# Patient Record
Sex: Female | Born: 1935 | Race: White | Hispanic: No | Marital: Married | Smoking: Never smoker
Health system: Southern US, Community
[De-identification: ages and names within clinical notes are randomized; demographics above are authoritative.]

## PROBLEM LIST (undated history)

## (undated) DIAGNOSIS — G20 Parkinson's disease: Secondary | ICD-10-CM

## (undated) DIAGNOSIS — I82409 Acute embolism and thrombosis of unspecified deep veins of unspecified lower extremity: Secondary | ICD-10-CM

## (undated) DIAGNOSIS — K469 Unspecified abdominal hernia without obstruction or gangrene: Secondary | ICD-10-CM

## (undated) DIAGNOSIS — I10 Essential (primary) hypertension: Secondary | ICD-10-CM

## (undated) DIAGNOSIS — H3500 Unspecified background retinopathy: Secondary | ICD-10-CM

## (undated) DIAGNOSIS — E119 Type 2 diabetes mellitus without complications: Secondary | ICD-10-CM

## (undated) DIAGNOSIS — I4891 Unspecified atrial fibrillation: Secondary | ICD-10-CM

## (undated) DIAGNOSIS — I779 Disorder of arteries and arterioles, unspecified: Secondary | ICD-10-CM

## (undated) DIAGNOSIS — E039 Hypothyroidism, unspecified: Secondary | ICD-10-CM

## (undated) DIAGNOSIS — E7800 Pure hypercholesterolemia, unspecified: Secondary | ICD-10-CM

## (undated) DIAGNOSIS — C801 Malignant (primary) neoplasm, unspecified: Secondary | ICD-10-CM

## (undated) DIAGNOSIS — F0390 Unspecified dementia without behavioral disturbance: Secondary | ICD-10-CM

## (undated) DIAGNOSIS — I739 Peripheral vascular disease, unspecified: Secondary | ICD-10-CM

## (undated) DIAGNOSIS — G20A1 Parkinson's disease without dyskinesia, without mention of fluctuations: Secondary | ICD-10-CM

## (undated) HISTORY — DX: Unspecified abdominal hernia without obstruction or gangrene: K46.9

## (undated) HISTORY — DX: Pure hypercholesterolemia, unspecified: E78.00

## (undated) HISTORY — DX: Malignant (primary) neoplasm, unspecified: C80.1

## (undated) HISTORY — DX: Type 2 diabetes mellitus without complications: E11.9

## (undated) HISTORY — DX: Parkinson's disease: G20

## (undated) HISTORY — DX: Unspecified dementia, unspecified severity, without behavioral disturbance, psychotic disturbance, mood disturbance, and anxiety: F03.90

## (undated) HISTORY — DX: Unspecified background retinopathy: H35.00

## (undated) HISTORY — DX: Essential (primary) hypertension: I10

## (undated) HISTORY — DX: Unspecified atrial fibrillation: I48.91

## (undated) HISTORY — DX: Hypothyroidism, unspecified: E03.9

## (undated) HISTORY — DX: Acute embolism and thrombosis of unspecified deep veins of unspecified lower extremity: I82.409

## (undated) HISTORY — DX: Disorder of arteries and arterioles, unspecified: I77.9

## (undated) HISTORY — DX: Parkinson's disease without dyskinesia, without mention of fluctuations: G20.A1

## (undated) HISTORY — DX: Peripheral vascular disease, unspecified: I73.9

## (undated) HISTORY — PX: EYE SURGERY: SHX253

---
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
## 1974-06-13 HISTORY — PX: CARPAL TUNNEL RELEASE: SHX101

## 1996-06-13 DIAGNOSIS — I82409 Acute embolism and thrombosis of unspecified deep veins of unspecified lower extremity: Secondary | ICD-10-CM

## 1996-06-13 DIAGNOSIS — C801 Malignant (primary) neoplasm, unspecified: Secondary | ICD-10-CM

## 1996-06-13 HISTORY — DX: Malignant (primary) neoplasm, unspecified: C80.1

## 1996-06-13 HISTORY — PX: BREAST SURGERY: SHX581

## 1996-06-13 HISTORY — DX: Acute embolism and thrombosis of unspecified deep veins of unspecified lower extremity: I82.409

## 2005-06-13 HISTORY — PX: CHOLECYSTECTOMY: SHX55

## 2005-09-09 ENCOUNTER — Other Ambulatory Visit: Payer: Self-pay

## 2005-09-10 ENCOUNTER — Inpatient Hospital Stay: Payer: Self-pay | Admitting: Internal Medicine

## 2005-10-06 ENCOUNTER — Ambulatory Visit: Payer: Self-pay | Admitting: Surgery

## 2005-11-08 ENCOUNTER — Ambulatory Visit: Payer: Self-pay | Admitting: Oncology

## 2005-11-22 LAB — CBC WITH DIFFERENTIAL (CANCER CENTER ONLY)
BASO%: 1 % (ref 0.0–2.0)
LYMPH%: 24.7 % (ref 14.0–48.0)
MCV: 90 fL (ref 81–101)
MONO#: 0.9 10*3/uL (ref 0.1–0.9)
MONO%: 9.5 % (ref 0.0–13.0)
Platelets: 204 10*3/uL (ref 145–400)
RDW: 12.8 % (ref 10.5–14.6)
WBC: 9.4 10*3/uL (ref 3.9–10.0)

## 2005-11-22 LAB — PROTIME-INR (CHCC SATELLITE): INR: 1 — ABNORMAL LOW (ref 2.0–3.5)

## 2005-11-25 LAB — COMPREHENSIVE METABOLIC PANEL
ALT: 17 U/L (ref 0–40)
AST: 21 U/L (ref 0–37)
Alkaline Phosphatase: 79 U/L (ref 39–117)
Creatinine, Ser: 0.9 mg/dL (ref 0.40–1.20)
Total Bilirubin: 0.6 mg/dL (ref 0.3–1.2)

## 2005-11-25 LAB — HYPERCOAGULABLE PANEL, COMPREHENSIVE
Anticardiolipin IgA: 22 [APL'U] (ref <13)
Beta-2-Glycoprotein I IgA: 14 U/mL (ref <10)
DRVVT 1:1 Mix: 39.5 secs (ref 26.75–42.95)
DRVVT: 44.7 secs — ABNORMAL HIGH (ref 26.75–42.95)
Protein C, Total: 83 % (ref 63–153)
Protein S Activity: 72 % — ABNORMAL LOW (ref 81–180)

## 2005-11-25 LAB — PROTIME-INR (CHCC SATELLITE): INR: 1.4 — ABNORMAL LOW (ref 2.0–3.5)

## 2005-11-28 LAB — PROTIME-INR (CHCC SATELLITE)

## 2005-11-30 LAB — PROTHROMBIN TIME: Prothrombin Time: 31.4 seconds — ABNORMAL HIGH (ref 11.6–15.2)

## 2005-12-05 LAB — PROTIME-INR (CHCC SATELLITE): INR: 3 (ref 2.0–3.5)

## 2005-12-12 LAB — PROTIME-INR (CHCC SATELLITE)

## 2005-12-26 ENCOUNTER — Ambulatory Visit: Payer: Self-pay | Admitting: Oncology

## 2005-12-27 ENCOUNTER — Ambulatory Visit (HOSPITAL_COMMUNITY): Admission: RE | Admit: 2005-12-27 | Discharge: 2005-12-27 | Payer: Self-pay | Admitting: Oncology

## 2005-12-27 LAB — PROTIME-INR (CHCC SATELLITE)

## 2006-01-05 LAB — CBC WITH DIFFERENTIAL (CANCER CENTER ONLY)
Eosinophils Absolute: 0.6 10*3/uL — ABNORMAL HIGH (ref 0.0–0.5)
LYMPH%: 30.3 % (ref 14.0–48.0)
MCH: 30 pg (ref 26.0–34.0)
MCHC: 32.9 g/dL (ref 32.0–36.0)
MCV: 91 fL (ref 81–101)
MONO%: 9.7 % (ref 0.0–13.0)
Platelets: 210 10*3/uL (ref 145–400)
RDW: 13 % (ref 10.5–14.6)

## 2006-01-05 LAB — PROTIME-INR (CHCC SATELLITE): Protime: 24.3 Seconds — ABNORMAL HIGH (ref 10.6–13.4)

## 2006-01-10 LAB — PROTIME-INR (CHCC SATELLITE)
INR: 3.7 — ABNORMAL HIGH (ref 2.0–3.5)
Protime: 23.2 Seconds — ABNORMAL HIGH (ref 10.6–13.4)

## 2006-01-17 LAB — PROTIME-INR (CHCC SATELLITE)
INR: 1.4 — ABNORMAL LOW (ref 2.0–3.5)
Protime: 14.4 Seconds — ABNORMAL HIGH (ref 10.6–13.4)

## 2006-01-24 LAB — PROTIME-INR (CHCC SATELLITE): INR: 1.9 — ABNORMAL LOW (ref 2.0–3.5)

## 2006-01-31 LAB — PROTIME-INR (CHCC SATELLITE)

## 2006-02-09 ENCOUNTER — Ambulatory Visit: Payer: Self-pay | Admitting: Oncology

## 2006-02-14 LAB — PROTIME-INR (CHCC SATELLITE): INR: 2.4 (ref 2.0–3.5)

## 2006-03-07 LAB — PROTIME-INR (CHCC SATELLITE)
INR: 2.5 (ref 2.0–3.5)
Protime: 30 Seconds — ABNORMAL HIGH (ref 10.6–13.4)

## 2006-03-31 ENCOUNTER — Ambulatory Visit: Payer: Self-pay | Admitting: Oncology

## 2006-04-04 LAB — PROTIME-INR (CHCC SATELLITE): Protime: 33.6 Seconds — ABNORMAL HIGH (ref 10.6–13.4)

## 2006-05-02 LAB — PROTIME-INR (CHCC SATELLITE): Protime: 24 Seconds — ABNORMAL HIGH (ref 10.6–13.4)

## 2006-05-25 ENCOUNTER — Ambulatory Visit: Payer: Self-pay | Admitting: Oncology

## 2006-05-30 LAB — PROTIME-INR (CHCC SATELLITE): Protime: 25.2 Seconds — ABNORMAL HIGH (ref 10.6–13.4)

## 2006-06-27 LAB — PROTIME-INR (CHCC SATELLITE)

## 2006-08-02 ENCOUNTER — Ambulatory Visit: Payer: Self-pay | Admitting: Oncology

## 2006-08-03 LAB — CBC WITH DIFFERENTIAL (CANCER CENTER ONLY)
BASO%: 0.7 % (ref 0.0–2.0)
HCT: 38.8 % (ref 34.8–46.6)
LYMPH#: 2 10*3/uL (ref 0.9–3.3)
MONO#: 0.7 10*3/uL (ref 0.1–0.9)
Platelets: 193 10*3/uL (ref 145–400)
RDW: 12 % (ref 10.5–14.6)
WBC: 7.4 10*3/uL (ref 3.9–10.0)

## 2006-08-03 LAB — CANCER ANTIGEN 27.29: CA 27.29: 13 U/mL (ref 0–39)

## 2006-08-03 LAB — COMPREHENSIVE METABOLIC PANEL
ALT: 12 U/L (ref 0–35)
CO2: 23 mEq/L (ref 19–32)
Chloride: 107 mEq/L (ref 96–112)
Sodium: 140 mEq/L (ref 135–145)
Total Bilirubin: 0.3 mg/dL (ref 0.3–1.2)
Total Protein: 6.8 g/dL (ref 6.0–8.3)

## 2006-08-21 ENCOUNTER — Ambulatory Visit: Payer: Self-pay | Admitting: Oncology

## 2006-09-01 LAB — PROTHROMBIN TIME: Prothrombin Time: 53.9 seconds — ABNORMAL HIGH (ref 11.6–15.2)

## 2006-09-04 LAB — PROTIME-INR (CHCC SATELLITE)

## 2006-09-11 LAB — PROTIME-INR (CHCC SATELLITE)

## 2006-09-28 ENCOUNTER — Ambulatory Visit: Payer: Self-pay | Admitting: Oncology

## 2006-09-29 LAB — PROTIME-INR (CHCC SATELLITE): Protime: 38.4 Seconds — ABNORMAL HIGH (ref 10.6–13.4)

## 2006-10-13 LAB — PROTIME-INR (CHCC SATELLITE): Protime: 27.6 Seconds — ABNORMAL HIGH (ref 10.6–13.4)

## 2006-10-27 LAB — PROTIME-INR (CHCC SATELLITE): INR: 1.9 — ABNORMAL LOW (ref 2.0–3.5)

## 2006-11-10 LAB — PROTIME-INR (CHCC SATELLITE)
INR: 2 (ref 2.0–3.5)
Protime: 24 Seconds — ABNORMAL HIGH (ref 10.6–13.4)

## 2006-11-23 ENCOUNTER — Ambulatory Visit: Payer: Self-pay | Admitting: Oncology

## 2006-11-24 LAB — PROTIME-INR (CHCC SATELLITE)

## 2007-01-18 ENCOUNTER — Ambulatory Visit: Payer: Self-pay | Admitting: Oncology

## 2007-01-22 LAB — COMPREHENSIVE METABOLIC PANEL
Alkaline Phosphatase: 68 U/L (ref 39–117)
BUN: 19 mg/dL (ref 6–23)
CO2: 21 mEq/L (ref 19–32)
Glucose, Bld: 131 mg/dL — ABNORMAL HIGH (ref 70–99)
Total Bilirubin: 0.3 mg/dL (ref 0.3–1.2)

## 2007-01-22 LAB — CBC WITH DIFFERENTIAL (CANCER CENTER ONLY)
BASO#: 0.1 10*3/uL (ref 0.0–0.2)
HCT: 36.3 % (ref 34.8–46.6)
HGB: 12 g/dL (ref 11.6–15.9)
LYMPH#: 2.3 10*3/uL (ref 0.9–3.3)
MONO#: 0.7 10*3/uL (ref 0.1–0.9)
NEUT%: 54.5 % (ref 39.6–80.0)
RBC: 3.97 10*6/uL (ref 3.70–5.32)
WBC: 8.2 10*3/uL (ref 3.9–10.0)

## 2007-01-22 LAB — PROTIME-INR (CHCC SATELLITE)
INR: 2.4 (ref 2.0–3.5)
Protime: 28.8 Seconds — ABNORMAL HIGH (ref 10.6–13.4)

## 2007-01-22 LAB — LACTATE DEHYDROGENASE: LDH: 148 U/L (ref 94–250)

## 2007-01-22 LAB — CANCER ANTIGEN 27.29: CA 27.29: 7 U/mL (ref 0–39)

## 2007-03-02 ENCOUNTER — Ambulatory Visit: Payer: Self-pay | Admitting: Oncology

## 2007-03-06 LAB — PROTIME-INR (CHCC SATELLITE): Protime: 22.8 Seconds — ABNORMAL HIGH (ref 10.6–13.4)

## 2007-03-20 LAB — PROTIME-INR (CHCC SATELLITE): Protime: 33.6 Seconds — ABNORMAL HIGH (ref 10.6–13.4)

## 2007-04-19 ENCOUNTER — Ambulatory Visit: Payer: Self-pay | Admitting: Oncology

## 2007-04-20 LAB — PROTIME-INR (CHCC SATELLITE)
INR: 3.2 (ref 2.0–3.5)
Protime: 38.4 Seconds — ABNORMAL HIGH (ref 10.6–13.4)

## 2007-05-03 LAB — PROTIME-INR (CHCC SATELLITE)
INR: 2.7 (ref 2.0–3.5)
Protime: 32.4 Seconds — ABNORMAL HIGH (ref 10.6–13.4)

## 2007-05-28 LAB — PROTIME-INR (CHCC SATELLITE): INR: 2.8 (ref 2.0–3.5)

## 2007-06-21 ENCOUNTER — Ambulatory Visit: Payer: Self-pay | Admitting: Oncology

## 2007-07-02 LAB — PROTIME-INR (CHCC SATELLITE)

## 2007-07-06 LAB — PROTIME-INR (CHCC SATELLITE): Protime: 18 Seconds — ABNORMAL HIGH (ref 10.6–13.4)

## 2007-07-10 LAB — CBC WITH DIFFERENTIAL (CANCER CENTER ONLY)
BASO#: 0.1 10*3/uL (ref 0.0–0.2)
EOS%: 10.1 % — ABNORMAL HIGH (ref 0.0–7.0)
Eosinophils Absolute: 1 10*3/uL — ABNORMAL HIGH (ref 0.0–0.5)
HCT: 36 % (ref 34.8–46.6)
HGB: 12.1 g/dL (ref 11.6–15.9)
LYMPH#: 3 10*3/uL (ref 0.9–3.3)
MCH: 30.6 pg (ref 26.0–34.0)
MCHC: 33.7 g/dL (ref 32.0–36.0)
NEUT%: 47.6 % (ref 39.6–80.0)

## 2007-07-10 LAB — PROTIME-INR (CHCC SATELLITE)
INR: 2 (ref 2.0–3.5)
Protime: 24 Seconds — ABNORMAL HIGH (ref 10.6–13.4)

## 2007-07-31 LAB — PROTIME-INR (CHCC SATELLITE): INR: 3.6 — ABNORMAL HIGH (ref 2.0–3.5)

## 2007-08-10 ENCOUNTER — Ambulatory Visit: Payer: Self-pay | Admitting: Oncology

## 2007-08-14 LAB — PROTIME-INR (CHCC SATELLITE)
INR: 2.1 (ref 2.0–3.5)
Protime: 25.2 Seconds — ABNORMAL HIGH (ref 10.6–13.4)

## 2007-08-27 ENCOUNTER — Ambulatory Visit: Payer: Self-pay | Admitting: Oncology

## 2007-10-05 ENCOUNTER — Ambulatory Visit: Payer: Self-pay | Admitting: Oncology

## 2007-10-09 LAB — PROTIME-INR (CHCC SATELLITE)
INR: 2.6 (ref 2.0–3.5)
Protime: 31.2 Seconds — ABNORMAL HIGH (ref 10.6–13.4)

## 2007-11-09 LAB — PROTIME-INR (CHCC SATELLITE)

## 2007-11-21 ENCOUNTER — Ambulatory Visit: Payer: Self-pay | Admitting: Oncology

## 2007-12-21 LAB — PROTIME-INR (CHCC SATELLITE): Protime: 26.4 Seconds — ABNORMAL HIGH (ref 10.6–13.4)

## 2007-12-26 ENCOUNTER — Emergency Department: Payer: Self-pay | Admitting: Internal Medicine

## 2008-01-03 ENCOUNTER — Ambulatory Visit: Payer: Self-pay | Admitting: Oncology

## 2008-01-07 LAB — CBC WITH DIFFERENTIAL (CANCER CENTER ONLY)
BASO#: 0.1 10*3/uL (ref 0.0–0.2)
Eosinophils Absolute: 0.4 10*3/uL (ref 0.0–0.5)
HCT: 36.8 % (ref 34.8–46.6)
HGB: 12.4 g/dL (ref 11.6–15.9)
LYMPH#: 2.4 10*3/uL (ref 0.9–3.3)
MCH: 29.6 pg (ref 26.0–34.0)
NEUT#: 3.8 10*3/uL (ref 1.5–6.5)
NEUT%: 52.1 % (ref 39.6–80.0)
RBC: 4.19 10*6/uL (ref 3.70–5.32)

## 2008-01-07 LAB — COMPREHENSIVE METABOLIC PANEL
ALT: 20 U/L (ref 0–35)
AST: 22 U/L (ref 0–37)
Albumin: 3.7 g/dL (ref 3.5–5.2)
Alkaline Phosphatase: 54 U/L (ref 39–117)
BUN: 23 mg/dL (ref 6–23)
Calcium: 9 mg/dL (ref 8.4–10.5)
Chloride: 101 mEq/L (ref 96–112)
Potassium: 4.3 mEq/L (ref 3.5–5.3)
Sodium: 137 mEq/L (ref 135–145)
Total Protein: 7 g/dL (ref 6.0–8.3)

## 2008-01-07 LAB — PROTIME-INR (CHCC SATELLITE): INR: 2.9 (ref 2.0–3.5)

## 2008-02-07 LAB — CBC WITH DIFFERENTIAL (CANCER CENTER ONLY)
BASO#: 0.1 10*3/uL (ref 0.0–0.2)
Eosinophils Absolute: 0.7 10*3/uL — ABNORMAL HIGH (ref 0.0–0.5)
HGB: 12.3 g/dL (ref 11.6–15.9)
LYMPH%: 25 % (ref 14.0–48.0)
MCH: 29.9 pg (ref 26.0–34.0)
MCV: 89 fL (ref 81–101)
MONO%: 8.8 % (ref 0.0–13.0)
NEUT%: 59.5 % (ref 39.6–80.0)
RBC: 4.11 10*6/uL (ref 3.70–5.32)

## 2008-02-07 LAB — PROTIME-INR (CHCC SATELLITE): Protime: 25.2 Seconds — ABNORMAL HIGH (ref 10.6–13.4)

## 2008-03-04 ENCOUNTER — Ambulatory Visit: Payer: Self-pay | Admitting: Oncology

## 2008-03-06 LAB — PROTIME-INR (CHCC SATELLITE): INR: 2.4 (ref 2.0–3.5)

## 2008-04-03 LAB — PROTIME-INR (CHCC SATELLITE)

## 2008-04-30 ENCOUNTER — Ambulatory Visit: Payer: Self-pay | Admitting: Oncology

## 2008-05-01 LAB — PROTIME-INR (CHCC SATELLITE): INR: 2.5 (ref 2.0–3.5)

## 2008-05-29 LAB — PROTIME-INR (CHCC SATELLITE): Protime: 37.2 Seconds — ABNORMAL HIGH (ref 10.6–13.4)

## 2008-06-12 LAB — PROTIME-INR (CHCC SATELLITE)
INR: 2.8 (ref 2.0–3.5)
Protime: 33.6 Seconds — ABNORMAL HIGH (ref 10.6–13.4)

## 2008-06-24 ENCOUNTER — Ambulatory Visit: Payer: Self-pay | Admitting: Oncology

## 2008-08-19 ENCOUNTER — Ambulatory Visit: Payer: Self-pay | Admitting: Oncology

## 2008-08-21 LAB — CBC WITH DIFFERENTIAL (CANCER CENTER ONLY)
BASO#: 0.1 10*3/uL (ref 0.0–0.2)
BASO%: 1.1 % (ref 0.0–2.0)
EOS%: 10.3 % — ABNORMAL HIGH (ref 0.0–7.0)
HCT: 38.2 % (ref 34.8–46.6)
HGB: 12.9 g/dL (ref 11.6–15.9)
LYMPH#: 2.2 10*3/uL (ref 0.9–3.3)
MCHC: 33.6 g/dL (ref 32.0–36.0)
MONO#: 0.8 10*3/uL (ref 0.1–0.9)
NEUT#: 4.9 10*3/uL (ref 1.5–6.5)
WBC: 8.9 10*3/uL (ref 3.9–10.0)

## 2008-08-21 LAB — PROTIME-INR (CHCC SATELLITE)

## 2008-08-21 LAB — CMP (CANCER CENTER ONLY)
ALT(SGPT): 18 U/L (ref 10–47)
AST: 22 U/L (ref 11–38)
CO2: 29 mEq/L (ref 18–33)
Calcium: 9.1 mg/dL (ref 8.0–10.3)
Chloride: 98 mEq/L (ref 98–108)
Sodium: 139 mEq/L (ref 128–145)
Total Protein: 7.6 g/dL (ref 6.4–8.1)

## 2008-08-21 LAB — CANCER ANTIGEN 27.29: CA 27.29: 12 U/mL (ref 0–39)

## 2008-08-27 ENCOUNTER — Ambulatory Visit: Payer: Self-pay | Admitting: Oncology

## 2008-10-22 ENCOUNTER — Ambulatory Visit: Payer: Self-pay | Admitting: Oncology

## 2008-10-23 LAB — PROTIME-INR (CHCC SATELLITE)
INR: 2.2 (ref 2.0–3.5)
Protime: 26.4 Seconds — ABNORMAL HIGH (ref 10.6–13.4)

## 2008-11-20 LAB — PROTIME-INR (CHCC SATELLITE)
INR: 2.2 (ref 2.0–3.5)
Protime: 26.4 Seconds — ABNORMAL HIGH (ref 10.6–13.4)

## 2008-12-11 ENCOUNTER — Ambulatory Visit: Payer: Self-pay | Admitting: Oncology

## 2009-01-13 ENCOUNTER — Ambulatory Visit: Payer: Self-pay | Admitting: Oncology

## 2009-02-10 ENCOUNTER — Ambulatory Visit: Payer: Self-pay | Admitting: Oncology

## 2009-02-12 LAB — CBC WITH DIFFERENTIAL (CANCER CENTER ONLY)
BASO%: 0.9 % (ref 0.0–2.0)
EOS%: 7 % (ref 0.0–7.0)
HCT: 38.2 % (ref 34.8–46.6)
LYMPH#: 2.5 10*3/uL (ref 0.9–3.3)
MCHC: 32.7 g/dL (ref 32.0–36.0)
MONO#: 0.8 10*3/uL (ref 0.1–0.9)
NEUT#: 4.5 10*3/uL (ref 1.5–6.5)
NEUT%: 53.3 % (ref 39.6–80.0)
Platelets: 263 10*3/uL (ref 145–400)
RDW: 12.7 % (ref 10.5–14.6)
WBC: 8.4 10*3/uL (ref 3.9–10.0)

## 2009-02-12 LAB — CMP (CANCER CENTER ONLY)
ALT(SGPT): 20 U/L (ref 10–47)
AST: 21 U/L (ref 11–38)
Albumin: 3.2 g/dL — ABNORMAL LOW (ref 3.3–5.5)
BUN, Bld: 18 mg/dL (ref 7–22)
Calcium: 9 mg/dL (ref 8.0–10.3)
Chloride: 102 mEq/L (ref 98–108)
Potassium: 4.1 mEq/L (ref 3.3–4.7)
Total Protein: 7.4 g/dL (ref 6.4–8.1)

## 2009-02-12 LAB — PROTIME-INR (CHCC SATELLITE): INR: 2.8 (ref 2.0–3.5)

## 2009-03-11 ENCOUNTER — Ambulatory Visit: Payer: Self-pay | Admitting: Oncology

## 2009-03-13 LAB — PROTIME-INR (CHCC SATELLITE): Protime: 30 Seconds — ABNORMAL HIGH (ref 10.6–13.4)

## 2009-04-08 ENCOUNTER — Ambulatory Visit: Payer: Self-pay | Admitting: Oncology

## 2009-04-10 LAB — PROTIME-INR (CHCC SATELLITE): Protime: 36 Seconds — ABNORMAL HIGH (ref 10.6–13.4)

## 2009-05-04 ENCOUNTER — Ambulatory Visit: Payer: Self-pay | Admitting: Oncology

## 2009-05-08 LAB — PROTIME-INR (CHCC SATELLITE)

## 2009-05-29 ENCOUNTER — Ambulatory Visit: Payer: Self-pay | Admitting: Oncology

## 2009-06-04 LAB — PROTIME-INR (CHCC SATELLITE)

## 2009-07-01 ENCOUNTER — Ambulatory Visit: Payer: Self-pay | Admitting: Oncology

## 2009-07-03 LAB — PROTIME-INR (CHCC SATELLITE): INR: 2 (ref 2.0–3.5)

## 2009-08-10 ENCOUNTER — Ambulatory Visit: Payer: Self-pay | Admitting: Oncology

## 2009-08-13 LAB — CBC WITH DIFFERENTIAL (CANCER CENTER ONLY)
BASO#: 0.1 10*3/uL (ref 0.0–0.2)
EOS%: 5.3 % (ref 0.0–7.0)
Eosinophils Absolute: 0.4 10*3/uL (ref 0.0–0.5)
HGB: 12.4 g/dL (ref 11.6–15.9)
LYMPH#: 2.3 10*3/uL (ref 0.9–3.3)
MONO#: 0.7 10*3/uL (ref 0.1–0.9)
NEUT#: 4.2 10*3/uL (ref 1.5–6.5)
RBC: 4.17 10*6/uL (ref 3.70–5.32)

## 2009-08-13 LAB — CMP (CANCER CENTER ONLY)
ALT(SGPT): 25 U/L (ref 10–47)
AST: 24 U/L (ref 11–38)
Calcium: 9.1 mg/dL (ref 8.0–10.3)
Chloride: 104 mEq/L (ref 98–108)
Creat: 0.7 mg/dl (ref 0.6–1.2)
Potassium: 4.1 mEq/L (ref 3.3–4.7)
Sodium: 145 mEq/L (ref 128–145)

## 2009-08-13 LAB — PROTIME-INR (CHCC SATELLITE): INR: 2.4 (ref 2.0–3.5)

## 2009-09-02 ENCOUNTER — Ambulatory Visit: Payer: Self-pay | Admitting: Internal Medicine

## 2010-04-16 ENCOUNTER — Encounter: Admission: RE | Admit: 2010-04-16 | Discharge: 2010-04-16 | Payer: Self-pay | Admitting: Diagnostic Neuroimaging

## 2010-09-04 ENCOUNTER — Emergency Department: Payer: Self-pay | Admitting: Emergency Medicine

## 2010-10-06 ENCOUNTER — Ambulatory Visit: Payer: Self-pay | Admitting: Internal Medicine

## 2011-02-23 ENCOUNTER — Ambulatory Visit: Payer: Self-pay | Admitting: Cardiovascular Disease

## 2011-08-28 ENCOUNTER — Emergency Department: Payer: Self-pay | Admitting: Emergency Medicine

## 2011-11-08 ENCOUNTER — Ambulatory Visit: Payer: Self-pay | Admitting: Internal Medicine

## 2012-03-20 ENCOUNTER — Ambulatory Visit: Payer: Self-pay | Admitting: Internal Medicine

## 2012-03-30 ENCOUNTER — Other Ambulatory Visit: Payer: Self-pay | Admitting: Neurosurgery

## 2012-03-30 DIAGNOSIS — M48061 Spinal stenosis, lumbar region without neurogenic claudication: Secondary | ICD-10-CM

## 2012-04-03 ENCOUNTER — Ambulatory Visit
Admission: RE | Admit: 2012-04-03 | Discharge: 2012-04-03 | Disposition: A | Discharge status: Home / Self Care | Payer: Medicare Other | Source: Ambulatory Visit | Attending: Neurosurgery | Admitting: Neurosurgery

## 2012-04-03 DIAGNOSIS — M48061 Spinal stenosis, lumbar region without neurogenic claudication: Secondary | ICD-10-CM

## 2012-04-06 ENCOUNTER — Other Ambulatory Visit: Payer: Self-pay | Admitting: Neurosurgery

## 2012-04-06 DIAGNOSIS — M48061 Spinal stenosis, lumbar region without neurogenic claudication: Secondary | ICD-10-CM

## 2012-04-20 ENCOUNTER — Ambulatory Visit
Admission: RE | Admit: 2012-04-20 | Discharge: 2012-04-20 | Disposition: A | Discharge status: Home / Self Care | Payer: Medicare Other | Source: Ambulatory Visit | Attending: Neurosurgery | Admitting: Neurosurgery

## 2012-04-20 DIAGNOSIS — M48061 Spinal stenosis, lumbar region without neurogenic claudication: Secondary | ICD-10-CM

## 2012-04-20 MED ORDER — DIAZEPAM 5 MG PO TABS
5.0000 mg | ORAL_TABLET | Freq: Once | ORAL | Status: AC
  Administered 2012-04-20: 5 mg via ORAL

## 2012-04-20 MED ORDER — ONDANSETRON HCL 4 MG/2ML IJ SOLN: 4.0000 mg | Freq: Four times a day (QID) | INTRAMUSCULAR | Status: DC | PRN

## 2012-04-20 MED ORDER — IOHEXOL 180 MG/ML  SOLN
18.0000 mL | Freq: Once | INTRAMUSCULAR | Status: AC | PRN
  Administered 2012-04-20: 18 mL via INTRATHECAL

## 2012-04-20 NOTE — Progress Notes (Signed)
INR 1.07 per daughter; drawn yesterday by Dr. Santo Held office [facility]).

## 2012-04-20 NOTE — Progress Notes (Signed)
Daughter states patient has been off Tramadol and Venlafaxine since Tuesday (04/17/12) and the Coumadin for four days.  jkl

## 2012-06-13 HISTORY — PX: BREAST SURGERY: SHX581

## 2012-06-30 ENCOUNTER — Emergency Department: Payer: Self-pay | Admitting: Emergency Medicine

## 2012-06-30 LAB — CBC
HGB: 11.7 g/dL — ABNORMAL LOW (ref 12.0–16.0)
MCH: 30.1 pg (ref 26.0–34.0)
MCHC: 32.5 g/dL (ref 32.0–36.0)
MCV: 93 fL (ref 80–100)
Platelet: 221 10*3/uL (ref 150–440)
WBC: 7.1 10*3/uL (ref 3.6–11.0)

## 2012-06-30 LAB — COMPREHENSIVE METABOLIC PANEL
Albumin: 2.9 g/dL — ABNORMAL LOW (ref 3.4–5.0)
Alkaline Phosphatase: 91 U/L (ref 50–136)
Anion Gap: 7 (ref 7–16)
BUN: 13 mg/dL (ref 7–18)
Calcium, Total: 8.1 mg/dL — ABNORMAL LOW (ref 8.5–10.1)
Chloride: 102 mmol/L (ref 98–107)
Creatinine: 0.98 mg/dL (ref 0.60–1.30)
EGFR (African American): >60
Glucose: 127 mg/dL — ABNORMAL HIGH (ref 65–99)
Osmolality: 277 (ref 275–301)
SGOT(AST): 31 U/L (ref 15–37)
SGPT (ALT): 28 U/L (ref 12–78)
Sodium: 138 mmol/L (ref 136–145)
Total Protein: 7.2 g/dL (ref 6.4–8.2)

## 2012-06-30 LAB — CK TOTAL AND CKMB (NOT AT ARMC): CK-MB: 1.5 ng/mL (ref 0.5–3.6)

## 2012-06-30 LAB — TROPONIN I: Troponin-I: <0.02 ng/mL

## 2012-06-30 LAB — RAPID INFLUENZA A&B ANTIGENS

## 2012-10-19 ENCOUNTER — Other Ambulatory Visit: Payer: Self-pay

## 2012-10-19 MED ORDER — ROPINIROLE HCL 1 MG PO TABS: 2.0000 mg | ORAL_TABLET | Freq: Three times a day (TID) | ORAL | Status: DC

## 2012-10-30 ENCOUNTER — Emergency Department: Payer: Self-pay | Admitting: Emergency Medicine

## 2012-11-22 ENCOUNTER — Ambulatory Visit: Payer: Self-pay | Admitting: Internal Medicine

## 2012-11-27 ENCOUNTER — Telehealth: Payer: Self-pay | Admitting: *Deleted

## 2012-11-27 NOTE — Telephone Encounter (Signed)
Spoke with pt's daughter and informed her that we would be able to refill her mother's Rx as long as she had a scheduled appointment.  I gave her an appointment date of Mar 13, 2013 at 1:30 PM.

## 2012-11-27 NOTE — Telephone Encounter (Signed)
Message copied by Avie Echevaria on Tue Nov 27, 2012  4:57 PM ------      Message from: Anice Paganini      Created: Tue Nov 27, 2012 10:45 AM      Contact: pt's daughter Talbert Forest [phone]       Pt's daughter Talbert Forest called states pt needs to be seen I did put her on the waiting list and that she will run of out her rOPINIRole (REQUIP) 1 MG tablet this coming Thursday, she needs a refill but the pharmacist will not call her back so daughter is wanting to see if Dr. Marjory Lies can call her in some refills and she needs to get on the schedule to come in to see Dr. Marjory Lies. Thanks  ------

## 2012-11-28 ENCOUNTER — Other Ambulatory Visit: Payer: Self-pay

## 2012-11-28 MED ORDER — ROPINIROLE HCL 1 MG PO TABS: 2.0000 mg | ORAL_TABLET | Freq: Three times a day (TID) | ORAL | Status: DC

## 2012-11-30 ENCOUNTER — Ambulatory Visit: Payer: Self-pay | Admitting: Internal Medicine

## 2012-12-12 ENCOUNTER — Ambulatory Visit (INDEPENDENT_AMBULATORY_CARE_PROVIDER_SITE_OTHER): Payer: Medicare Other | Admitting: General Surgery

## 2012-12-12 ENCOUNTER — Encounter: Payer: Self-pay | Admitting: General Surgery

## 2012-12-12 VITALS — BP 134/64 | HR 88 | Resp 16 | Ht 62.0 in | Wt 197.0 lb

## 2012-12-12 DIAGNOSIS — N63 Unspecified lump in unspecified breast: Secondary | ICD-10-CM

## 2012-12-12 DIAGNOSIS — N6310 Unspecified lump in the right breast, unspecified quadrant: Secondary | ICD-10-CM

## 2012-12-12 NOTE — Patient Instructions (Addendum)

## 2012-12-12 NOTE — Progress Notes (Addendum)
Patient ID: Kristin Frey, female   DOB: [birth year], 79 y.o.   MRN: [id number]  Chief Complaint  Patient presents with  . Other    abnormal mammogram    HPI Kristin Frey is a 79 y.o. female who presents for a breast evaluation. The most recent mammogram was done on November 22 2012.  She was born in [place] a, moving to the [place] in 1950. Patient does perform regular self breast checks and gets regular mammograms done.  Patient with known history of breast cancer. She had left breast mastectomy with chemo and radiation completed in [place].  Patient states she can feel "somthing there" in the right breast since her recent mammogram raised a question about this area. She has been in [place] for 7 years. She has had right leg swelling about 6 months ago, and at that time underwent evaluation in the ED. She reports a venous ultrasound was completed but noclot was identified. The swelling has subsequently resolved.  She's been maintained on Coumadin because of a history of DVT in the distant past while taking chemotherapy for left breast cancer as well as carotid stenoses.  She is accompanied today by her daughter.  HPI  Past Medical History  Diagnosis Date  . Parkinson disease   . Carotid artery disease   . Cancer 1998    left breast mastectomy chemo and radiation  . Hernia   . Retinopathy   . DVT (deep venous thrombosis) 1998    Past Surgical History  Procedure Laterality Date  . Appendectomy  1949  . Carpal tunnel release Bilateral 1976  . Eye surgery  [phone]  . Breast surgery Left 1998    mastectomy  . Cholecystectomy  2007    No family history on file.  Social History History  Substance Use Topics  . Smoking status: Never Smoker   . Smokeless tobacco: Never Used  . Alcohol Use: No    Allergies  Allergen Reactions  . Penicillins Anaphylaxis  . Fish Allergy Hives    Current Outpatient Prescriptions  Medication Sig Dispense Refill  .  cyclobenzaprine (FLEXERIL) 10 MG tablet Take 1 tablet by mouth daily.      . enalapril (VASOTEC) 10 MG tablet       . levothyroxine (SYNTHROID, LEVOTHROID) 50 MCG tablet Take 1 tablet by mouth daily.      Marland Kitchen Linaclotide (LINZESS) 145 MCG CAPS Take by mouth daily.      . meclizine (ANTIVERT) 12.5 MG tablet Take 1 tablet by mouth daily at 6 (six) AM.      . omeprazole (PRILOSEC) 40 MG capsule Take 1 capsule by mouth daily.      . predniSONE (DELTASONE) 50 MG tablet Take 1 tablet by mouth as directed.      Marland Kitchen rOPINIRole (REQUIP) 1 MG tablet Take 2 tablets (2 mg total) by mouth 3 (three) times daily.  180 tablet  3  . senna (SENOKOT) 8.6 MG tablet Take 1 tablet by mouth as needed for constipation.      . simvastatin (ZOCOR) 40 MG tablet Take 1 tablet by mouth daily.      . traMADol (ULTRAM) 50 MG tablet Take 1 tablet by mouth as needed.      . venlafaxine XR (EFFEXOR-XR) 75 MG 24 hr capsule Take 1 capsule by mouth daily.      Marland Kitchen warfarin (COUMADIN) 2 MG tablet Take 2 mg by mouth daily.      Marland Kitchen warfarin (COUMADIN) 5 MG tablet  Take 5 mg by mouth daily.      Marland Kitchen warfarin (COUMADIN) 6 MG tablet Take 1 tablet by mouth 3 (three) times a week.        No current facility-administered medications for this visit.    Review of Systems Review of Systems  Constitutional: Negative.   Respiratory: Negative.   Cardiovascular: Negative.     Blood pressure 134/64, pulse 88, resp. rate 16, height 5\' 2"  (1.575 m), weight 197 lb (89.359 kg).  Physical Exam Physical Exam  Constitutional: She is oriented to person, place, and time. She appears well-developed and well-nourished.  Cardiovascular: Normal rate, regular rhythm and normal heart sounds.   Pulses:      Carotid pulses are 2+ on the right side, and 2+ on the left side. Pulmonary/Chest: Effort normal and breath sounds normal. Right breast exhibits mass. Right breast exhibits no inverted nipple, no nipple discharge, no skin change and no tenderness.  Left  chest wall extensive telangiectasia over the area of postmastectomy radiation. A well healed mastectomy scar is evident.  Right breast nodule, 2 cm in diameter and noted 10 CFN at the 9:00 position.  Lymphadenopathy:    She has no cervical adenopathy.    She has no axillary adenopathy.  Neurological: She is alert and oriented to person, place, and time.  Skin: Skin is warm and dry.    Data Reviewed Primary care provider and notes dated Nov 01, 2012 were reviewed.  Right breast mammogram dated November 22, 2012 showed a focal asymmetric density in the lateral right breast. BI-RAD-0.  Focal spot compression views and ultrasound dated November 30, 2012 showed an irregular mass of the gynecologic position showed an ill-defined 1.9 x 2.9 x 2.9 cm mass with posterior acoustic shadowing. BI-RAD-4.  Ultrasound examination of the right breast 11:00 position confirmed an irregular hypoechoic mass with focal acoustic shadowing measuring 1.5 x 2.0 x 2.2 cm. The patient was amenable to core biopsy. The risks of the procedure in light of her ongoing anticoagulation was discussed with both the patient and her daughter. 10 cc of 0.5% Xylocaine with 0.25% Marcaine with one 200,000 of epinephrine was utilized a well-tolerated. ChloraPrep was applied to the skin. A 14-gauge Bard device was used to obtain multiple cores from the lesion. Scant bleeding was noted. The skin defect was closed with benzoin and Steri-Strips followed by Telfa Tegaderm dressing. Written instructions were provided.  Assessment    Right breast cancer.     Plan    Written instructions were provided to the patient and her daughter. She will be contacted when the pathology reports are available.  Considering the late hour, this will likely be on Monday, December 17, 2012. The patient is aware that the mass is most likely related to a new malignancy of the breast.        [name] 12/12/2012, 8:44 PM

## 2012-12-17 ENCOUNTER — Telehealth: Payer: Self-pay | Admitting: General Surgery

## 2012-12-17 NOTE — Telephone Encounter (Signed)
Daughter notified pathology confirmed clinical suspicion of cancer. Appt tomorrow to review options.

## 2012-12-18 ENCOUNTER — Other Ambulatory Visit: Payer: Self-pay

## 2012-12-18 ENCOUNTER — Encounter: Payer: Self-pay | Admitting: General Surgery

## 2012-12-18 ENCOUNTER — Ambulatory Visit (INDEPENDENT_AMBULATORY_CARE_PROVIDER_SITE_OTHER): Payer: Medicare Other | Admitting: General Surgery

## 2012-12-18 VITALS — BP 126/68 | HR 78 | Resp 16 | Ht 61.0 in | Wt 195.0 lb

## 2012-12-18 DIAGNOSIS — C50911 Malignant neoplasm of unspecified site of right female breast: Secondary | ICD-10-CM

## 2012-12-18 DIAGNOSIS — C50919 Malignant neoplasm of unspecified site of unspecified female breast: Secondary | ICD-10-CM

## 2012-12-18 NOTE — Progress Notes (Signed)
Dressing removed, steristrip in place and aware it may come off in one week.  Minimal bruising noted.  The patient is aware that a heating pad may be used for comfort as needed.     The majority of today's visit was spent reviewing her options after recent confirmation of a new right breast cancer. Options for management were reviewed: 1) wide excision, sentinel node biopsy and possible axillary dissection with postoperative radiation versus 2) simple mastectomy with sentinel node biopsy/axilla dissection without radiation were presented as equal options. The patient had previously undergone a contralateral mastectomy with chemotherapy and radiation for a stage II, 4-5 cm tumor (by her daughter's report). 4 of 8 nodes reported positive at that time). Pros and cons of conservative therapy with radiation (transportation issues/whole breast versus partial breast radiation) as opposed to mastectomy were discussed.  Laboratory studies were obtained today. If these are normal would not investigate further for distant metastatic spread unless extensive nodal disease was identified.  Opportunity for second surgical opinion was offered.  The family will consider their options and notify the office of how they would like to proceed.

## 2012-12-19 LAB — COMPREHENSIVE METABOLIC PANEL
AST: 33 IU/L (ref 0–40)
Albumin: 3.6 g/dL (ref 3.5–4.7)
BUN: 21 mg/dL (ref 8–27)
CO2: 28 mmol/L (ref 18–29)
Calcium: 9.1 mg/dL (ref 8.6–10.2)
Creatinine, Ser: 1.01 mg/dL — ABNORMAL HIGH (ref 0.57–1.00)
Globulin, Total: 3.4 g/dL (ref 1.5–4.5)

## 2012-12-19 LAB — CBC WITH DIFFERENTIAL/PLATELET
Basos: 0 % (ref 0–3)
Eos: 7 % — ABNORMAL HIGH (ref 0–5)
Eosinophils Absolute: 0.8 10*3/uL — ABNORMAL HIGH (ref 0.0–0.4)
Hemoglobin: 12.4 g/dL (ref 11.1–15.9)
Lymphs: 22 % (ref 14–46)
MCH: 29 pg (ref 26.6–33.0)
MCV: 90 fL (ref 79–97)
Neutrophils Absolute: 7.6 10*3/uL — ABNORMAL HIGH (ref 1.4–7.0)
RBC: 4.27 x10E6/uL (ref 3.77–5.28)

## 2012-12-19 LAB — CEA: CEA: 1.9 ng/mL (ref 0.0–4.7)

## 2012-12-19 LAB — CANCER ANTIGEN 27.29: CA 27.29: 15.9 U/mL (ref 0.0–38.6)

## 2012-12-24 ENCOUNTER — Telehealth: Payer: Self-pay | Admitting: *Deleted

## 2012-12-24 ENCOUNTER — Other Ambulatory Visit: Payer: Self-pay | Admitting: General Surgery

## 2012-12-24 DIAGNOSIS — C50919 Malignant neoplasm of unspecified site of unspecified female breast: Secondary | ICD-10-CM | POA: Insufficient documentation

## 2012-12-24 DIAGNOSIS — C50911 Malignant neoplasm of unspecified site of right female breast: Secondary | ICD-10-CM

## 2012-12-24 NOTE — Telephone Encounter (Signed)
Per daughter, patient has decided that she would like a lumpectomy with radiation. Please complete surgery sheet. Patient would also like results from lab work. Thanks.

## 2012-12-24 NOTE — Telephone Encounter (Signed)
Message to call the office.  Patient's surgery has been scheduled for 01-02-13 at [facility]. Please do orders for pre-admit appointment on 12-26-12 at 9 am. Thanks.

## 2012-12-25 ENCOUNTER — Telehealth: Payer: Self-pay | Admitting: *Deleted

## 2012-12-25 NOTE — Telephone Encounter (Signed)
Patient's daughter was notified of pre-admission appointment and surgery date and time. She will call the office if they have further questions.

## 2012-12-26 ENCOUNTER — Ambulatory Visit: Payer: Self-pay | Admitting: General Surgery

## 2012-12-31 LAB — PATHOLOGY

## 2013-01-02 ENCOUNTER — Ambulatory Visit: Payer: Self-pay | Admitting: General Surgery

## 2013-01-02 DIAGNOSIS — C50419 Malignant neoplasm of upper-outer quadrant of unspecified female breast: Secondary | ICD-10-CM

## 2013-01-02 LAB — PROTIME-INR: INR: 1

## 2013-01-03 ENCOUNTER — Encounter: Payer: Self-pay | Admitting: General Surgery

## 2013-01-04 LAB — PATHOLOGY REPORT

## 2013-01-07 ENCOUNTER — Encounter: Payer: Self-pay | Admitting: General Surgery

## 2013-01-07 ENCOUNTER — Ambulatory Visit (INDEPENDENT_AMBULATORY_CARE_PROVIDER_SITE_OTHER): Payer: Medicare Other | Admitting: General Surgery

## 2013-01-07 VITALS — BP 144/70 | HR 78 | Resp 14 | Ht 61.0 in | Wt 242.0 lb

## 2013-01-07 DIAGNOSIS — C50911 Malignant neoplasm of unspecified site of right female breast: Secondary | ICD-10-CM

## 2013-01-07 DIAGNOSIS — C50919 Malignant neoplasm of unspecified site of unspecified female breast: Secondary | ICD-10-CM

## 2013-01-07 NOTE — Progress Notes (Signed)
Patient ID: Kristin Frey, female   DOB: 11/08/1929, 79 y.o.   MRN: [id number]  Chief Complaint  Patient presents with  . Routine Post Op    HPI Kristin Frey is a 79 y.o. female here today for her post op right breast wide excision and sentinel node biopsy completed 01/02/13. Patient states she is doing well. She reports minimal postoperative pain, and no narcotic requirements. She tolerated her compressive wrap fairly well.  HPI  Past Medical History  Diagnosis Date  . Parkinson disease   . Carotid artery disease   . Cancer 1998    left breast mastectomy chemo and radiation  . Hernia   . Retinopathy   . DVT (deep venous thrombosis) 1998    Past Surgical History  Procedure Laterality Date  . Appendectomy  1949  . Carpal tunnel release Bilateral 1976  . Eye surgery  [phone]  . Cholecystectomy  2007  . Breast surgery Left 1998    mastectomy  . Breast surgery Right 2014    wide excision    No family history on file.  Social History History  Substance Use Topics  . Smoking status: Never Smoker   . Smokeless tobacco: Never Used  . Alcohol Use: No    Allergies  Allergen Reactions  . Penicillins Anaphylaxis  . Fish Allergy Hives    Current Outpatient Prescriptions  Medication Sig Dispense Refill  . cyclobenzaprine (FLEXERIL) 10 MG tablet Take 1 tablet by mouth daily.      . enalapril (VASOTEC) 10 MG tablet       . levothyroxine (SYNTHROID, LEVOTHROID) 50 MCG tablet Take 1 tablet by mouth daily.      Marland Kitchen Linaclotide (LINZESS) 145 MCG CAPS Take by mouth daily.      . meclizine (ANTIVERT) 12.5 MG tablet Take 1 tablet by mouth daily at 6 (six) AM.      . omeprazole (PRILOSEC) 40 MG capsule Take 1 capsule by mouth daily.      . predniSONE (DELTASONE) 50 MG tablet Take 1 tablet by mouth as directed.      Marland Kitchen rOPINIRole (REQUIP) 1 MG tablet Take 2 tablets (2 mg total) by mouth 3 (three) times daily.  180 tablet  3  . senna (SENOKOT) 8.6 MG tablet Take 1 tablet by mouth as  needed for constipation.      . simvastatin (ZOCOR) 40 MG tablet Take 1 tablet by mouth daily.      . traMADol (ULTRAM) 50 MG tablet Take 1 tablet by mouth as needed.      . venlafaxine XR (EFFEXOR-XR) 75 MG 24 hr capsule Take 1 capsule by mouth daily.      Marland Kitchen warfarin (COUMADIN) 2 MG tablet Take 2 mg by mouth daily.      Marland Kitchen warfarin (COUMADIN) 5 MG tablet Take 5 mg by mouth daily.      Marland Kitchen warfarin (COUMADIN) 6 MG tablet Take 1 tablet by mouth 3 (three) times a week.        No current facility-administered medications for this visit.    Review of Systems Review of Systems  Constitutional: Negative.   Respiratory: Negative.   Cardiovascular: Negative.     Blood pressure 144/70, pulse 78, resp. rate 14, height 5\' 1"  (1.549 m), weight 242 lb (109.77 kg).  Physical Exam Physical Exam  Constitutional: She is oriented to person, place, and time. She appears well-developed and well-nourished.  Pulmonary/Chest:  Right breast wide excision looks clean and healing well.  Neurological: She is  alert and oriented to person, place, and time.  Skin: Skin is warm and dry.    Data Reviewed Pathology showed an estimated tumor diameter of 2 cm. Invasive mammary cancer of no specific type was identified. A mucinous component was appreciated. DCIS was felt to account for 20% of the tumor. A superior margin was focally involved by DCIS. No invasive cancer was present at any margin. Sentinel lymph node was negative. Histologic grade 1.ER 90%, PR 90%, HER-2/neu not over expressed. The closest margin for invasive cancer was 6 mm.  Ultrasound examination of the breast was completed to help assess for the patient's eligibility to accelerated partial breast radiation. This shows a small seroma cavity measuring 2.3 cm from the skin in the central portion below the wide excision site.  Assessment    T1c,N0 carcinoma of the right breast. Microscopically positive margin for DCIS.    Plan    I think the  likelihood that the patient would benefit from reexcision of this positive margin is small. Considering the generally favorable histology, I think evaluation by radiation oncology prior to any consideration for reexcision is appropriate.  The pathology report was reviewed with the patient and her daughter.  The patient is a candidate for partial breast radiation based on her anatomy.    Patient has been scheduled for an appointment to see Dr. Carmina Miller at the [facility] on 01-09-2013 at 9:30 am. She is aware of date, time, and instructions.    [name] 01/07/2013, 9:35 PM

## 2013-01-07 NOTE — Patient Instructions (Addendum)
Follow up appointment to be announced.  Patient has been scheduled for an appointment to see Dr. Carmina Miller at the [facility] on 01-09-2013 at 9:30 am. She is aware of date, time, and instructions.

## 2013-01-08 ENCOUNTER — Encounter: Payer: Self-pay | Admitting: General Surgery

## 2013-01-09 ENCOUNTER — Telehealth: Payer: Self-pay | Admitting: *Deleted

## 2013-01-09 ENCOUNTER — Ambulatory Visit: Payer: Self-pay | Admitting: Radiation Oncology

## 2013-01-09 DIAGNOSIS — C50919 Malignant neoplasm of unspecified site of unspecified female breast: Secondary | ICD-10-CM

## 2013-01-09 MED ORDER — CEPHALEXIN 500 MG PO CAPS: 500.0000 mg | ORAL_CAPSULE | Freq: Three times a day (TID) | ORAL | Status: DC

## 2013-01-09 MED ORDER — SULFAMETHOXAZOLE-TMP DS 800-160 MG PO TABS: 1.0000 | ORAL_TABLET | Freq: Two times a day (BID) | ORAL | Status: DC

## 2013-01-09 NOTE — Telephone Encounter (Signed)
Patient is a candidate for right mammosite

## 2013-01-09 NOTE — Telephone Encounter (Signed)
Discussed in detail with daughter

## 2013-01-09 NOTE — Telephone Encounter (Signed)
Mammosite schedule to be reviewed with the patient Placement  01-16-13 at [facility] at 11:00 Scan 01-18-13 Treat 01-21-13 to 01-25-13 Aware Toniann Fail will be calling her for more details Aware of ATB and directions reviewed. Aware no showers and to wear her bra while mammosite in place.

## 2013-01-11 ENCOUNTER — Ambulatory Visit: Payer: Self-pay | Admitting: Radiation Oncology

## 2013-01-11 ENCOUNTER — Encounter: Payer: Self-pay | Admitting: General Surgery

## 2013-01-16 ENCOUNTER — Ambulatory Visit (INDEPENDENT_AMBULATORY_CARE_PROVIDER_SITE_OTHER): Payer: Medicare Other | Admitting: General Surgery

## 2013-01-16 ENCOUNTER — Ambulatory Visit: Payer: Medicare Other | Admitting: General Surgery

## 2013-01-16 ENCOUNTER — Encounter: Payer: Self-pay | Admitting: General Surgery

## 2013-01-16 VITALS — BP 114/68 | HR 80 | Resp 16 | Ht 60.0 in | Wt 202.0 lb

## 2013-01-16 DIAGNOSIS — C50919 Malignant neoplasm of unspecified site of unspecified female breast: Secondary | ICD-10-CM

## 2013-01-16 DIAGNOSIS — C50911 Malignant neoplasm of unspecified site of right female breast: Secondary | ICD-10-CM

## 2013-01-16 NOTE — Patient Instructions (Addendum)
Patient care kit given to patient.  Instructed no showers, sponge bath while mammosite in place, take antibiotic. Follow up with Cancer Center as arranged. Discussed wearing your bra for support at all times.  Start coumadin on Thursday Have coumadin levels checked by Dr Doylene Canning on Thursday 14 2014

## 2013-01-16 NOTE — Progress Notes (Signed)
Patient ID: Kristin Frey, female   DOB: 1929/07/07, 79 y.o.   MRN: [id number]  Chief Complaint  Patient presents with  . Procedure    Right breast mammosite placement    HPI Kristin Frey is a 79 y.o. female who presents for a right breast mammosite placement procedure.    HPI  Past Medical History  Diagnosis Date  . Parkinson disease   . Carotid artery disease   . Cancer 1998    left breast mastectomy chemo and radiation  . Hernia   . Retinopathy   . DVT (deep venous thrombosis) 1998    Past Surgical History  Procedure Laterality Date  . Appendectomy  1949  . Carpal tunnel release Bilateral 1976  . Eye surgery  [phone]  . Cholecystectomy  2007  . Breast surgery Left 1998    mastectomy  . Breast surgery Right 2014    wide excision    History reviewed. No pertinent family history.  Social History History  Substance Use Topics  . Smoking status: Never Smoker   . Smokeless tobacco: Never Used  . Alcohol Use: No    Allergies  Allergen Reactions  . Penicillins Anaphylaxis  . Fish Allergy Hives    Current Outpatient Prescriptions  Medication Sig Dispense Refill  . cyclobenzaprine (FLEXERIL) 10 MG tablet Take 1 tablet by mouth daily.      . enalapril (VASOTEC) 10 MG tablet       . levothyroxine (SYNTHROID, LEVOTHROID) 50 MCG tablet Take 1 tablet by mouth daily.      Marland Kitchen Linaclotide (LINZESS) 145 MCG CAPS Take by mouth daily.      . meclizine (ANTIVERT) 12.5 MG tablet Take 1 tablet by mouth daily at 6 (six) AM.      . omeprazole (PRILOSEC) 40 MG capsule Take 1 capsule by mouth daily.      . predniSONE (DELTASONE) 50 MG tablet Take 1 tablet by mouth as directed.      Marland Kitchen rOPINIRole (REQUIP) 1 MG tablet Take 2 tablets (2 mg total) by mouth 3 (three) times daily.  180 tablet  3  . senna (SENOKOT) 8.6 MG tablet Take 1 tablet by mouth as needed for constipation.      . simvastatin (ZOCOR) 40 MG tablet Take 1 tablet by mouth daily.      Marland Kitchen sulfamethoxazole-trimethoprim  (BACTRIM DS) 800-160 MG per tablet Take 1 tablet by mouth 2 (two) times daily. Start the morning of the procedure 01-16-13  20 tablet  0  . traMADol (ULTRAM) 50 MG tablet Take 1 tablet by mouth as needed.      . venlafaxine XR (EFFEXOR-XR) 75 MG 24 hr capsule Take 1 capsule by mouth daily.      Marland Kitchen warfarin (COUMADIN) 2 MG tablet Take 2 mg by mouth daily.      Marland Kitchen warfarin (COUMADIN) 5 MG tablet Take 5 mg by mouth daily.      Marland Kitchen warfarin (COUMADIN) 6 MG tablet Take 1 tablet by mouth 3 (three) times a week.        No current facility-administered medications for this visit.    Review of Systems Review of Systems  Constitutional: Negative.   Respiratory: Negative.   Cardiovascular: Negative.     Blood pressure 114/68, pulse 80, resp. rate 16, height 5' (1.524 m), weight 202 lb (91.627 kg).  Physical Exam Physical Exam  Data Reviewed Dr. Kipp Laurence notes.   Assessment    Candidate for accelerated breast radiation.     Plan  The patient has been found to be an acceptable candidate for Mammosite radiation treatment for her recently diagnosed breast cancer of the right breast after consultation with Carmina Miller, M.D. from radiation oncology.  The patient returns today for planned insertion of a treatment balloon.  Pre-procedure ultrasound has shown an adequate buffer between the skin and the underlying cavity from her wide excision.  A total of 10 cc's of 0.5% Xylocaine with 0.25% Marcaine with 1:200,000 units of epinephrine with H2CO3 was used for local anesthesia and was well tolerated.  The breast was then prepped with Betadine solution times three and draped.  Under ultrasound guidance the 8 mm trocar was inserted into the previous lumpectomy cavity followed by placement of a 4-5 cm spherical Mammosite balloon.  This was inflated to a volume of 45 ccs with normal saline mixed with Omnipaque. The distance from the skin to the balloon is 1.9 cm. Measured on: Friday, January 18, 2013.    The procedure was well-tolerated.  Scant bleeding was noted.  Antibiotic cream was placed at the insertion site and a gauze pad applied.  The patient will make use of Duricef 500 mg p.o. b.i.d. until the treatment balloon is removed by the radiation oncology staff..    She has an appointment with the radiation oncology staff for a CT scan of the breast to confirm balloon size and skin margins on Friday, January 18, 2013.        Kristin Frey 01/18/2013, 5:05 PM

## 2013-01-21 ENCOUNTER — Encounter: Payer: Self-pay | Admitting: General Surgery

## 2013-01-22 LAB — HER-2 / NEU, FISH
Avg Num CEP17 probes/nucleus:: 1.8
Avg Num Her-2 signals/nucleus:: 2.1
HER-2/CEP17 Ratio: 1.15
Number of Observers:: 2
Number of Tumor Cells Counted:: 40

## 2013-01-22 LAB — ER/PR,IMMUNOHISTOCHEM,PARAFFIN

## 2013-02-06 ENCOUNTER — Encounter: Payer: Self-pay | Admitting: General Surgery

## 2013-02-06 ENCOUNTER — Ambulatory Visit (INDEPENDENT_AMBULATORY_CARE_PROVIDER_SITE_OTHER): Payer: Medicare Other | Admitting: General Surgery

## 2013-02-06 VITALS — BP 110/58 | HR 84 | Resp 16 | Ht 61.0 in | Wt 199.0 lb

## 2013-02-06 DIAGNOSIS — C50911 Malignant neoplasm of unspecified site of right female breast: Secondary | ICD-10-CM

## 2013-02-06 DIAGNOSIS — C50919 Malignant neoplasm of unspecified site of unspecified female breast: Secondary | ICD-10-CM

## 2013-02-06 NOTE — Patient Instructions (Addendum)
Patient to return in two weeks. Patient to use heat as needed and to take Advil . Patient may take tylenol, advil, or aleve as needed for pain or discomfort.

## 2013-02-06 NOTE — Progress Notes (Signed)
Patient ID: Kristin Frey, female   DOB: 29-Aug-1929, 79 y.o.   MRN: [id number]  Chief Complaint  Patient presents with  . Follow-up    mammosite placement    HPI Kristin Frey is a 79 y.o. female here today following up from her mammosite placement done on 01/18/13. She had it removed on 01/25/13 . Patient states she is doing well.  The patient has had some discomfort in the right breast since the MammoSite balloon was removed. She's had some aching, but minimal sharp discomfort. HPI  Past Medical History  Diagnosis Date  . Parkinson disease   . Carotid artery disease   . Cancer 1998    left breast mastectomy chemo and radiation  . Hernia   . Retinopathy   . DVT (deep venous thrombosis) 1998    Past Surgical History  Procedure Laterality Date  . Appendectomy  1949  . Carpal tunnel release Bilateral 1976  . Eye surgery  [phone]  . Cholecystectomy  2007  . Breast surgery Left 1998    mastectomy  . Breast surgery Right 2014    wide excision    No family history on file.  Social History History  Substance Use Topics  . Smoking status: Never Smoker   . Smokeless tobacco: Never Used  . Alcohol Use: No    Allergies  Allergen Reactions  . Penicillins Anaphylaxis  . Fish Allergy Hives    Current Outpatient Prescriptions  Medication Sig Dispense Refill  . cyclobenzaprine (FLEXERIL) 10 MG tablet Take 1 tablet by mouth daily.      . enalapril (VASOTEC) 10 MG tablet       . levothyroxine (SYNTHROID, LEVOTHROID) 50 MCG tablet Take 1 tablet by mouth daily.      Marland Kitchen Linaclotide (LINZESS) 145 MCG CAPS Take by mouth daily.      . meclizine (ANTIVERT) 12.5 MG tablet Take 1 tablet by mouth daily at 6 (six) AM.      . omeprazole (PRILOSEC) 40 MG capsule Take 1 capsule by mouth daily.      . predniSONE (DELTASONE) 50 MG tablet Take 1 tablet by mouth as directed.      Marland Kitchen rOPINIRole (REQUIP) 1 MG tablet Take 2 tablets (2 mg total) by mouth 3 (three) times daily.  180 tablet  3  . senna  (SENOKOT) 8.6 MG tablet Take 1 tablet by mouth as needed for constipation.      . simvastatin (ZOCOR) 40 MG tablet Take 1 tablet by mouth daily.      Marland Kitchen sulfamethoxazole-trimethoprim (BACTRIM DS) 800-160 MG per tablet Take 1 tablet by mouth 2 (two) times daily. Start the morning of the procedure 01-16-13  20 tablet  0  . traMADol (ULTRAM) 50 MG tablet Take 1 tablet by mouth as needed.      . venlafaxine XR (EFFEXOR-XR) 75 MG 24 hr capsule Take 1 capsule by mouth daily.      Marland Kitchen warfarin (COUMADIN) 2 MG tablet Take 2 mg by mouth daily.      Marland Kitchen warfarin (COUMADIN) 5 MG tablet Take 5 mg by mouth daily.      Marland Kitchen warfarin (COUMADIN) 6 MG tablet Take 1 tablet by mouth 3 (three) times a week.        No current facility-administered medications for this visit.    Review of Systems Review of Systems  Constitutional: Negative.   Respiratory: Negative.   Cardiovascular: Negative.     Blood pressure 110/58, pulse 84, resp. rate 16, height 5\' 1"  (  1.549 m), weight 199 lb (90.266 kg).  Physical Exam Physical Exam  Constitutional: She is oriented to person, place, and time. She appears well-developed and well-nourished.  Neurological: She is alert and oriented to person, place, and time.  Skin: Skin is warm and dry.  There is patent erythema over the breast and mild warmth. No true erythema or induration. No palpable seroma/hematoma. No lymphangitic streaking. The axillary incision is well-healed.  Data Reviewed No new data.  Assessment    Clinical examination is suggestive of ongoing effects of the axilla were partial breast radiation rather than infection.     Plan    Local heat, Tylenol and a good supportive bra has been recommended.  At her next visit we'll discuss the role of antiestrogen therapy.        Kristin Frey 02/07/2013, 7:31 PM

## 2013-02-07 ENCOUNTER — Encounter: Payer: Self-pay | Admitting: General Surgery

## 2013-02-11 ENCOUNTER — Ambulatory Visit: Payer: Self-pay | Admitting: Radiation Oncology

## 2013-02-20 ENCOUNTER — Ambulatory Visit (INDEPENDENT_AMBULATORY_CARE_PROVIDER_SITE_OTHER): Payer: Medicare Other | Admitting: General Surgery

## 2013-02-20 ENCOUNTER — Encounter: Payer: Self-pay | Admitting: General Surgery

## 2013-02-20 VITALS — BP 124/74 | HR 88 | Resp 20 | Ht 60.0 in | Wt 204.0 lb

## 2013-02-20 DIAGNOSIS — C50919 Malignant neoplasm of unspecified site of unspecified female breast: Secondary | ICD-10-CM

## 2013-02-20 DIAGNOSIS — C50911 Malignant neoplasm of unspecified site of right female breast: Secondary | ICD-10-CM

## 2013-02-20 MED ORDER — LETROZOLE 2.5 MG PO TABS: 2.5000 mg | ORAL_TABLET | Freq: Every day | ORAL | Status: DC

## 2013-02-20 NOTE — Progress Notes (Signed)
Patient ID: Kristin Frey, female   DOB: 30-Jan-1930, 79 y.o.   MRN: [id number]  Chief Complaint  Patient presents with  . Routine Post Op    2 week follow up breast cancer    HPI Kristin Frey is a 79 y.o. female female here today following up from her mammosite placement done on 01/18/13. She had it removed on 01/25/13 . Patient states she is doing well. She is accompanied by her daughter.   HPI  Past Medical History  Diagnosis Date  . Parkinson disease   . Carotid artery disease   . Cancer 1998    left breast mastectomy chemo and radiation  . Hernia   . Retinopathy   . DVT (deep venous thrombosis) 1998    Past Surgical History  Procedure Laterality Date  . Appendectomy  1949  . Carpal tunnel release Bilateral 1976  . Eye surgery  [phone]  . Cholecystectomy  2007  . Breast surgery Left 1998    mastectomy  . Breast surgery Right 2014    wide excision    History reviewed. No pertinent family history.  Social History History  Substance Use Topics  . Smoking status: Never Smoker   . Smokeless tobacco: Never Used  . Alcohol Use: No    Allergies  Allergen Reactions  . Penicillins Anaphylaxis  . Fish Allergy Hives    Current Outpatient Prescriptions  Medication Sig Dispense Refill  . cyclobenzaprine (FLEXERIL) 10 MG tablet Take 1 tablet by mouth daily.      . enalapril (VASOTEC) 10 MG tablet       . levothyroxine (SYNTHROID, LEVOTHROID) 50 MCG tablet Take 1 tablet by mouth daily.      Marland Kitchen Linaclotide (LINZESS) 145 MCG CAPS Take by mouth daily.      . meclizine (ANTIVERT) 12.5 MG tablet Take 1 tablet by mouth daily at 6 (six) AM.      . omeprazole (PRILOSEC) 40 MG capsule Take 1 capsule by mouth daily.      . predniSONE (DELTASONE) 50 MG tablet Take 1 tablet by mouth as directed.      Marland Kitchen rOPINIRole (REQUIP) 1 MG tablet Take 2 tablets (2 mg total) by mouth 3 (three) times daily.  180 tablet  3  . senna (SENOKOT) 8.6 MG tablet Take 1 tablet by mouth as needed for  constipation.      . simvastatin (ZOCOR) 40 MG tablet Take 1 tablet by mouth daily.      Marland Kitchen sulfamethoxazole-trimethoprim (BACTRIM DS) 800-160 MG per tablet Take 1 tablet by mouth 2 (two) times daily. Start the morning of the procedure 01-16-13  20 tablet  0  . traMADol (ULTRAM) 50 MG tablet Take 1 tablet by mouth as needed.      . venlafaxine XR (EFFEXOR-XR) 75 MG 24 hr capsule Take 1 capsule by mouth daily.      Marland Kitchen warfarin (COUMADIN) 2 MG tablet Take 2 mg by mouth daily.      Marland Kitchen warfarin (COUMADIN) 5 MG tablet Take 5 mg by mouth daily.      Marland Kitchen warfarin (COUMADIN) 6 MG tablet Take 1 tablet by mouth 3 (three) times a week.       . letrozole (FEMARA) 2.5 MG tablet Take 1 tablet (2.5 mg total) by mouth daily.  30 tablet  11   No current facility-administered medications for this visit.    Review of Systems Review of Systems  Constitutional: Negative.   Respiratory: Negative.   Cardiovascular: Negative.  Blood pressure 124/74, pulse 88, resp. rate 20, height 5' (1.524 m), weight 204 lb (92.534 kg).  Physical Exam Physical Exam  Constitutional: She is oriented to person, place, and time. She appears well-developed and well-nourished.  Pulmonary/Chest:  Well healed incision site of the right breast.   Neurological: She is alert and oriented to person, place, and time.  Skin: Skin is warm and dry.    Data Reviewed No new data.  Assessment    Doing well status post MammoSite treatment of her right breast cancer.    Plan    Indication for antiestrogen therapy was reviewed with the patient. While she's taking a number of medications, she was encouraged to at least make use of a one month trial of Femara. She will need to have the day of her last bone density confirmed.         [name] 02/21/2013, 8:16 AM

## 2013-02-20 NOTE — Patient Instructions (Addendum)
Patient to start Femara once a day. Patient to return in 2 months.

## 2013-02-21 ENCOUNTER — Encounter: Payer: Self-pay | Admitting: General Surgery

## 2013-03-13 ENCOUNTER — Encounter: Payer: Self-pay | Admitting: Diagnostic Neuroimaging

## 2013-03-13 ENCOUNTER — Ambulatory Visit (INDEPENDENT_AMBULATORY_CARE_PROVIDER_SITE_OTHER): Payer: Medicare Other | Admitting: Diagnostic Neuroimaging

## 2013-03-13 VITALS — BP 126/73 | HR 86 | Temp 97.7°F | Ht 61.0 in | Wt 198.0 lb

## 2013-03-13 DIAGNOSIS — M545 Low back pain: Secondary | ICD-10-CM | POA: Insufficient documentation

## 2013-03-13 DIAGNOSIS — R259 Unspecified abnormal involuntary movements: Secondary | ICD-10-CM

## 2013-03-13 DIAGNOSIS — R251 Tremor, unspecified: Secondary | ICD-10-CM | POA: Insufficient documentation

## 2013-03-13 DIAGNOSIS — M79643 Pain in unspecified hand: Secondary | ICD-10-CM | POA: Insufficient documentation

## 2013-03-13 DIAGNOSIS — R269 Unspecified abnormalities of gait and mobility: Secondary | ICD-10-CM | POA: Insufficient documentation

## 2013-03-13 DIAGNOSIS — G20 Parkinson's disease: Secondary | ICD-10-CM

## 2013-03-13 DIAGNOSIS — M79609 Pain in unspecified limb: Secondary | ICD-10-CM

## 2013-03-13 NOTE — Progress Notes (Signed)
[facility]  PATIENT: Kristin Frey DOB: February 22, 1930  REFERRING CLINICIAN:  HISTORY FROM: patient, daughter, friend REASON FOR VISIT: follow up visit   HISTORICAL  CHIEF COMPLAINT:  Chief Complaint  Patient presents with  . Follow-up    per EMR    HISTORY OF PRESENT ILLNESS:   UPDATE 03/13/13: Since last visit, has continue on ropinirole 2mg  TID. Tremor continues to progress. Unclear if ropinirole is helping her tremor or not. Patient now living with her daughter. She uses a cane to ambulate. No falls. Today, patient complains of trigger finger in left thumb, bilateral thumb soreness. Daughter and friend are more concerned about tremor. Also of note, July 2014 patient had recurrence of breast cancer status post lumpectomy and radiation seed placement. During that time her Parkinson symptoms transiently worsened.  UPDATE 10/11/11: Doing a little better with ropinirole (now up to 1mg  TID). Better tremor control. Daughter notices benefit more than patient. Continued insomnia.  UPDATE 08/11/11: Couldn't get DATscan for various reasons. Tremors and balance getting worse. Head tremor starting. No falls.  UPDATE 08/30/10: Took carb/levo (25/100, half tab daily) x 2 weeks, with no tremor relief.  Had to stop med because of HA and dizziness.  Tremor and handwriting continue to worsen.  UPDATE 06/16/10: Took 2 doses of carb-levo (1/2 tab) and then had severe HA; stopped med and HA went away.  Tremor still present, getting worse.  PRIOR HPI: 79 year old year-old atrial fibrillation, recurrent DVT, hypertension, hypothyroidism, hypercholesterolemia, depression, anxiety, breast cancer (status post mastectomy, chemotherapy and radiation therapy) presenting for evaluation of tremor past 10 years.  She is accompanied by her daughter.  Patient reports gradual, progressive tremor in her hands (left greater than right) over the past 10 years. This is worse when she is under stress or  upset. Tremor is noticed primarily when she is using her hand such as drinking from a cup, handwriting or using utensils. She reports significantly decreased handwriting ability over the past few years. She denies significant head or voice tremor. No significant tremor in her feet. She does have intermittent cramps in her hands and feet. No significant decline in smell or taste abilities. No reports of vivid dreams or acting out dreams. She also has mild balance and walking difficulties. She has a brother who recently is deceased from Parkinson's disease.  REVIEW OF SYSTEMS: Full 14 system review of systems performed and notable only for murmur swelling and leg swelling and arm shortness of breath snoring easy bruising.  ALLERGIES: Allergies  Allergen Reactions  . Penicillins Anaphylaxis  . Fish Allergy Hives    HOME MEDICATIONS:  Outpatient Prescriptions Prior to Visit  Medication Sig Dispense Refill  . cyclobenzaprine (FLEXERIL) 10 MG tablet Take 1 tablet by mouth daily.      . enalapril (VASOTEC) 10 MG tablet       . letrozole (FEMARA) 2.5 MG tablet Take 1 tablet (2.5 mg total) by mouth daily.  30 tablet  11  . levothyroxine (SYNTHROID, LEVOTHROID) 50 MCG tablet Take 1 tablet by mouth daily.      Marland Kitchen Linaclotide (LINZESS) 145 MCG CAPS Take by mouth daily.      . meclizine (ANTIVERT) 12.5 MG tablet Take 1 tablet by mouth daily at 6 (six) AM.      . omeprazole (PRILOSEC) 40 MG capsule Take 1 capsule by mouth daily.      . predniSONE (DELTASONE) 50 MG tablet Take 1 tablet by mouth as directed.      Marland Kitchen  rOPINIRole (REQUIP) 1 MG tablet Take 2 tablets (2 mg total) by mouth 3 (three) times daily.  180 tablet  3  . senna (SENOKOT) 8.6 MG tablet Take 1 tablet by mouth as needed for constipation.      . simvastatin (ZOCOR) 40 MG tablet Take 1 tablet by mouth daily.      Marland Kitchen sulfamethoxazole-trimethoprim (BACTRIM DS) 800-160 MG per tablet Take 1 tablet by mouth 2 (two) times daily. Start the morning of  the procedure 01-16-13  20 tablet  0  . traMADol (ULTRAM) 50 MG tablet Take 1 tablet by mouth as needed.      . venlafaxine XR (EFFEXOR-XR) 75 MG 24 hr capsule Take 1 capsule by mouth daily.      Marland Kitchen warfarin (COUMADIN) 5 MG tablet Take 5 mg by mouth daily.      Marland Kitchen warfarin (COUMADIN) 2 MG tablet Take 2 mg by mouth daily.      Marland Kitchen warfarin (COUMADIN) 6 MG tablet Take 1 tablet by mouth 3 (three) times a week.        No facility-administered medications prior to visit.    PAST MEDICAL HISTORY: Past Medical History  Diagnosis Date  . Parkinson disease   . Carotid artery disease   . Cancer 1998    left breast mastectomy chemo and radiation  . Hernia   . Retinopathy   . DVT (deep venous thrombosis) 1998  . High cholesterol   . Hypertension   . Hypothyroidism   . Atrial fibrillation     PAST SURGICAL HISTORY: Past Surgical History  Procedure Laterality Date  . Appendectomy  1949  . Carpal tunnel release Bilateral 1976  . Eye surgery  [phone]  . Cholecystectomy  2007  . Breast surgery Left 1998    mastectomy  . Breast surgery Right 2014    wide excision    FAMILY HISTORY: Family History  Problem Relation Age of Onset  . Heart attack Mother   . Heart disease Mother   . Heart disease Brother   . Parkinsonism Brother     SOCIAL HISTORY:  History   Social History  . Marital Status: Widowed    Spouse Name: N/A    Number of Children: 3  . Years of Education: 12   Occupational History  . N/A    Social History Main Topics  . Smoking status: Never Smoker   . Smokeless tobacco: Never Used  . Alcohol Use: No  . Drug Use: No  . Sexual Activity: Not on file   Other Topics Concern  . Not on file   Social History Narrative   Patient lives at home alone.   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 Brother developed Parkinson's use to "chew coal" as a child.)   Caffeine Use: 2-3 cups caffeine daily     PHYSICAL  EXAM  Filed Vitals:   03/13/13 1341  BP: 126/73  Pulse: 86  Temp: 97.7 F (36.5 C)  TempSrc: Oral  Height: 5\' 1"  (1.549 m)  Weight: 198 lb (89.812 kg)    Not recorded    Body mass index is 37.43 kg/(m^2).  GENERAL EXAM: General: Patient is awake, alert and in no acute distress.  Well developed and groomed.  MASKED FACIES; POSITIVE MYERSON'S SIGN. Neck: Neck is supple. Cardiovascular: Heart is regular rate and rhythm with no murmurs.  Neurologic Exam  Mental Status: Awake, alert.   Language is fluent and comprehension intact. Cranial  Nerves: Pupils are equal and reactive to light.  Visual fields are full to confrontation.  Conjugate eye movements are full and symmetric.  Facial sensation and strength are symmetric.  Hearing is intact.  Palate elevated symmetrically and uvula is midline.  Shoulder shug is symmetric.  Tongue is midline. Motor: Normal bulk and COGWHEEL RIGIDITY IN  LUE > RUE.  POSTURAL AND ACTION TREMOR (RUE > LUE). INTERMITTENT REST TREMOR (BUE) WITH CONTRALATERAL MOVEMENTS.  Full strength in the upper and lower extremities.  No pronator drift.  BRADYKINESIA IN UPPER AND LOWER EXT (LLE WORSE THAN RLE). Sensory: Intact and symmetric to light touch. Coordination: ACTION TREMOR ON FINGER NOSE FINGER (LUE>RUE). Gait and Station: Narrow based gait.  UNSTEADY, USING A SINGLE POINT CANE. RIGHT ARM TREMOR WITH WALKING. Reflexes: Deep tendon reflexes in the upper and lower extremity are present and symmetric; EXCEPT ABSENT AT ANKLES.   DIAGNOSTIC DATA (LABS, IMAGING, TESTING) - I reviewed patient records, labs, notes, testing and imaging myself where available.  Lab Results  Component Value Date   WBC 12.4* 12/18/2012   HGB 12.4 12/18/2012   HCT 38.5 12/18/2012   MCV 90 12/18/2012   PLT 277 08/13/2009      Component Value Date/Time   NA 141 12/18/2012 1651   NA 145 08/13/2009 1044   NA 137 01/07/2008 1126   K 4.5 12/18/2012 1651   K 4.1 08/13/2009 1044   CL 100 12/18/2012 1651   CL  104 08/13/2009 1044   CO2 28 12/18/2012 1651   CO2 31 08/13/2009 1044   GLUCOSE 97 12/18/2012 1651   GLUCOSE 121* 08/13/2009 1044   GLUCOSE 114* 01/07/2008 1126   BUN 21 12/18/2012 1651   BUN 19 08/13/2009 1044   BUN 23 01/07/2008 1126   CREATININE 1.01* 12/18/2012 1651   CREATININE 0.7 08/13/2009 1044   CALCIUM 9.1 12/18/2012 1651   CALCIUM 9.1 08/13/2009 1044   PROT 7.0 12/18/2012 1651   PROT 7.4 08/13/2009 1044   PROT 7.0 01/07/2008 1126   ALBUMIN 3.7 01/07/2008 1126   AST 33 12/18/2012 1651   AST 24 08/13/2009 1044   ALT 27 12/18/2012 1651   ALT 25 08/13/2009 1044   ALKPHOS 81 12/18/2012 1651   ALKPHOS 58 08/13/2009 1044   BILITOT 0.2 12/18/2012 1651   BILITOT 0.50 08/13/2009 1044   GFRNONAA 52* 12/18/2012 1651   GFRAA 60 12/18/2012 1651   No results found for this basename: CHOL, HDL, LDLCALC, LDLDIRECT, TRIG, CHOLHDL   No results found for this basename: HGBA1C   No results found for this basename: VITAMINB12   No results found for this basename: TSH    04/16/10 MRI brain - Mild chronic small vessel ischemic disease. Mild atrophy.   ASSESSMENT AND PLAN  79 y.o. female with atrial fibrillation, recurrent DVT, hypertension, hypothyroidism, hypercholesterolemia, depression, anxiety, breast cancer (status post mastectomy, chemotherapy and radiation therapy), Family history Parkinson's disease, and presenting with tremor, poor handwriting and balance difficulty since 2001. Neurologic exam notable for a mixed tremor (resting, action, postural), cogwheel rigidity, bradykinesia and mild balance difficulty.  Could not tolerate carb/levo trial (headaches).  Not much benefit on ropinirole 2mg  TID.  Dx: idiopathic parkinson's disease +/- essential tremor  PLAN: 1. Taper ropinirole off, to see if it was helping or not 2. Consider primidone or propranolol trial after ropinirole stopped 3. Try to get DATscan again 4. May consider azilect in future, but would request that patient be transitioned from effexor to celexa or  lexpro (which  are more compatible with azilect) 5. Use rollator walker 6. PT/OT referral 7. Information about [employer] Liftware spoon (anti-tremor) given  Orders Placed This Encounter  Procedures  . Ambulatory Referral for DME  . Ambulatory referral to Physical Therapy  . Ambulatory referral to Occupational Therapy  . AMB referral to nuclear medicine   Return in about 3 months (around 06/13/2013).    Suanne Marker, MD 03/13/2013, 2:43 PM Certified in Neurology, Neurophysiology and Neuroimaging  [facility] [address] [place], [place] [address] [phone]

## 2013-03-13 NOTE — Patient Instructions (Signed)
Taper ropinirole to 1mg  three times per day for 2 weeks; then reduce to 1 tab twice a day for 2 weeks, then 1 tab once daily x 1 week, then stop ropinirole. Observe tremor symptoms.  Use a walker.  I will refer you to physical therapy.

## 2013-03-22 ENCOUNTER — Telehealth: Payer: Self-pay | Admitting: *Deleted

## 2013-03-22 NOTE — Telephone Encounter (Signed)
Kristin Frey called and is asking about referral made for PT/OT for PD.  Would you want [facility]?  Dr. Marjory Lies out, can you address?  Since PD??

## 2013-03-22 NOTE — Telephone Encounter (Signed)
PT called, patient will go to PT for gait instability. At the time will be evaluated by OT for further needs

## 2013-03-28 ENCOUNTER — Emergency Department: Payer: Self-pay | Admitting: Emergency Medicine

## 2013-03-28 LAB — COMPREHENSIVE METABOLIC PANEL
Albumin: 3.2 g/dL — ABNORMAL LOW (ref 3.4–5.0)
Alkaline Phosphatase: 93 U/L (ref 50–136)
BUN: 18 mg/dL (ref 7–18)
Bilirubin,Total: 0.2 mg/dL (ref 0.2–1.0)
Chloride: 104 mmol/L (ref 98–107)
Co2: 31 mmol/L (ref 21–32)
SGOT(AST): 37 U/L (ref 15–37)

## 2013-03-28 LAB — CBC
HCT: 38.6 % (ref 35.0–47.0)
HGB: 13 g/dL (ref 12.0–16.0)
MCHC: 33.8 g/dL (ref 32.0–36.0)
Platelet: 216 10*3/uL (ref 150–440)
RBC: 4.25 10*6/uL (ref 3.80–5.20)
RDW: 14.9 % — ABNORMAL HIGH (ref 11.5–14.5)
WBC: 9.3 10*3/uL (ref 3.6–11.0)

## 2013-03-28 LAB — PROTIME-INR
INR: 1.9
Prothrombin Time: 21.1 secs — ABNORMAL HIGH (ref 11.5–14.7)

## 2013-04-08 ENCOUNTER — Encounter: Payer: Self-pay | Admitting: Neurology

## 2013-04-11 ENCOUNTER — Telehealth: Payer: Self-pay | Admitting: Diagnostic Neuroimaging

## 2013-04-11 NOTE — Telephone Encounter (Signed)
Appointment is sched tues. 03/16/13 and need to know if she has to stop any of her meds before precedure

## 2013-04-11 NOTE — Telephone Encounter (Signed)
Spoke with Kristin Frey at [facility] concerning medications to be stopped for DAT procedure on 04/16/13, she said that patient does not need to stop taking any of her prescribed medicines, informed patient's sister, she understood.

## 2013-04-13 ENCOUNTER — Encounter: Payer: Self-pay | Admitting: Neurology

## 2013-04-23 ENCOUNTER — Encounter: Payer: Self-pay | Admitting: General Surgery

## 2013-04-23 ENCOUNTER — Ambulatory Visit (INDEPENDENT_AMBULATORY_CARE_PROVIDER_SITE_OTHER): Payer: Medicare Other | Admitting: General Surgery

## 2013-04-23 ENCOUNTER — Encounter: Payer: Self-pay | Admitting: *Deleted

## 2013-04-23 VITALS — BP 150/84 | HR 82 | Resp 16 | Ht 61.0 in | Wt 201.0 lb

## 2013-04-23 DIAGNOSIS — C50919 Malignant neoplasm of unspecified site of unspecified female breast: Secondary | ICD-10-CM

## 2013-04-23 DIAGNOSIS — C50911 Malignant neoplasm of unspecified site of right female breast: Secondary | ICD-10-CM

## 2013-04-23 NOTE — Progress Notes (Signed)
Patient ID: Kristin Frey, female   DOB: [birth year], 79 y.o.   MRN: [id number]  Chief Complaint  Patient presents with  . Follow-up    2 month follow up breast cancer    HPI Kristin Frey is a 79 y.o. female who presents for a 2 month follow up of right breast cancer. The patient underwent a right breast wide excision, sentinel node biopsy, and mastoplasty on 01/02/13. The patient states she is doing well. No new complaints at this time.   HPI  Past Medical History  Diagnosis Date  . Parkinson disease   . Carotid artery disease   . Hernia   . Retinopathy   . DVT (deep venous thrombosis) 1998  . High cholesterol   . Hypertension   . Hypothyroidism   . Atrial fibrillation   . Cancer 1998    left breast mastectomy chemo and radiation  . Cancer 2014    right breast wide excision     Past Surgical History  Procedure Laterality Date  . Appendectomy  1949  . Carpal tunnel release Bilateral 1976  . Eye surgery  [phone]  . Cholecystectomy  2007  . Breast surgery Left 1998    mastectomy  . Breast surgery Right 2014    wide excision, sentinel node bx, mastoplasty    Family History  Problem Relation Age of Onset  . Heart attack Mother   . Heart disease Mother   . Heart disease Brother   . Parkinsonism Brother     Social History History  Substance Use Topics  . Smoking status: Never Smoker   . Smokeless tobacco: Never Used  . Alcohol Use: No    Allergies  Allergen Reactions  . Penicillins Anaphylaxis  . Fish Allergy Hives    Current Outpatient Prescriptions  Medication Sig Dispense Refill  . cyclobenzaprine (FLEXERIL) 10 MG tablet Take 1 tablet by mouth daily.      . enalapril (VASOTEC) 10 MG tablet       . letrozole (FEMARA) 2.5 MG tablet Take 1 tablet (2.5 mg total) by mouth daily.  30 tablet  11  . levothyroxine (SYNTHROID, LEVOTHROID) 50 MCG tablet Take 1 tablet by mouth daily.      Marland Kitchen Linaclotide (LINZESS) 145 MCG CAPS Take by mouth daily.      . meclizine  (ANTIVERT) 12.5 MG tablet Take 1 tablet by mouth daily at 6 (six) AM.      . omeprazole (PRILOSEC) 40 MG capsule Take 1 capsule by mouth daily.      Marland Kitchen rOPINIRole (REQUIP) 1 MG tablet Take 2 tablets (2 mg total) by mouth 3 (three) times daily.  180 tablet  3  . senna (SENOKOT) 8.6 MG tablet Take 1 tablet by mouth as needed for constipation.      . simvastatin (ZOCOR) 40 MG tablet Take 1 tablet by mouth daily.      . traMADol (ULTRAM) 50 MG tablet Take 1 tablet by mouth as needed.      . venlafaxine XR (EFFEXOR-XR) 75 MG 24 hr capsule Take 1 capsule by mouth daily.      Marland Kitchen warfarin (COUMADIN) 5 MG tablet Take 5 mg by mouth daily.       No current facility-administered medications for this visit.    Review of Systems Review of Systems  Constitutional: Negative.   Respiratory: Negative.   Cardiovascular: Negative.     Blood pressure 150/84, pulse 82, resp. rate 16, height 5\' 1"  (1.549 m), weight 201 lb (91.173 kg).  Physical Exam Physical Exam  Constitutional: She is oriented to person, place, and time. She appears well-developed and well-nourished.  Pulmonary/Chest:  Well healed mastectomy site on the left. Telangiectasias evident.  The right breast wide excision site is soft and nontender. No evidence of edema or induration.   Neurological: She is alert and oriented to person, place, and time.  Skin: Skin is warm and dry.    Data Reviewed No new data.  Assessment    Doing well status post partial mastectomy. Tolerating antiestrogen therapy well.     Plan    We'll plan for a followup examination in January 2015 with a 6 month posttreatment mammogram at that time.     Patient to have a unilateral right breast diagnostic mammogram at the [facility]. This has been scheduled for 06-20-13 at 2:40 pm. She will follow up in the office after mammogram is completed.    Kristin Frey 04/23/2013, 9:25 PM

## 2013-04-23 NOTE — Patient Instructions (Signed)
Patient to return in January 2015 with right breast diagnostic mammogram.

## 2013-05-13 ENCOUNTER — Encounter: Payer: Self-pay | Admitting: Neurology

## 2013-05-22 ENCOUNTER — Encounter: Payer: Self-pay | Admitting: Diagnostic Neuroimaging

## 2013-06-17 ENCOUNTER — Ambulatory Visit (INDEPENDENT_AMBULATORY_CARE_PROVIDER_SITE_OTHER): Payer: Medicare Other | Admitting: Diagnostic Neuroimaging

## 2013-06-17 ENCOUNTER — Encounter: Payer: Self-pay | Admitting: Diagnostic Neuroimaging

## 2013-06-17 VITALS — BP 131/68 | HR 95 | Ht 60.0 in | Wt 200.0 lb

## 2013-06-17 DIAGNOSIS — R251 Tremor, unspecified: Secondary | ICD-10-CM

## 2013-06-17 DIAGNOSIS — R259 Unspecified abnormal involuntary movements: Secondary | ICD-10-CM

## 2013-06-17 DIAGNOSIS — R269 Unspecified abnormalities of gait and mobility: Secondary | ICD-10-CM

## 2013-06-17 MED ORDER — PRIMIDONE 50 MG PO TABS
50.0000 mg | ORAL_TABLET | Freq: Two times a day (BID) | ORAL | Status: DC
Start: 2013-06-17 — End: 2013-09-23

## 2013-06-17 NOTE — Progress Notes (Signed)
[facility]  PATIENT: Kristin Frey DOB: 1929-12-16  REFERRING CLINICIAN:  HISTORY FROM: patient, daughter REASON FOR VISIT: follow up visit   HISTORICAL  CHIEF COMPLAINT:  Chief Complaint  Patient presents with  . Follow-up     Parkinsons dissease.Marland KitchenMarland Kitchen#6    HISTORY OF PRESENT ILLNESS:   UPDATE 06/17/13: Since last visit, tried going off ropinirole, but within 12 hours had severe tremors. Went back on ropinirole, and tremors improved. Memory getting worse. Some intermittent visual hallucincations.   UPDATE 03/13/13: Since last visit, has continue on ropinirole 2mg  TID. Tremor continues to progress. Unclear if ropinirole is helping her tremor or not. Patient now living with her daughter. She uses a cane to ambulate. No falls. Today, patient complains of trigger finger in left thumb, bilateral thumb soreness. Daughter and friend are more concerned about tremor. Also of note, July 2014 patient had recurrence of breast cancer status post lumpectomy and radiation seed placement. During that time her Parkinson symptoms transiently worsened.  UPDATE 10/11/11: Doing a little better with ropinirole (now up to 1mg  TID). Better tremor control. Daughter notices benefit more than patient. Continued insomnia.  UPDATE 08/11/11: Couldn't get DATscan for various reasons. Tremors and balance getting worse. Head tremor starting. No falls.  UPDATE 08/30/10: Took carb/levo (25/100, half tab daily) x 2 weeks, with no tremor relief.  Had to stop med because of HA and dizziness.  Tremor and handwriting continue to worsen.  UPDATE 06/16/10: Took 2 doses of carb-levo (1/2 tab) and then had severe HA; stopped med and HA went away.  Tremor still present, getting worse.  PRIOR HPI: 80 year old year-old atrial fibrillation, recurrent DVT, hypertension, hypothyroidism, hypercholesterolemia, depression, anxiety, breast cancer (status post mastectomy, chemotherapy and radiation therapy) presenting for  evaluation of tremor past 10 years.  She is accompanied by her daughter.  Patient reports gradual, progressive tremor in her hands (left greater than right) over the past 10 years. This is worse when she is under stress or upset. Tremor is noticed primarily when she is using her hand such as drinking from a cup, handwriting or using utensils. She reports significantly decreased handwriting ability over the past few years. She denies significant head or voice tremor. No significant tremor in her feet. She does have intermittent cramps in her hands and feet. No significant decline in smell or taste abilities. No reports of vivid dreams or acting out dreams. She also has mild balance and walking difficulties. She has a brother who recently is deceased from Parkinson's disease.  REVIEW OF SYSTEMS: Full 14 system review of systems performed and notable only for memory loss, joint swelling, back pain, loss of vision, leg swelling, SOB, bladder incontinence   ALLERGIES: Allergies  Allergen Reactions  . Penicillins Anaphylaxis  . Fish Allergy Hives    HOME MEDICATIONS:  Outpatient Prescriptions Prior to Visit  Medication Sig Dispense Refill  . cyclobenzaprine (FLEXERIL) 10 MG tablet Take 1 tablet by mouth daily.      . enalapril (VASOTEC) 10 MG tablet       . letrozole (FEMARA) 2.5 MG tablet Take 1 tablet (2.5 mg total) by mouth daily.  30 tablet  11  . levothyroxine (SYNTHROID, LEVOTHROID) 50 MCG tablet Take 1 tablet by mouth daily.      Marland Kitchen Linaclotide (LINZESS) 145 MCG CAPS Take by mouth daily.      . meclizine (ANTIVERT) 12.5 MG tablet Take 1 tablet by mouth daily at 6 (six) AM.      .  omeprazole (PRILOSEC) 40 MG capsule Take 1 capsule by mouth daily.      Marland Kitchen rOPINIRole (REQUIP) 1 MG tablet Take 2 tablets (2 mg total) by mouth 3 (three) times daily.  180 tablet  3  . senna (SENOKOT) 8.6 MG tablet Take 1 tablet by mouth as needed for constipation.      . simvastatin (ZOCOR) 40 MG tablet Take 1  tablet by mouth daily.      . traMADol (ULTRAM) 50 MG tablet Take 1 tablet by mouth as needed.      . venlafaxine XR (EFFEXOR-XR) 75 MG 24 hr capsule Take 1 capsule by mouth daily.      Marland Kitchen warfarin (COUMADIN) 5 MG tablet Take 5 mg by mouth daily.       No facility-administered medications prior to visit.    PAST MEDICAL HISTORY: Past Medical History  Diagnosis Date  . Parkinson disease   . Carotid artery disease   . Hernia   . Retinopathy   . DVT (deep venous thrombosis) 1998  . High cholesterol   . Hypertension   . Hypothyroidism   . Atrial fibrillation   . Cancer 1998    left breast mastectomy chemo and radiation  . Cancer 2014    right breast wide excision     PAST SURGICAL HISTORY: Past Surgical History  Procedure Laterality Date  . Appendectomy  1949  . Carpal tunnel release Bilateral 1976  . Eye surgery  [id number]  . Cholecystectomy  2007  . Breast surgery Left 1998    mastectomy  . Breast surgery Right 2014    wide excision, sentinel node bx, mastoplasty    FAMILY HISTORY: Family History  Problem Relation Age of Onset  . Heart attack Mother   . Heart disease Mother   . Heart disease Brother   . Parkinsonism Brother     SOCIAL HISTORY:  History   Social History  . Marital Status: Widowed    Spouse Name: N/A    Number of Children: 3  . Years of Education: 12   Occupational History  . N/A    Social History Main Topics  . Smoking status: Never Smoker   . Smokeless tobacco: Never Used  . Alcohol Use: No  . Drug Use: No  . Sexual Activity: No   Other Topics Concern  . Not on file   Social History Narrative   Patient lives at home alone.   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 Brother developed Parkinson's use to "chew coal" as a child.)   Caffeine Use: 2-3 cups caffeine daily     PHYSICAL EXAM  Filed Vitals:   06/17/13 1048  BP: 131/68  Pulse: 95  Height: 5' (1.524 m)  Weight: 200 lb  (90.719 kg)    Not recorded    Body mass index is 39.06 kg/(m^2).  GENERAL EXAM: General: Patient is awake, alert and in no acute distress.  Well developed and groomed.  MASKED FACIES; POSITIVE MYERSON'S SIGN. POSITIVE SNOUT. Neck: Neck is supple. Cardiovascular: Heart is regular rate and rhythm with no murmurs.  Neurologic Exam  Mental Status: Awake, alert.   Language is fluent and comprehension intact. Cranial Nerves: Pupils are equal and reactive to light.  Visual fields are full to confrontation.  Conjugate eye movements are full and symmetric.  Facial sensation and strength are symmetric.  Hearing is intact.  Palate elevated symmetrically and uvula is midline.  Shoulder shug is  symmetric.  Tongue is midline. Motor: Normal bulk and COGWHEEL RIGIDITY IN  LUE > RUE.  POSTURAL AND ACTION TREMOR (RUE > LUE). INTERMITTENT REST TREMOR (RUE) WITH CONTRALATERAL MOVEMENTS.  Full strength in the upper and lower extremities.  No pronator drift.  BRADYKINESIA IN UPPER AND LOWER EXT (LLE WORSE THAN RLE). Sensory: Intact and symmetric to light touch. Coordination: ACTION TREMOR ON FINGER NOSE FINGER (LUE>RUE). Gait and Station: Narrow based gait.  UNSTEADY, USING A SINGLE POINT CANE. RIGHT ARM TREMOR WITH WALKING. Reflexes: Deep tendon reflexes in the upper and lower extremity are present and symmetric; EXCEPT ABSENT AT ANKLES.   DIAGNOSTIC DATA (LABS, IMAGING, TESTING) - I reviewed patient records, labs, notes, testing and imaging myself where available.  Lab Results  Component Value Date   WBC 12.4* 12/18/2012   HGB 12.4 12/18/2012   HCT 38.5 12/18/2012   MCV 90 12/18/2012   PLT 277 08/13/2009      Component Value Date/Time   NA 141 12/18/2012 1651   NA 145 08/13/2009 1044   NA 137 01/07/2008 1126   K 4.5 12/18/2012 1651   K 4.1 08/13/2009 1044   CL 100 12/18/2012 1651   CL 104 08/13/2009 1044   CO2 28 12/18/2012 1651   CO2 31 08/13/2009 1044   GLUCOSE 97 12/18/2012 1651   GLUCOSE 121* 08/13/2009 1044    GLUCOSE 114* 01/07/2008 1126   BUN 21 12/18/2012 1651   BUN 19 08/13/2009 1044   BUN 23 01/07/2008 1126   CREATININE 1.01* 12/18/2012 1651   CREATININE 0.7 08/13/2009 1044   CALCIUM 9.1 12/18/2012 1651   CALCIUM 9.1 08/13/2009 1044   PROT 7.0 12/18/2012 1651   PROT 7.4 08/13/2009 1044   PROT 7.0 01/07/2008 1126   ALBUMIN 3.7 01/07/2008 1126   AST 33 12/18/2012 1651   AST 24 08/13/2009 1044   ALT 27 12/18/2012 1651   ALT 25 08/13/2009 1044   ALKPHOS 81 12/18/2012 1651   ALKPHOS 58 08/13/2009 1044   BILITOT 0.2 12/18/2012 1651   BILITOT 0.50 08/13/2009 1044   GFRNONAA 52* 12/18/2012 1651   GFRAA 60 12/18/2012 1651   No results found for this basename: CHOL,  HDL,  LDLCALC,  LDLDIRECT,  TRIG,  CHOLHDL   No results found for this basename: HGBA1C   No results found for this basename: VITAMINB12   No results found for this basename: TSH    04/16/10 MRI brain - Mild chronic small vessel ischemic disease. Mild atrophy.  04/17/13 DATscan - normal   ASSESSMENT AND PLAN  80 y.o. female with atrial fibrillation, recurrent DVT, hypertension, hypothyroidism, hypercholesterolemia, depression, anxiety, breast cancer (status post mastectomy, chemotherapy and radiation therapy), Family history Parkinson's disease, and presenting with tremor, poor handwriting and balance difficulty since 2001. Neurologic exam notable for a mixed tremor (resting, action, postural), cogwheel rigidity, bradykinesia and mild balance difficulty.  Could not tolerate carb/levo trial (headaches).  Some benefit on ropinirole 2mg  TID. DATscan normal.  Dx: dementia with lewy bodies +/- essential tremor  PLAN: 1. Continue ropinirole (seems like it is helping) 2. Trial of primidone 3. Use cane and rollator walker   No orders of the defined types were placed in this encounter.   Return in about 3 months (around 09/15/2013).    Penni Bombard, MD 0/11/2692, 85:46 PM Certified in Neurology, Neurophysiology and Neuroimaging  [facility] [address], [place], [place] [address] [phone] s

## 2013-06-17 NOTE — Patient Instructions (Addendum)
Start primidone 25mg  at bedtime. Increase to 25mg  twice a day after 2 weeks. Then increase to 50mg  twice a day after 2 weeks.  Continue ropinirole.  Have coumadin level checked more closely due to interaction of primidone an coumadin.

## 2013-06-20 ENCOUNTER — Ambulatory Visit: Payer: Self-pay | Admitting: General Surgery

## 2013-06-21 ENCOUNTER — Encounter: Payer: Self-pay | Admitting: General Surgery

## 2013-06-27 ENCOUNTER — Ambulatory Visit: Payer: Medicare Other | Admitting: General Surgery

## 2013-07-08 ENCOUNTER — Encounter: Payer: Self-pay | Admitting: General Surgery

## 2013-07-08 ENCOUNTER — Ambulatory Visit (INDEPENDENT_AMBULATORY_CARE_PROVIDER_SITE_OTHER): Payer: Medicare Other | Admitting: General Surgery

## 2013-07-08 VITALS — BP 148/70 | HR 76 | Resp 14 | Ht 60.0 in | Wt 197.0 lb

## 2013-07-08 DIAGNOSIS — C50919 Malignant neoplasm of unspecified site of unspecified female breast: Secondary | ICD-10-CM

## 2013-07-08 DIAGNOSIS — C50911 Malignant neoplasm of unspecified site of right female breast: Secondary | ICD-10-CM

## 2013-07-08 NOTE — Progress Notes (Addendum)
Patient ID: Kristin Frey, female   DOB: 03/16/1930, 80 y.o.   MRN: [id number]  Chief Complaint  Patient presents with  . Follow-up    mammogram    HPI Kristin Frey is a 80 y.o. female. who presents for a breast evaluation. The most recent right breast  mammogram was done on 06/20/13.  Patient does perform regular self breast checks and gets regular mammograms done.   MammoSite treatment completed August 2014.  HPI  Past Medical History  Diagnosis Date  . Parkinson disease   . Carotid artery disease   . Hernia   . Retinopathy   . DVT (deep venous thrombosis) 1998  . High cholesterol   . Hypertension   . Hypothyroidism   . Atrial fibrillation   . Cancer 1998    left breast mastectomy chemo and radiation  . Cancer 2014    right breast wide excision     Past Surgical History  Procedure Laterality Date  . Appendectomy  1949  . Carpal tunnel release Bilateral 1976  . Eye surgery  [id number]  . Cholecystectomy  2007  . Breast surgery Left 1998    mastectomy  . Breast surgery Right 2014    wide excision, sentinel node bx, mastoplasty    Family History  Problem Relation Age of Onset  . Heart attack Mother   . Heart disease Mother   . Heart disease Brother   . Parkinsonism Brother     Social History History  Substance Use Topics  . Smoking status: Never Smoker   . Smokeless tobacco: Never Used  . Alcohol Use: No    Allergies  Allergen Reactions  . Penicillins Anaphylaxis  . Fish Allergy Hives    Current Outpatient Prescriptions  Medication Sig Dispense Refill  . cyclobenzaprine (FLEXERIL) 10 MG tablet Take 1 tablet by mouth daily.      . enalapril (VASOTEC) 10 MG tablet       . letrozole (FEMARA) 2.5 MG tablet Take 1 tablet (2.5 mg total) by mouth daily.  30 tablet  11  . levothyroxine (SYNTHROID, LEVOTHROID) 50 MCG tablet Take 1 tablet by mouth daily.      Marland Kitchen Linaclotide (LINZESS) 145 MCG CAPS Take by mouth daily.      . meclizine (ANTIVERT) 12.5 MG tablet Take  1 tablet by mouth daily at 6 (six) AM.      . omeprazole (PRILOSEC) 40 MG capsule Take 1 capsule by mouth daily.      . primidone (MYSOLINE) 50 MG tablet Take 1 tablet (50 mg total) by mouth 2 (two) times daily.  60 tablet  6  . rOPINIRole (REQUIP) 1 MG tablet Take 2 tablets (2 mg total) by mouth 3 (three) times daily.  180 tablet  3  . senna (SENOKOT) 8.6 MG tablet Take 1 tablet by mouth as needed for constipation.      . simvastatin (ZOCOR) 40 MG tablet Take 1 tablet by mouth daily.      . traMADol (ULTRAM) 50 MG tablet Take 1 tablet by mouth as needed.      . venlafaxine XR (EFFEXOR-XR) 75 MG 24 hr capsule Take 1 capsule by mouth daily.      Marland Kitchen warfarin (COUMADIN) 5 MG tablet Take 5 mg by mouth daily.       No current facility-administered medications for this visit.    Review of Systems Review of Systems  Constitutional: Negative.   Respiratory: Negative.   Cardiovascular: Negative.     Blood pressure 148/70,  pulse 76, resp. rate 14, height 5' (1.524 m), weight 197 lb (89.359 kg).  Physical Exam Physical Exam  Constitutional: She is oriented to person, place, and time. She appears well-developed and well-nourished.  Eyes: Conjunctivae are normal.  Neck: Neck supple.  Cardiovascular: Normal rate, regular rhythm and normal heart sounds.   Pulmonary/Chest: Breath sounds normal. Right breast exhibits no inverted nipple, no mass, no nipple discharge, no skin change and no tenderness. Left breast exhibits no inverted nipple, no mass, no nipple discharge, no skin change and no tenderness.  Lymphadenopathy:    She has no cervical adenopathy.    She has no axillary adenopathy.  Neurological: She is alert and oriented to person, place, and time.  Skin: Skin is warm and dry.    Data Reviewed Right breast mammogram dated June 20, 2013 was reviewed. Post lumpectomy changes identified. BI-RAD-2. One-year followup recommended.  Assessment    Doing well status post management of right  breast cancer with partial mastectomy.     Plan    The patient will be asked to return in 6 month with a repeat mammogram to establish an ongoing baseline for the anticipated radiation changes.   The patient is tolerating her Letrazol without ill effect.       Robert Bellow 07/09/2013, 8:18 PM

## 2013-07-08 NOTE — Patient Instructions (Signed)
Patient to return in six months right breast diagnotic mammogram. 

## 2013-07-09 ENCOUNTER — Encounter: Payer: Self-pay | Admitting: General Surgery

## 2013-07-26 ENCOUNTER — Ambulatory Visit: Payer: Self-pay | Admitting: Radiation Oncology

## 2013-08-11 ENCOUNTER — Ambulatory Visit: Payer: Self-pay | Admitting: Radiation Oncology

## 2013-09-16 ENCOUNTER — Ambulatory Visit: Payer: Medicare Other | Admitting: Diagnostic Neuroimaging

## 2013-09-23 ENCOUNTER — Encounter: Payer: Self-pay | Admitting: Diagnostic Neuroimaging

## 2013-09-23 ENCOUNTER — Encounter (INDEPENDENT_AMBULATORY_CARE_PROVIDER_SITE_OTHER): Payer: Self-pay

## 2013-09-23 ENCOUNTER — Ambulatory Visit (INDEPENDENT_AMBULATORY_CARE_PROVIDER_SITE_OTHER): Payer: Medicare Other | Admitting: Diagnostic Neuroimaging

## 2013-09-23 VITALS — BP 137/77 | HR 87 | Ht 60.0 in | Wt 198.5 lb

## 2013-09-23 DIAGNOSIS — R251 Tremor, unspecified: Secondary | ICD-10-CM

## 2013-09-23 DIAGNOSIS — R269 Unspecified abnormalities of gait and mobility: Secondary | ICD-10-CM

## 2013-09-23 DIAGNOSIS — G20 Parkinson's disease: Secondary | ICD-10-CM

## 2013-09-23 DIAGNOSIS — R259 Unspecified abnormal involuntary movements: Secondary | ICD-10-CM

## 2013-09-23 DIAGNOSIS — M545 Low back pain, unspecified: Secondary | ICD-10-CM

## 2013-09-23 MED ORDER — PRIMIDONE 50 MG PO TABS: 50.0000 mg | ORAL_TABLET | Freq: Two times a day (BID) | ORAL | Status: DC

## 2013-09-23 MED ORDER — ROPINIROLE HCL 1 MG PO TABS: 2.0000 mg | ORAL_TABLET | Freq: Three times a day (TID) | ORAL | Status: DC

## 2013-09-23 NOTE — Progress Notes (Signed)
[facility]  PATIENT: Kristin Frey DOB: 06/16/1929  REFERRING CLINICIAN:  HISTORY FROM: patient, daughter REASON FOR VISIT: follow up visit   HISTORICAL  CHIEF COMPLAINT:  Chief Complaint  Patient presents with  . Follow-up    gait    HISTORY OF PRESENT ILLNESS:   UPDATE 09/23/13: Doing well. Tremor stable to slightly improved on ropinirole and primidone. Occ visual hallucinations (saw a cat on her bed). Otherwise doing well.  UPDATE 06/17/13: Since last visit, tried going off ropinirole, but within 12 hours had severe tremors. Went back on ropinirole, and tremors improved. Memory getting worse. Some intermittent visual hallucincations.   UPDATE 03/13/13: Since last visit, has continue on ropinirole 2mg  TID. Tremor continues to progress. Unclear if ropinirole is helping her tremor or not. Patient now living with her daughter. She uses a cane to ambulate. No falls. Today, patient complains of trigger finger in left thumb, bilateral thumb soreness. Daughter and friend are more concerned about tremor. Also of note, July 2014 patient had recurrence of breast cancer status post lumpectomy and radiation seed placement. During that time her Parkinson symptoms transiently worsened.  UPDATE 10/11/11: Doing a little better with ropinirole (now up to 1mg  TID). Better tremor control. Daughter notices benefit more than patient. Continued insomnia.  UPDATE 08/11/11: Couldn't get DATscan for various reasons. Tremors and balance getting worse. Head tremor starting. No falls.  UPDATE 08/30/10: Took carb/levo (25/100, half tab daily) x 2 weeks, with no tremor relief.  Had to stop med because of HA and dizziness.  Tremor and handwriting continue to worsen.  UPDATE 06/16/10: Took 2 doses of carb-levo (1/2 tab) and then had severe HA; stopped med and HA went away.  Tremor still present, getting worse.  PRIOR HPI: 80 year old year-old atrial fibrillation, recurrent DVT, hypertension,  hypothyroidism, hypercholesterolemia, depression, anxiety, breast cancer (status post mastectomy, chemotherapy and radiation therapy) presenting for evaluation of tremor past 10 years.  She is accompanied by her daughter.  Patient reports gradual, progressive tremor in her hands (left greater than right) over the past 10 years. This is worse when she is under stress or upset. Tremor is noticed primarily when she is using her hand such as drinking from a cup, handwriting or using utensils. She reports significantly decreased handwriting ability over the past few years. She denies significant head or voice tremor. No significant tremor in her feet. She does have intermittent cramps in her hands and feet. No significant decline in smell or taste abilities. No reports of vivid dreams or acting out dreams. She also has mild balance and walking difficulties. She has a brother who recently is deceased from Parkinson's disease.  REVIEW OF SYSTEMS: Full 14 system review of systems performed and notable only for memory loss dizziness tremor agitation confusion depression anxiety bladder incontinence frequent urination joint pain joint swelling back pain aching muscle cramps difficulty walking insomnia snoring-like swelling macular degeneration fatigue decreased activity.    ALLERGIES: Allergies  Allergen Reactions  . Penicillins Anaphylaxis  . Fish Allergy Hives    HOME MEDICATIONS:  Outpatient Prescriptions Prior to Visit  Medication Sig Dispense Refill  . cyclobenzaprine (FLEXERIL) 10 MG tablet Take 1 tablet by mouth daily.      . enalapril (VASOTEC) 10 MG tablet       . letrozole (FEMARA) 2.5 MG tablet Take 1 tablet (2.5 mg total) by mouth daily.  30 tablet  11  . levothyroxine (SYNTHROID, LEVOTHROID) 50 MCG tablet Take 1 tablet by mouth daily.      Marland Kitchen  meclizine (ANTIVERT) 12.5 MG tablet Take 1 tablet by mouth daily at 6 (six) AM.      . omeprazole (PRILOSEC) 40 MG capsule Take 1 capsule by mouth  daily.      Marland Kitchen senna (SENOKOT) 8.6 MG tablet Take 1 tablet by mouth as needed for constipation.      . simvastatin (ZOCOR) 40 MG tablet Take 1 tablet by mouth daily.      . traMADol (ULTRAM) 50 MG tablet Take 1 tablet by mouth as needed.      . venlafaxine XR (EFFEXOR-XR) 75 MG 24 hr capsule Take 1 capsule by mouth daily.      Marland Kitchen rOPINIRole (REQUIP) 1 MG tablet Take 2 tablets (2 mg total) by mouth 3 (three) times daily.  180 tablet  3  . Linaclotide (LINZESS) 145 MCG CAPS Take by mouth daily.      . primidone (MYSOLINE) 50 MG tablet Take 1 tablet (50 mg total) by mouth 2 (two) times daily.  60 tablet  6  . warfarin (COUMADIN) 5 MG tablet Take 7 mg by mouth daily.        No facility-administered medications prior to visit.    PAST MEDICAL HISTORY: Past Medical History  Diagnosis Date  . Parkinson disease   . Carotid artery disease   . Hernia   . Retinopathy   . DVT (deep venous thrombosis) 1998  . High cholesterol   . Hypertension   . Hypothyroidism   . Atrial fibrillation   . Cancer 1998    left breast mastectomy chemo and radiation  . Cancer 2014    right breast wide excision   . Diabetes     PAST SURGICAL HISTORY: Past Surgical History  Procedure Laterality Date  . Appendectomy  1949  . Carpal tunnel release Bilateral 1976  . Eye surgery  [phone]  . Cholecystectomy  2007  . Breast surgery Left 1998    mastectomy  . Breast surgery Right 2014    wide excision, sentinel node bx, mastoplasty,MammoSite    FAMILY HISTORY: Family History  Problem Relation Age of Onset  . Heart attack Mother   . Heart disease Mother   . Heart disease Brother   . Parkinsonism Brother     SOCIAL HISTORY:  History   Social History  . Marital Status: Widowed    Spouse Name: N/A    Number of Children: 3  . Years of Education: 12   Occupational History  . N/A    Social History Main Topics  . Smoking status: Never Smoker   . Smokeless tobacco: Never Used  . Alcohol Use: No  .  Drug Use: No  . Sexual Activity: No   Other Topics Concern  . Not on file   Social History Narrative   Patient lives at home alone.   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 Brother developed Parkinson's use to "chew coal" as a child.)   Caffeine Use: 2-3 cups caffeine daily     PHYSICAL EXAM  Filed Vitals:   09/23/13 1117  BP: 137/77  Pulse: 87  Height: 5' (1.524 m)  Weight: 198 lb 8 oz (90.039 kg)    Not recorded    Body mass index is 38.77 kg/(m^2).  GENERAL EXAM: General: Patient is awake, alert and in no acute distress.  Well developed and groomed.  MASKED FACIES; POSITIVE MYERSON'S SIGN. POSITIVE SNOUT. Neck: Neck is supple. Cardiovascular: Heart is regular rate and  rhythm with no murmurs.  Neurologic Exam  Mental Status: Awake, alert.   Language is fluent and comprehension intact. Cranial Nerves: Pupils are equal and reactive to light.  Visual fields are full to confrontation.  Conjugate eye movements are full and symmetric.  SUBTLE RIGHT PTOSIS. Facial sensation and strength are symmetric.  Hearing is intact.  Palate elevated symmetrically and uvula is midline.  Shoulder shug is symmetric.  Tongue is midline. Motor: Normal bulk and MILD COGWHEELING IN  LUE > RUE.  MINIMAL ACTION TREMOR (RUE > LUE). RARE REST TREMOR (RUE) WITH CONTRALATERAL MOVEMENTS.  Full strength in the upper and lower extremities.  No pronator drift.  BRADYKINESIA IN UPPER AND LOWER EXT (LLE WORSE THAN RLE). Sensory: Intact and symmetric to light touch. Coordination: ACTION TREMOR ON FINGER NOSE FINGER (LUE>RUE). Gait and Station: Narrow based gait.  ANTALGIC, UNSTEADY, USING A SINGLE POINT CANE. RIGHT ARM TREMOR WITH WALKING. Reflexes: Deep tendon reflexes in the upper and lower extremity are present and symmetric; EXCEPT ABSENT AT ANKLES.   DIAGNOSTIC DATA (LABS, IMAGING, TESTING) - I reviewed patient records, labs, notes, testing and imaging  myself where available.  Lab Results  Component Value Date   WBC 12.4* 12/18/2012   HGB 12.4 12/18/2012   HCT 38.5 12/18/2012   MCV 90 12/18/2012   PLT 277 08/13/2009      Component Value Date/Time   NA 141 12/18/2012 1651   NA 145 08/13/2009 1044   NA 137 01/07/2008 1126   K 4.5 12/18/2012 1651   K 4.1 08/13/2009 1044   CL 100 12/18/2012 1651   CL 104 08/13/2009 1044   CO2 28 12/18/2012 1651   CO2 31 08/13/2009 1044   GLUCOSE 97 12/18/2012 1651   GLUCOSE 121* 08/13/2009 1044   GLUCOSE 114* 01/07/2008 1126   BUN 21 12/18/2012 1651   BUN 19 08/13/2009 1044   BUN 23 01/07/2008 1126   CREATININE 1.01* 12/18/2012 1651   CREATININE 0.7 08/13/2009 1044   CALCIUM 9.1 12/18/2012 1651   CALCIUM 9.1 08/13/2009 1044   PROT 7.0 12/18/2012 1651   PROT 7.4 08/13/2009 1044   PROT 7.0 01/07/2008 1126   ALBUMIN 3.7 01/07/2008 1126   AST 33 12/18/2012 1651   AST 24 08/13/2009 1044   ALT 27 12/18/2012 1651   ALT 25 08/13/2009 1044   ALKPHOS 81 12/18/2012 1651   ALKPHOS 58 08/13/2009 1044   BILITOT 0.2 12/18/2012 1651   BILITOT 0.50 08/13/2009 1044   GFRNONAA 52* 12/18/2012 1651   GFRAA 60 12/18/2012 1651   No results found for this basename: CHOL,  HDL,  LDLCALC,  LDLDIRECT,  TRIG,  CHOLHDL   No results found for this basename: HGBA1C   No results found for this basename: VITAMINB12   No results found for this basename: TSH    04/16/10 MRI brain - Mild chronic small vessel ischemic disease. Mild atrophy.  04/17/13 DATscan - normal   ASSESSMENT AND PLAN  80 y.o. female with atrial fibrillation, recurrent DVT, hypertension, hypothyroidism, hypercholesterolemia, depression, anxiety, breast cancer (status post mastectomy, chemotherapy and radiation therapy), Family history Parkinson's disease, and presenting with tremor, poor handwriting and balance difficulty since 2001. Neurologic exam notable for a mixed tremor (resting, action, postural), cogwheel rigidity, bradykinesia and mild balance difficulty.  Could not tolerate carb/levo trial  (headaches).  Some benefit on ropinirole 2mg  TID and primidone 50mg  BID.  Dx: possible dementia with lewy bodies +/- essential tremor  PLAN: 1. Continue ropinirole and primidone 2. Use cane  and rollator walker  Return in about 1 year (around 09/24/2014).   Penni Bombard, MD 0/93/2671, 24:58 PM Certified in Neurology, Neurophysiology and Neuroimaging  [facility] [address], [place], [place] [address] [phone] s

## 2013-09-23 NOTE — Patient Instructions (Signed)
Continue current medications. 

## 2013-09-26 ENCOUNTER — Encounter: Payer: Self-pay | Admitting: Internal Medicine

## 2013-10-14 ENCOUNTER — Telehealth: Payer: Self-pay | Admitting: Diagnostic Neuroimaging

## 2013-10-14 DIAGNOSIS — R451 Restlessness and agitation: Secondary | ICD-10-CM

## 2013-10-14 DIAGNOSIS — F22 Delusional disorders: Secondary | ICD-10-CM

## 2013-10-14 DIAGNOSIS — G20 Parkinson's disease: Secondary | ICD-10-CM

## 2013-10-14 NOTE — Telephone Encounter (Signed)
Pt's daughter [name] called states pt has gotten worse and she's afraid it might be the Dementia is getting stronger. Pt is out of control, accusing her of stealing her stuff and not sleeping at night time. Please have Dr. Leta Baptist call [name] back per [name] to discuss concerning this matter. Thanks

## 2013-10-14 NOTE — Telephone Encounter (Signed)
Spoke with daughter and asked her to send last OV notes from Dr Laurelyn Sickle office(was seen in office for memory about a month ago), since patient has not been seen here for any type of dementia,said that she would fax notes over, and could this be a part of the parkinson's?

## 2013-10-25 NOTE — Telephone Encounter (Signed)
I called patient's daughter. Sudden change, more paranoia, up at night, hiding things, starting 3 weeks ago. Went to PCP. Now on exelon patch. Doing a little better.   Will check MRI brain at [facility].  Penni Bombard, MD 02/09/9406, 6:80 PM Certified in Neurology, Neurophysiology and Neuroimaging  [facility] [address] [place], [place] [address] [phone]

## 2013-11-06 ENCOUNTER — Ambulatory Visit
Admission: RE | Admit: 2013-11-06 | Discharge: 2013-11-06 | Disposition: A | Discharge status: Home / Self Care | Payer: Medicare Other | Source: Ambulatory Visit | Attending: Diagnostic Neuroimaging | Admitting: Diagnostic Neuroimaging

## 2013-11-06 DIAGNOSIS — R451 Restlessness and agitation: Secondary | ICD-10-CM

## 2013-11-06 DIAGNOSIS — IMO0002 Reserved for concepts with insufficient information to code with codable children: Secondary | ICD-10-CM

## 2013-11-06 DIAGNOSIS — G20 Parkinson's disease: Secondary | ICD-10-CM

## 2013-11-06 DIAGNOSIS — F22 Delusional disorders: Secondary | ICD-10-CM

## 2013-11-07 ENCOUNTER — Telehealth: Payer: Self-pay | Admitting: Diagnostic Neuroimaging

## 2013-11-07 NOTE — Telephone Encounter (Signed)
Daughter calling requesting MRI results and also results faxed to PCP, patient has appointment tomorrow.  Please return call.

## 2013-11-07 NOTE — Telephone Encounter (Signed)
Called pt and spoke with pt's daughter [name] informing her that the pt's MRI results were not ready yet and as soon as Dr. Leta Baptist reports on the MRI, someone will be giving the pt a call back. Daughter requested that a copy of the results be sent over to pt's PCP. I advised the daughter that if the pt has any other problems, questions or concerns to call the office. Pt's daughter verbalized understanding.

## 2013-11-10 ENCOUNTER — Emergency Department: Payer: Self-pay | Admitting: Internal Medicine

## 2013-11-10 LAB — COMPREHENSIVE METABOLIC PANEL
ALBUMIN: 3 g/dL — AB (ref 3.4–5.0)
Alkaline Phosphatase: 125 U/L — ABNORMAL HIGH
Anion Gap: 3 — ABNORMAL LOW (ref 7–16)
BILIRUBIN TOTAL: 0.4 mg/dL (ref 0.2–1.0)
BUN: 17 mg/dL (ref 7–18)
CHLORIDE: 105 mmol/L (ref 98–107)
CREATININE: 0.91 mg/dL (ref 0.60–1.30)
Calcium, Total: 8.8 mg/dL (ref 8.5–10.1)
Co2: 31 mmol/L (ref 21–32)
EGFR (Non-African Amer.): 59 — ABNORMAL LOW
Glucose: 100 mg/dL — ABNORMAL HIGH (ref 65–99)
OSMOLALITY: 279 (ref 275–301)
Potassium: 3.5 mmol/L (ref 3.5–5.1)
SGOT(AST): 38 U/L — ABNORMAL HIGH (ref 15–37)
SGPT (ALT): 31 U/L (ref 12–78)
SODIUM: 139 mmol/L (ref 136–145)
Total Protein: 7.2 g/dL (ref 6.4–8.2)

## 2013-11-10 LAB — PROTIME-INR
INR: 2.2
Prothrombin Time: 24.1 secs — ABNORMAL HIGH (ref 11.5–14.7)

## 2013-11-10 LAB — URINALYSIS, COMPLETE
BACTERIA: NONE SEEN
Bilirubin,UR: NEGATIVE
Glucose,UR: NEGATIVE mg/dL (ref 0–75)
Hyaline Cast: 2
Ketone: NEGATIVE
Nitrite: NEGATIVE
Ph: 5 (ref 4.5–8.0)
Protein: NEGATIVE
RBC,UR: 15 /HPF (ref 0–5)
Specific Gravity: 1.018 (ref 1.003–1.030)
Squamous Epithelial: 11
WBC UR: 20 /HPF (ref 0–5)

## 2013-11-10 LAB — CBC
HCT: 38.4 % (ref 35.0–47.0)
HGB: 12.4 g/dL (ref 12.0–16.0)
MCH: 30 pg (ref 26.0–34.0)
MCHC: 32.4 g/dL (ref 32.0–36.0)
MCV: 93 fL (ref 80–100)
Platelet: 221 10*3/uL (ref 150–440)
RBC: 4.15 10*6/uL (ref 3.80–5.20)
RDW: 14.8 % — ABNORMAL HIGH (ref 11.5–14.5)
WBC: 8.4 10*3/uL (ref 3.6–11.0)

## 2013-11-10 LAB — TROPONIN I

## 2013-11-12 NOTE — Telephone Encounter (Signed)
Pt's information was given to Sandy, RN. Please advise °

## 2013-11-12 NOTE — Telephone Encounter (Signed)
I called and LMVM [name], daughter, that MRI no change from previous MRI done 04/2010.  She is to call back if questions.

## 2013-11-12 NOTE — Telephone Encounter (Signed)
Patient's daughter calling again to request patient's MRI results. Please return call and advise.

## 2013-11-29 ENCOUNTER — Emergency Department: Payer: Self-pay | Admitting: Emergency Medicine

## 2013-11-29 LAB — COMPREHENSIVE METABOLIC PANEL
ANION GAP: 4 — AB (ref 7–16)
AST: 43 U/L — AB (ref 15–37)
Albumin: 3.1 g/dL — ABNORMAL LOW (ref 3.4–5.0)
Alkaline Phosphatase: 90 U/L
BUN: 12 mg/dL (ref 7–18)
Bilirubin,Total: 0.3 mg/dL (ref 0.2–1.0)
CREATININE: 0.75 mg/dL (ref 0.60–1.30)
Calcium, Total: 9.1 mg/dL (ref 8.5–10.1)
Chloride: 101 mmol/L (ref 98–107)
Co2: 32 mmol/L (ref 21–32)
EGFR (African American): >60
EGFR (Non-African Amer.): >60
Glucose: 98 mg/dL (ref 65–99)
OSMOLALITY: 274 (ref 275–301)
Potassium: 4.2 mmol/L (ref 3.5–5.1)
SGPT (ALT): 37 U/L (ref 12–78)
Sodium: 137 mmol/L (ref 136–145)
TOTAL PROTEIN: 7.2 g/dL (ref 6.4–8.2)

## 2013-11-29 LAB — CBC
HCT: 37.7 % (ref 35.0–47.0)
HGB: 12.5 g/dL (ref 12.0–16.0)
MCH: 30.8 pg (ref 26.0–34.0)
MCHC: 33.2 g/dL (ref 32.0–36.0)
MCV: 93 fL (ref 80–100)
Platelet: 256 10*3/uL (ref 150–440)
RBC: 4.07 10*6/uL (ref 3.80–5.20)
RDW: 14.9 % — ABNORMAL HIGH (ref 11.5–14.5)
WBC: 9.5 10*3/uL (ref 3.6–11.0)

## 2013-11-29 LAB — ETHANOL
Ethanol %: <0.003 % (ref 0.000–0.080)
Ethanol: <3 mg/dL

## 2013-11-29 LAB — URINALYSIS, COMPLETE
BACTERIA: NONE SEEN
Bilirubin,UR: NEGATIVE
GLUCOSE, UR: NEGATIVE mg/dL (ref 0–75)
KETONE: NEGATIVE
Nitrite: NEGATIVE
PH: 6 (ref 4.5–8.0)
Protein: NEGATIVE
Specific Gravity: 1.013 (ref 1.003–1.030)
Squamous Epithelial: 2
WBC UR: 6 /HPF (ref 0–5)

## 2013-11-29 LAB — DRUG SCREEN, URINE
Amphetamines, Ur Screen: NEGATIVE (ref ?–1000)
BARBITURATES, UR SCREEN: POSITIVE (ref ?–200)
BENZODIAZEPINE, UR SCRN: NEGATIVE (ref ?–200)
COCAINE METABOLITE, UR ~~LOC~~: NEGATIVE (ref ?–300)
Cannabinoid 50 Ng, Ur ~~LOC~~: NEGATIVE (ref ?–50)
MDMA (Ecstasy)Ur Screen: NEGATIVE (ref ?–500)
Methadone, Ur Screen: NEGATIVE (ref ?–300)
OPIATE, UR SCREEN: NEGATIVE (ref ?–300)
Phencyclidine (PCP) Ur S: NEGATIVE (ref ?–25)
TRICYCLIC, UR SCREEN: NEGATIVE (ref ?–1000)

## 2013-11-29 LAB — TSH: THYROID STIMULATING HORM: 2.05 u[IU]/mL

## 2013-11-29 LAB — ACETAMINOPHEN LEVEL

## 2013-11-29 LAB — SALICYLATE LEVEL: Salicylates, Serum: <1.7 mg/dL

## 2013-11-29 LAB — PROTIME-INR
INR: 2.4
PROTHROMBIN TIME: 25.6 s — AB (ref 11.5–14.7)

## 2013-12-02 LAB — PROTIME-INR
INR: 3.1
PROTHROMBIN TIME: 30.7 s — AB (ref 11.5–14.7)

## 2013-12-27 ENCOUNTER — Ambulatory Visit: Payer: Self-pay | Admitting: Radiation Oncology

## 2014-01-09 ENCOUNTER — Ambulatory Visit: Payer: Medicare Other | Admitting: General Surgery

## 2014-02-04 ENCOUNTER — Encounter: Payer: Self-pay | Admitting: *Deleted

## 2014-03-24 ENCOUNTER — Non-Acute Institutional Stay (SKILLED_NURSING_FACILITY): Payer: Medicare Other | Admitting: Internal Medicine

## 2014-03-24 DIAGNOSIS — H04123 Dry eye syndrome of bilateral lacrimal glands: Secondary | ICD-10-CM

## 2014-03-24 DIAGNOSIS — E46 Unspecified protein-calorie malnutrition: Secondary | ICD-10-CM | POA: Insufficient documentation

## 2014-03-24 DIAGNOSIS — F329 Major depressive disorder, single episode, unspecified: Secondary | ICD-10-CM

## 2014-03-24 DIAGNOSIS — G3183 Dementia with Lewy bodies: Secondary | ICD-10-CM | POA: Insufficient documentation

## 2014-03-24 DIAGNOSIS — R251 Tremor, unspecified: Secondary | ICD-10-CM

## 2014-03-24 DIAGNOSIS — K5900 Constipation, unspecified: Secondary | ICD-10-CM

## 2014-03-24 DIAGNOSIS — G20 Parkinson's disease: Secondary | ICD-10-CM

## 2014-03-24 DIAGNOSIS — F0280 Dementia in other diseases classified elsewhere without behavioral disturbance: Secondary | ICD-10-CM

## 2014-03-24 DIAGNOSIS — F0393 Unspecified dementia, unspecified severity, with mood disturbance: Secondary | ICD-10-CM

## 2014-03-24 NOTE — Progress Notes (Signed)
Patient ID: Kristin Frey, female   DOB: [birth year], 80 y.o.   MRN: [id number]    Facility: [facility]   Chief Complaint  Patient presents with  . New Admit To SNF    for long term care    Allergies  Allergen Reactions  . Penicillins Anaphylaxis  . Fish Allergy Hives   HPI 80 y/o female patient here from hospice home for long term care. She is seen in her room with hospice social worker and nurse present. She is under hospice care She has hx of lewy body dementia with parkinsonism, malnutrition, depression, HTN, DM, breast cancer.She speaks [place] and [place] fluently. She is lying on her bed, in no distress and having her lunch. She needs assistance with feed. She has baseline confusion but able to make her needs known She has indwelling foley for unclear reasons  Review of Systems  Constitutional: Negative for fever, chills, diaphoresis.  HENT: Negative for congestion.   Eyes: Negative for double vision and discharge.  Respiratory: Negative for cough, sputum production, shortness of breath and wheezing.   Cardiovascular: Negative for chest pain, palpitations, orthopnea and leg swelling.  Gastrointestinal: Negative for heartburn, nausea, vomiting, abdominal pain and constipation.  Genitourinary: Negative for dysuria Musculoskeletal: Negative for back pain, falls  Skin: Negative for itching and rash.  Neurological: Negative for dizziness and headaches.  Psychiatric/Behavioral: has depression but denies suicidal ideation  Past Medical History  Diagnosis Date  . Parkinson disease   . Carotid artery disease   . Hernia   . Retinopathy   . DVT (deep venous thrombosis) 1998  . High cholesterol   . Hypertension   . Hypothyroidism   . Atrial fibrillation   . Cancer 1998    left breast mastectomy chemo and radiation  . Cancer 2014    right breast wide excision   . Diabetes    Past Surgical History  Procedure Laterality Date  . Appendectomy  1949    . Carpal tunnel release Bilateral 1976  . Eye surgery  [phone]  . Cholecystectomy  2007  . Breast surgery Left 1998    mastectomy  . Breast surgery Right 2014    wide excision, sentinel node bx, mastoplasty,MammoSite     Medication List       This list is accurate as of: 03/24/14 12:37 PM.  Always use your most recent med list.               acetaminophen 325 MG tablet  Commonly known as:  TYLENOL  Take 650 mg by mouth every 6 (six) hours as needed.     calcium carbonate 500 MG chewable tablet  Commonly known as:  TUMS - dosed in mg elemental calcium  Chew 2 tablets by mouth every 6 (six) hours as needed for indigestion or heartburn.     carbidopa-levodopa 25-100 MG per tablet  Commonly known as:  SINEMET IR  Take 1 tablet by mouth 3 (three) times daily.     DULCOLAX 10 MG suppository  Generic drug:  bisacodyl  Place 10 mg rectally as needed for moderate constipation.     escitalopram 10 MG tablet  Commonly known as:  LEXAPRO  Take 10 mg by mouth daily.     morphine 20 MG/ML concentrated solution  Commonly known as:  ROXANOL  Take 5 mg by mouth every hour as needed for severe pain.     REFRESH 1.4-0.6 % ophthalmic solution  Generic drug:  polyvinyl alcohol-povidone  1-2 drops as needed.       Family History  Problem Relation Age of Onset  . Heart attack Mother   . Heart disease Mother   . Heart disease Brother   . Parkinsonism Brother    History   Social History  . Marital Status: Married    Spouse Name: N/A    Number of Children: 3  . Years of Education: 12   Occupational History  . N/A    Social History Main Topics  . Smoking status: Never Smoker   . Smokeless tobacco: Never Used  . Alcohol Use: No  . Drug Use: No  . Sexual Activity: No   Other Topics Concern  . Not on file   Social History Narrative   Patient lives at home alone.   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 Brother  developed Parkinson's use to "chew coal" as a child.)   Caffeine Use: 2-3 cups caffeine daily   Physical exam BP 126/74  Pulse 77  Temp(Src) 98 F (36.7 C)  Resp 18  SpO2 95%  General- elderly female in no acute distress Head- atraumatic, normocephalic Eyes- no pallor, no icterus, no discharge Neck- no lymphadenopathy Cardiovascular- normal s1,s2, no murmurs, normal distal pulses, trace bilateral leg edema Respiratory- bilateral clear to auscultation, no wheeze, no rhonchi, no crackles Abdomen- bowel sounds present, soft, non tender, has foley Musculoskeletal- able to move her UE.unable to move her lower extremities, bed bound Neurological- has hx of parkinson's, mild resting tremor but more prominent with activities Skin- warm and dry Psychiatry- alert, slowed down and appears sad  Labs- none to review    Assessment/plan  Protein calorie malnutrition Under hospice care, monitor po intake, with her dementia decline anticipated, monitor skin care, continue supplements  Lew body dementia Stable, decline anticipated, under total care, continue skin care, pressure ulcer prophylaxis, monitor her po intake, provide assistance with feeding. Continue sinemet current regimen. D/c foley for now and do voiding trial.  Tremors In setting of progressive lewy body with parkinsonism features. Was on primidone before. Resume it at 50 mg bid for now and reassess  Constipation Continue dulcolax suppository prn for now  Depression Persists, increase lexapro to 20 mg daily and reassess  Dry eye Continue refresh eye drops prn

## 2014-04-08 DIAGNOSIS — F22 Delusional disorders: Secondary | ICD-10-CM

## 2014-04-08 DIAGNOSIS — F0281 Dementia in other diseases classified elsewhere with behavioral disturbance: Secondary | ICD-10-CM

## 2014-04-08 DIAGNOSIS — G3183 Dementia with Lewy bodies: Secondary | ICD-10-CM

## 2014-04-08 DIAGNOSIS — G20 Parkinson's disease: Secondary | ICD-10-CM

## 2014-04-14 ENCOUNTER — Encounter: Payer: Self-pay | Admitting: Diagnostic Neuroimaging

## 2014-04-22 ENCOUNTER — Telehealth: Payer: Self-pay | Admitting: *Deleted

## 2014-04-22 NOTE — Telephone Encounter (Signed)
Linda with [facility] called and stated that she needed orders signed for patient. Patient was at [facility] but discharged. She stated that these were ordered before patient was discharged from facility. She is going to fax them back with instructions for Dr. Bubba Camp.

## 2014-04-25 ENCOUNTER — Non-Acute Institutional Stay (SKILLED_NURSING_FACILITY): Payer: Medicare Other | Admitting: Adult Health

## 2014-04-25 DIAGNOSIS — G20 Parkinson's disease: Secondary | ICD-10-CM

## 2014-04-25 DIAGNOSIS — G3183 Dementia with Lewy bodies: Secondary | ICD-10-CM

## 2014-04-25 DIAGNOSIS — R251 Tremor, unspecified: Secondary | ICD-10-CM

## 2014-04-25 DIAGNOSIS — M545 Low back pain: Secondary | ICD-10-CM

## 2014-04-25 DIAGNOSIS — F0393 Unspecified dementia, unspecified severity, with mood disturbance: Secondary | ICD-10-CM

## 2014-04-25 DIAGNOSIS — K219 Gastro-esophageal reflux disease without esophagitis: Secondary | ICD-10-CM

## 2014-04-25 DIAGNOSIS — K5900 Constipation, unspecified: Secondary | ICD-10-CM

## 2014-04-25 DIAGNOSIS — F0280 Dementia in other diseases classified elsewhere without behavioral disturbance: Secondary | ICD-10-CM

## 2014-04-25 DIAGNOSIS — F329 Major depressive disorder, single episode, unspecified: Secondary | ICD-10-CM

## 2014-04-28 LAB — BASIC METABOLIC PANEL
BUN: 25 mg/dL — AB (ref 4–21)
Creatinine: 0.6 mg/dL (ref 0.5–1.1)
GLUCOSE: 88 mg/dL
POTASSIUM: 4.2 mmol/L (ref 3.4–5.3)
Sodium: 138 mmol/L (ref 137–147)

## 2014-04-28 LAB — CBC AND DIFFERENTIAL
HCT: 34 % — AB (ref 36–46)
Hemoglobin: 10.3 g/dL — AB (ref 12.0–16.0)
Platelets: 282 10*3/uL (ref 150–399)
WBC: 11.3 10^3/mL

## 2014-05-01 ENCOUNTER — Encounter: Payer: Self-pay | Admitting: Adult Health

## 2014-05-01 DIAGNOSIS — K219 Gastro-esophageal reflux disease without esophagitis: Secondary | ICD-10-CM | POA: Insufficient documentation

## 2014-05-01 MED ORDER — RANITIDINE HCL 150 MG PO TABS: 150.0000 mg | ORAL_TABLET | Freq: Two times a day (BID) | ORAL | Status: DC

## 2014-05-01 NOTE — Progress Notes (Signed)
Patient ID: Kristin Frey, female   DOB: [birth year], 80 y.o.   MRN: [id number]  [facility]     Allergies  Allergen Reactions  . Penicillins Anaphylaxis  . Fish Allergy Hives       Chief Complaint  Patient presents with  . Medical Management of Chronic Issues    HPI:  She is a long term resident of this facility being seen for the management of her chronic illnesses. She is followed by hospice care. There are no concerns being voiced by the nursing staff. She states that she has upset stomach    Past Medical History  Diagnosis Date  . Parkinson disease   . Carotid artery disease   . Hernia   . Retinopathy   . DVT (deep venous thrombosis) 1998  . High cholesterol   . Hypertension   . Hypothyroidism   . Atrial fibrillation   . Cancer 1998    left breast mastectomy chemo and radiation  . Cancer 2014    right breast wide excision   . Diabetes     Past Surgical History  Procedure Laterality Date  . Appendectomy  1949  . Carpal tunnel release Bilateral 1976  . Eye surgery  [phone]  . Cholecystectomy  2007  . Breast surgery Left 1998    mastectomy  . Breast surgery Right 2014    wide excision, sentinel node bx, mastoplasty,MammoSite    VITAL SIGNS BP 138/78 mmHg  Pulse 75  SpO2 96%   Outpatient Encounter Prescriptions as of 04/25/2014  Medication Sig  . bisacodyl (DULCOLAX) 10 MG suppository Place 10 mg rectally as needed for moderate constipation.  . calcium carbonate (TUMS - DOSED IN MG ELEMENTAL CALCIUM) 500 MG chewable tablet Chew 2 tablets by mouth every 6 (six) hours as needed for indigestion or heartburn.  . carbidopa-levodopa (SINEMET IR) 25-100 MG per tablet Take 1 tablet by mouth 3 (three) times daily.  Marland Kitchen escitalopram (LEXAPRO) 10 MG tablet Take 20 mg by mouth daily.   Marland Kitchen morphine (ROXANOL) 20 MG/ML concentrated solution Take 5 mg by mouth every hour as needed for severe pain.  . primidone (MYSOLINE) 50 MG tablet Take 50 mg by mouth 2 (two)  times daily.  . sennosides-docusate sodium (SENOKOT-S) 8.6-50 MG tablet Take 2 tablets by mouth daily.  . [DISCONTINUED] acetaminophen (TYLENOL) 325 MG tablet Take 650 mg by mouth every 6 (six) hours as needed.  . [DISCONTINUED] polyvinyl alcohol-povidone (REFRESH) 1.4-0.6 % ophthalmic solution 1-2 drops as needed.     SIGNIFICANT DIAGNOSTIC EXAMS  None available  LABS REVIEWED:   None available    Review of Systems  Constitutional: Negative for malaise/fatigue.  Respiratory: Negative for cough and shortness of breath.   Cardiovascular: Negative for chest pain and palpitations.  Gastrointestinal: Positive for heartburn and abdominal pain. Negative for constipation.  Musculoskeletal: Negative for myalgias and joint pain.  Skin: Negative.   Psychiatric/Behavioral: Negative for depression.     Physical Exam  Constitutional: No distress.  Frail   Neck: Neck supple. No JVD present. No thyromegaly present.  Cardiovascular: Normal rate, regular rhythm and intact distal pulses.   Respiratory: Effort normal and breath sounds normal. No respiratory distress. She has no wheezes.  GI: Soft. Bowel sounds are normal. She exhibits no distension. There is no tenderness.  Musculoskeletal: She exhibits no edema.  Is able to move all extremities Has bilateral resting tremor in both hands   Neurological: She is alert.  Skin: Skin is warm and dry. She  is not diaphoretic.       ASSESSMENT/ PLAN:  1. Parkinson disease, Lewy body: no significant change, will continue sinemet 25/100 mg three times daily. Will continue to monitor her status   2. Tremor: is presently stable; will continue primidone 50 mg twice daily   3. Urine retention: has foley present will monitor   4. Depression: she is presently stable will continue lexapro 20 mg daily   5. Constipation will continue senna s 2 tabs daily   6. Low back pain: she is presently not complaining of pain; will continue roxanol 5 mg every  hour as needed  7. Dementia: no significant change in status; is presently on medications will not make changes will monitor   8. Gerd: will begin zantac 150 mg twice daily   Will check cbc and cmp    Ok Edwards NP [facility]  Contact [phone] Monday through Friday 8am- 5pm  After hours call [phone]

## 2014-05-16 ENCOUNTER — Encounter: Payer: Self-pay | Admitting: Registered Nurse

## 2014-05-16 ENCOUNTER — Non-Acute Institutional Stay (SKILLED_NURSING_FACILITY): Payer: Medicare Other | Admitting: Registered Nurse

## 2014-05-16 DIAGNOSIS — N3000 Acute cystitis without hematuria: Secondary | ICD-10-CM

## 2014-05-16 NOTE — Progress Notes (Signed)
Patient ID: Kristin Frey, female   DOB: [birth year], 80 y.o.   MRN: [id number]   Place of Service: [facility]  Allergies  Allergen Reactions  . Penicillins Anaphylaxis  . Fish Allergy Hives    Code Status: DNR  Goals of Care: Comfort and Quality of Life/Hospice  Chief Complaint  Patient presents with  . Acute Visit    UTI    HPI  80 y.o. female hospice patient with PMH of PD, dementia, DM, HTN, depression, constipation is being seen for an acute visit for a UTI.  Per nursing staff, patient was not being her usual self with increased confusion and irritability. A UA with C&S were sent on 05/13/14 to check for possible UTI.  Her urine culture came back positive for Klebsiella pneumoniae.   Review of Systems Unable to obtain to due dementia  Past Medical History  Diagnosis Date  . Parkinson disease   . Carotid artery disease   . Hernia   . Retinopathy   . DVT (deep venous thrombosis) 1998  . High cholesterol   . Hypertension   . Hypothyroidism   . Atrial fibrillation   . Cancer 1998    left breast mastectomy chemo and radiation  . Cancer 2014    right breast wide excision   . Diabetes     Past Surgical History  Procedure Laterality Date  . Appendectomy  1949  . Carpal tunnel release Bilateral 1976  . Eye surgery  [phone]  . Cholecystectomy  2007  . Breast surgery Left 1998    mastectomy  . Breast surgery Right 2014    wide excision, sentinel node bx, mastoplasty,MammoSite    History   Social History  . Marital Status: Married    Spouse Name: N/A    Number of Children: 3  . Years of Education: 12   Occupational History  . N/A    Social History Main Topics  . Smoking status: Never Smoker   . Smokeless tobacco: Never Used  . Alcohol Use: No  . Drug Use: No  . Sexual Activity: No   Other Topics Concern  . Not on file   Social History Narrative   Patient lives at home alone.   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 Brother developed Parkinson's use to "chew coal" as a child.)   Caffeine Use: 2-3 cups caffeine daily   Family History  Problem Relation Age of Onset  . Heart attack Mother   . Heart disease Mother   . Heart disease Brother   . Parkinsonism Brother       Medication List       This list is accurate as of: 05/16/14  8:07 PM.  Always use your most recent med list.               calcium carbonate 500 MG chewable tablet  Commonly known as:  TUMS - dosed in mg elemental calcium  Chew 2 tablets by mouth every 6 (six) hours as needed for indigestion or heartburn.     carbidopa-levodopa 25-100 MG per tablet  Commonly known as:  SINEMET IR  Take 1 tablet by mouth 3 (three) times daily.     DULCOLAX 10 MG suppository  Generic drug:  bisacodyl  Place 10 mg rectally as needed for moderate constipation.     escitalopram 10 MG tablet  Commonly known as:  LEXAPRO  Take 20 mg by mouth daily.  morphine 20 MG/ML concentrated solution  Commonly known as:  ROXANOL  Take 5 mg by mouth every hour as needed for severe pain.     primidone 50 MG tablet  Commonly known as:  MYSOLINE  Take 50 mg by mouth 2 (two) times daily.     ranitidine 150 MG tablet  Commonly known as:  ZANTAC  Take 1 tablet (150 mg total) by mouth 2 (two) times daily.     sennosides-docusate sodium 8.6-50 MG tablet  Commonly known as:  SENOKOT-S  Take 2 tablets by mouth daily.        Physical Exam Filed Vitals:   05/16/14 1946  BP: 139/77  Pulse: 78  Temp: 97.1 F (36.2 C)  Resp: 18   Constitutional: WDWN adult/elderly female in no acute distress.  HEENT: Normocephalic and atraumatic. PERRL. EOM intact. No icterus. Neck: Supple and nontender. No JVD or carotid bruits. Cardiac: Normal S1, S2. RRR without appreciable murmurs, rubs, or gallops. Distal pulses intact. Trace pitting edema of BLE  Lungs: No respiratory distress. Breath sounds clear bilaterally without rales,  rhonchi, or wheezes. Abdomen: Audible bowel sounds in all quadrants. Soft, nontender, nondistended.  Musculoskeletal: able to move all extremities Skin: Warm and dry. No rash noted. No erythema.  Neurological: Alert  Psychiatric: flat affect  Labs Reviewed  CBC Latest Ref Rng 04/28/2014 12/18/2012 08/13/2009  WBC - 11.3 12.4(H) 7.6  Hemoglobin 12.0 - 16.0 g/dL 10.3(A) 12.4 12.4  Hematocrit 36 - 46 % 34(A) 38.5 37.6  Platelets 150 - 399 K/L 282 - 277     CMP Latest Ref Rng 04/28/2014 12/18/2012 08/13/2009  Glucose 65 - 99 mg/dL - 97 121(H)  BUN 4 - 21 mg/dL 25(A) 21 19  Creatinine 0.5 - 1.1 mg/dL 0.6 1.01(H) 0.7  Sodium 137 - 147 mmol/L 138 141 145  Potassium 3.4 - 5.3 mmol/L 4.2 4.5 4.1  Chloride 97 - 108 mmol/L - 100 104  CO2 18 - 29 mmol/L - 28 31  Calcium 8.6 - 10.2 mg/dL - 9.1 9.1  Total Protein 6.0 - 8.5 g/dL - 7.0 7.4  Albumin 3.5 - 4.7 g/dL - 3.6 3.1(L)  Total Bilirubin 0.0 - 1.2 mg/dL - 0.2 0.50  Alkaline Phos 39 - 117 IU/L - 81 58  AST 0 - 40 IU/L - 33 24  ALT 0 - 32 IU/L - 27 25    Assessment & Plan 1. Acute cystitis without hematuria Cipro 500mg  twice daily x 5 days with florastor 250mg  twice daily x2 weeks. Encouraged increased fluid intake. Continue to monitor.   Family/Staff Communication Plan of care discussed with nursing staff. Nursing staff verbalized understanding and agree with plan of care. No additional questions or concerns reported.    Arthur Holms, MSN, AGNP-C [facility] [address] [place], [place] [address] [phone] [8am-5pm] After hours: [phone]

## 2014-05-21 ENCOUNTER — Non-Acute Institutional Stay (SKILLED_NURSING_FACILITY): Payer: Medicare Other | Admitting: Registered Nurse

## 2014-05-21 ENCOUNTER — Encounter: Payer: Self-pay | Admitting: Registered Nurse

## 2014-05-21 DIAGNOSIS — K219 Gastro-esophageal reflux disease without esophagitis: Secondary | ICD-10-CM

## 2014-05-21 DIAGNOSIS — G3183 Dementia with Lewy bodies: Secondary | ICD-10-CM

## 2014-05-21 DIAGNOSIS — G20 Parkinson's disease: Secondary | ICD-10-CM

## 2014-05-21 DIAGNOSIS — R251 Tremor, unspecified: Secondary | ICD-10-CM

## 2014-05-21 DIAGNOSIS — F22 Delusional disorders: Secondary | ICD-10-CM

## 2014-05-21 DIAGNOSIS — F0393 Unspecified dementia, unspecified severity, with mood disturbance: Secondary | ICD-10-CM

## 2014-05-21 DIAGNOSIS — W19XXXA Unspecified fall, initial encounter: Secondary | ICD-10-CM

## 2014-05-21 DIAGNOSIS — R1084 Generalized abdominal pain: Secondary | ICD-10-CM

## 2014-05-21 DIAGNOSIS — R339 Retention of urine, unspecified: Secondary | ICD-10-CM

## 2014-05-21 DIAGNOSIS — Y92129 Unspecified place in nursing home as the place of occurrence of the external cause: Secondary | ICD-10-CM

## 2014-05-21 DIAGNOSIS — F329 Major depressive disorder, single episode, unspecified: Secondary | ICD-10-CM

## 2014-05-21 DIAGNOSIS — K5900 Constipation, unspecified: Secondary | ICD-10-CM

## 2014-05-21 DIAGNOSIS — F0280 Dementia in other diseases classified elsewhere without behavioral disturbance: Secondary | ICD-10-CM

## 2014-05-21 NOTE — Progress Notes (Signed)
Patient ID: Kristin Frey, female   DOB: May 25, 1930, 80 y.o.   MRN: [id number]   Place of Service: [facility]  Allergies  Allergen Reactions  . Penicillins Anaphylaxis  . Fish Allergy Hives    Code Status: DNR  Goals of Care: Comfort and Quality of Life/Hospice  Chief Complaint  Patient presents with  . Medical Management of Chronic Issues    PD with dementia, constipation, depression, tremors, GERD, urinary retention  . Acute Visit    agitation and paranoia     HPI 80 y.o. female hospice patient with PMH of PD, dementia, DM, HTN, depression, constipation, urinary rentention with foley cath among others is being seen for a routine visit for management of her chronic issues. Weight stable. She had a fall early this morning around 4:05a.m. Staff found her lying on the floor on her right side- VS and neuro checked were normal post fall. She has a skin tear to Right forearm 2.5x1cm in length. Xray of RUE was ordered by on-call provider. Xray results showed no acute fracture or dislocation noted per xray. Nursing staff also reported patient has been more agitated and paranoid overnight, thinking that someone is trying to poison her. Seen today in wheelchair. Patient reported having generalized abdominal pain right after eating lunch. Otherwise, she denies any complaints. She just completed her abx for a UTI.   Review of Systems Constitutional: Negative for fever and chills HENT: Negative for ear pain, congestion, and sore throat Eyes: Negative for eye pain, eye discharge, and visual disturbance  Cardiovascular: Negative for chest pain. Positive for leg swelling Respiratory: Negative cough, shortness of breath, and wheezing.  Gastrointestinal: Negative for nausea and vomiting. Positive for abdominal pain Genitourinary: Negative for  dysuria, frequency, urgency, and hematuria Musculoskeletal: Negative for back pain and joint pain Neurological: Negative for dizziness and  headache Skin: Negative for rash  Psychiatric: Negative for depression. Positive for "memory loss"  Past Medical History  Diagnosis Date  . Parkinson disease   . Carotid artery disease   . Hernia   . Retinopathy   . DVT (deep venous thrombosis) 1998  . High cholesterol   . Hypertension   . Hypothyroidism   . Atrial fibrillation   . Cancer 1998    left breast mastectomy chemo and radiation  . Cancer 2014    right breast wide excision   . Diabetes     Past Surgical History  Procedure Laterality Date  . Appendectomy  1949  . Carpal tunnel release Bilateral 1976  . Eye surgery  [phone]  . Cholecystectomy  2007  . Breast surgery Left 1998    mastectomy  . Breast surgery Right 2014    wide excision, sentinel node bx, mastoplasty,MammoSite    History   Social History  . Marital Status: Married    Spouse Name: N/A    Number of Children: 3  . Years of Education: 12   Occupational History  . N/A    Social History Main Topics  . Smoking status: Never Smoker   . Smokeless tobacco: Never Used  . Alcohol Use: No  . Drug Use: No  . Sexual Activity: No   Other Topics Concern  . Not on file   Social History Narrative   Patient lives at home alone.   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 Brother developed Parkinson's use to "chew coal" as a child.)   Caffeine Use: 2-3 cups caffeine  daily    Family History  Problem Relation Age of Onset  . Heart attack Mother   . Heart disease Mother   . Heart disease Brother   . Parkinsonism Brother       Medication List       This list is accurate as of: 05/21/14  1:48 PM.  Always use your most recent med list.               calcium carbonate 500 MG chewable tablet  Commonly known as:  TUMS - dosed in mg elemental calcium  Chew 2 tablets by mouth every 6 (six) hours as needed for indigestion or heartburn.     carbidopa-levodopa 25-100 MG per tablet  Commonly known  as:  SINEMET IR  Take 1 tablet by mouth 3 (three) times daily.     DULCOLAX 10 MG suppository  Generic drug:  bisacodyl  Place 10 mg rectally as needed for moderate constipation.     escitalopram 10 MG tablet  Commonly known as:  LEXAPRO  Take 20 mg by mouth daily.     morphine 20 MG/ML concentrated solution  Commonly known as:  ROXANOL  Take 5 mg by mouth every hour as needed for severe pain.     primidone 50 MG tablet  Commonly known as:  MYSOLINE  Take 50 mg by mouth 2 (two) times daily.     ranitidine 150 MG tablet  Commonly known as:  ZANTAC  Take 1 tablet (150 mg total) by mouth 2 (two) times daily.     sennosides-docusate sodium 8.6-50 MG tablet  Commonly known as:  SENOKOT-S  Take 2 tablets by mouth daily.        Physical Exam  BP 105/56 mmHg  Pulse 60  Temp(Src) 97.8 F (36.6 C)  Resp 17  Ht 5\' 1"  (1.549 m)  Wt 151 lb 12.8 oz (68.856 kg)  BMI 28.70 kg/m2  Constitutional: WDWN elderly female in no acute distress. Conversant and pleasant. More alert today than last visit and able to have a meaningful conversation.  HEENT: Normocephalic and atraumatic. PERRL. EOM intact. No icterus. Oral mucosa moist. Posterior pharynx clear of any exudate or lesions.   Neck: Supple and nontender. No lymphadenopathy, masses, or thyromegaly. No JVD or carotid bruits. Cardiac: Normal S1, S2. RRR without appreciable murmurs, rubs, or gallops. Distal pulses intact. Trace pitting edema of BLE Lungs: No respiratory distress. Breath sounds clear bilaterally without rales, rhonchi, or wheezes. Abdomen: Audible bowel sounds in all quadrants. Soft, nontender, nondistended.  Musculoskeletal: able to move all extremities. No joint erythema or tenderness. GU: foley cath in place Skin: Warm and dry. No rash noted.  Neurological: Alert and oriented to person. Hand tremors noted.  Psychiatric: Appropriate mood and affect.   Labs Reviewed  CBC Latest Ref Rng 04/28/2014 12/18/2012 08/13/2009   WBC - 11.3 12.4(H) 7.6  Hemoglobin 12.0 - 16.0 g/dL 10.3(A) 12.4 12.4  Hematocrit 36 - 46 % 34(A) 38.5 37.6  Platelets 150 - 399 K/L 282 - 277    CMP Latest Ref Rng 04/28/2014 12/18/2012 08/13/2009  Glucose 65 - 99 mg/dL - 97 121(H)  BUN 4 - 21 mg/dL 25(A) 21 19  Creatinine 0.5 - 1.1 mg/dL 0.6 1.01(H) 0.7  Sodium 137 - 147 mmol/L 138 141 145  Potassium 3.4 - 5.3 mmol/L 4.2 4.5 4.1  Chloride 97 - 108 mmol/L - 100 104  CO2 18 - 29 mmol/L - 28 31  Calcium 8.6 - 10.2 mg/dL - 9.1  9.1  Total Protein 6.0 - 8.5 g/dL - 7.0 7.4  Albumin 3.5 - 4.7 g/dL - 3.6 3.1(L)  Total Bilirubin 0.0 - 1.2 mg/dL - 0.2 0.50  Alkaline Phos 39 - 117 IU/L - 81 58  AST 0 - 40 IU/L - 33 24  ALT 0 - 32 IU/L - 27 25    Assessment & Plan 1. Parkinson's disease, Lewy body No change. Continue sinemet 25/100mg  three times daily and monitor.   2. Depression due to dementia Stable. Continue lexapro 20mg  daily and monitor for change in mood  3. Tremor Stable. Continue primidone 50mg  daily and monitor.  4. Gastroesophageal reflux disease, esophagitis presence not specified Stable. Continue zantac 150mg  daily and monitor  5. Constipation, unspecified constipation type Stable. Continue senna s 2 tabs daily and dulcolax 10mg  suppository PRN moderate constipation. Continue to monitor.   6. Urinary retention Has foley catheter in place. Continue to monitor for now.   7. Acute paranoia More alert and was able to hold a meaningful conversation. Will not put her on any medication for this at this time. Continue to monitor her status.   8. Fall at nursing home, initial encounter No injuries noted on exam. PT/OT screen was initiated. Continue fall risk precautions.   9. Generalized abdominal pain Most likely secondary to food as patient has stated. Continue to monitor for now.   Labs ordered: CBC with diff, CMP  Family/Staff Communication Plan of care discussed with resident and nursing staff and hospice RN.  Resident, nursing staff, and hospice RN verbalized understanding and agree with plan of care. No additional questions or concerns reported.    Arthur Holms, MSN, AGNP-C [facility] [address] [place], [place] [address] [phone] [8am-5pm] After hours: [phone]

## 2014-05-22 LAB — BASIC METABOLIC PANEL
BUN: 21 mg/dL (ref 4–21)
CREATININE: 1.6 mg/dL — AB (ref 0.5–1.1)
GLUCOSE: 93 mg/dL
POTASSIUM: 3.6 mmol/L (ref 3.4–5.3)
Sodium: 139 mmol/L (ref 137–147)

## 2014-05-22 LAB — CBC AND DIFFERENTIAL
HEMATOCRIT: 35 % — AB (ref 36–46)
Hemoglobin: 10.9 g/dL — AB (ref 12.0–16.0)
Platelets: 209 10*3/uL (ref 150–399)
WBC: 8.1 10^3/mL

## 2014-06-09 ENCOUNTER — Encounter: Payer: Self-pay | Admitting: Registered Nurse

## 2014-06-09 ENCOUNTER — Non-Acute Institutional Stay (SKILLED_NURSING_FACILITY): Payer: Medicare Other | Admitting: Registered Nurse

## 2014-06-09 DIAGNOSIS — N3000 Acute cystitis without hematuria: Secondary | ICD-10-CM

## 2014-06-09 NOTE — Progress Notes (Signed)
Patient ID: Kristin Frey, female   DOB: 05-01-1930, 80 y.o.   MRN: [id number]   Place of Service: [facility]  Allergies  Allergen Reactions  . Penicillins Anaphylaxis  . Fish Allergy Hives    Code Status: DNR  Goals of Care: Comfort and Quality of Life/Hospice  Chief Complaint  Patient presents with  . Acute Visit    uti    HPI  80 y.o. female hospice patient with PMH of PD, dementia, DM, HTN, depression, constipation is being seen for an acute visit for lab follow-up.  Per nursing staff, patient has increased confusion and hallucinations as well as multiple falls w/in the past 2 weeks. A UA with C&S was sent to check for possible UTI.  Her urine culture came back positive for Proteus mirabilis (>100,000 colonies). She was recently treated for a UTI with Klebsiella pneumoniae earlier this month.   Review of Systems Unable to obtain to due dementia  Past Medical History  Diagnosis Date  . Parkinson disease   . Carotid artery disease   . Hernia   . Retinopathy   . DVT (deep venous thrombosis) 1998  . High cholesterol   . Hypertension   . Hypothyroidism   . Atrial fibrillation   . Cancer 1998    left breast mastectomy chemo and radiation  . Cancer 2014    right breast wide excision   . Diabetes     Past Surgical History  Procedure Laterality Date  . Appendectomy  1949  . Carpal tunnel release Bilateral 1976  . Eye surgery  [phone]  . Cholecystectomy  2007  . Breast surgery Left 1998    mastectomy  . Breast surgery Right 2014    wide excision, sentinel node bx, mastoplasty,MammoSite    History   Social History  . Marital Status: Married    Spouse Name: N/A    Number of Children: 3  . Years of Education: 12   Occupational History  . N/A    Social History Main Topics  . Smoking status: Never Smoker   . Smokeless tobacco: Never Used  . Alcohol Use: No  . Drug Use: No  . Sexual Activity: No   Other Topics Concern  . Not on file    Social History Narrative   Patient lives at home alone.   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 Brother developed Parkinson's use to "chew coal" as a child.)   Caffeine Use: 2-3 cups caffeine daily   Family History  Problem Relation Age of Onset  . Heart attack Mother   . Heart disease Mother   . Heart disease Brother   . Parkinsonism Brother       Medication List       This list is accurate as of: 06/09/14  4:41 PM.  Always use your most recent med list.               calcium carbonate 500 MG chewable tablet  Commonly known as:  TUMS - dosed in mg elemental calcium  Chew 2 tablets by mouth every 6 (six) hours as needed for indigestion or heartburn.     carbidopa-levodopa 25-100 MG per tablet  Commonly known as:  SINEMET IR  Take 1 tablet by mouth 3 (three) times daily.     DULCOLAX 10 MG suppository  Generic drug:  bisacodyl  Place 10 mg rectally as needed for moderate constipation.  escitalopram 10 MG tablet  Commonly known as:  LEXAPRO  Take 20 mg by mouth daily.     morphine 20 MG/ML concentrated solution  Commonly known as:  ROXANOL  Take 5 mg by mouth every hour as needed for severe pain.     primidone 50 MG tablet  Commonly known as:  MYSOLINE  Take 50 mg by mouth 2 (two) times daily.     QUEtiapine 25 MG tablet  Commonly known as:  SEROQUEL  Take 25 mg by mouth at bedtime.     ranitidine 150 MG tablet  Commonly known as:  ZANTAC  Take 1 tablet (150 mg total) by mouth 2 (two) times daily.     sennosides-docusate sodium 8.6-50 MG tablet  Commonly known as:  SENOKOT-S  Take 2 tablets by mouth daily.        Physical Exam Filed Vitals:   06/09/14 1638  BP: 136/75  Pulse: 72  Temp: 97.8 F (36.6 C)  Resp: 19   Constitutional: WDWN elderly female in no acute distress.  HEENT: Normocephalic and atraumatic. PERRL. EOM intact. No icterus. Neck: Supple and nontender. No JVD or carotid  bruits. Cardiac: Normal S1, S2. RRR without appreciable murmurs, rubs, or gallops. Distal pulses intact. 1+pitting edema of BLE  Lungs: No respiratory distress. Breath sounds clear bilaterally without rales, rhonchi, or wheezes. Abdomen: Audible bowel sounds in all quadrants. Soft, nontender, nondistended.  Musculoskeletal: able to move all extremities Skin: Warm and dry. No rash noted. No erythema. Scattered small bruises note on BUE Neurological: Alert. Coarse hand tremors noted, worse with movement.  Psychiatric: flat affect  Labs Reviewed  CBC Latest Ref Rng 04/28/2014 12/18/2012 08/13/2009  WBC - 11.3 12.4(H) 7.6  Hemoglobin 12.0 - 16.0 g/dL 10.3(A) 12.4 12.4  Hematocrit 36 - 46 % 34(A) 38.5 37.6  Platelets 150 - 399 K/L 282 - 277     CMP Latest Ref Rng 04/28/2014 12/18/2012 08/13/2009  Glucose 65 - 99 mg/dL - 97 121(H)  BUN 4 - 21 mg/dL 25(A) 21 19  Creatinine 0.5 - 1.1 mg/dL 0.6 1.01(H) 0.7  Sodium 137 - 147 mmol/L 138 141 145  Potassium 3.4 - 5.3 mmol/L 4.2 4.5 4.1  Chloride 97 - 108 mmol/L - 100 104  CO2 18 - 29 mmol/L - 28 31  Calcium 8.6 - 10.2 mg/dL - 9.1 9.1  Total Protein 6.0 - 8.5 g/dL - 7.0 7.4  Albumin 3.5 - 4.7 g/dL - 3.6 3.1(L)  Total Bilirubin 0.0 - 1.2 mg/dL - 0.2 0.50  Alkaline Phos 39 - 117 IU/L - 81 58  AST 0 - 40 IU/L - 33 24  ALT 0 - 32 IU/L - 27 25    Assessment & Plan 1. Acute cystitis without hematuria Start Rocephin 1g IM daily x 5 days with florastor 250mg  twice daily x 2 weeks. Encourage fluid intake. Will continue to monitor her status  Family/Staff Communication Plan of care discussed with nursing staff. Nursing staff verbalized understanding and agree with plan of care. No additional questions or concerns reported.    Arthur Holms, MSN, AGNP-C [facility] [address] [place], [place] [address] [phone] [8am-5pm] After hours: [phone]

## 2014-06-23 ENCOUNTER — Encounter: Payer: Self-pay | Admitting: Registered Nurse

## 2014-06-23 ENCOUNTER — Non-Acute Institutional Stay (SKILLED_NURSING_FACILITY): Payer: Medicare Other | Admitting: Registered Nurse

## 2014-06-23 DIAGNOSIS — F0393 Unspecified dementia, unspecified severity, with mood disturbance: Secondary | ICD-10-CM

## 2014-06-23 DIAGNOSIS — G20 Parkinson's disease: Secondary | ICD-10-CM

## 2014-06-23 DIAGNOSIS — L89322 Pressure ulcer of left buttock, stage 2: Secondary | ICD-10-CM

## 2014-06-23 DIAGNOSIS — K219 Gastro-esophageal reflux disease without esophagitis: Secondary | ICD-10-CM

## 2014-06-23 DIAGNOSIS — R339 Retention of urine, unspecified: Secondary | ICD-10-CM

## 2014-06-23 DIAGNOSIS — G3183 Dementia with Lewy bodies: Secondary | ICD-10-CM

## 2014-06-23 DIAGNOSIS — F329 Major depressive disorder, single episode, unspecified: Secondary | ICD-10-CM

## 2014-06-23 DIAGNOSIS — F0280 Dementia in other diseases classified elsewhere without behavioral disturbance: Secondary | ICD-10-CM

## 2014-06-23 DIAGNOSIS — N179 Acute kidney failure, unspecified: Secondary | ICD-10-CM

## 2014-06-23 DIAGNOSIS — R296 Repeated falls: Secondary | ICD-10-CM

## 2014-06-23 DIAGNOSIS — K5900 Constipation, unspecified: Secondary | ICD-10-CM

## 2014-06-23 DIAGNOSIS — R251 Tremor, unspecified: Secondary | ICD-10-CM

## 2014-06-23 DIAGNOSIS — R634 Abnormal weight loss: Secondary | ICD-10-CM

## 2014-06-23 NOTE — Progress Notes (Addendum)
Patient ID: Kristin Frey, female   DOB: August 25, 1929, 81 y.o.   MRN: [id number]   Place of Service: [facility]  Allergies  Allergen Reactions  . Penicillins Anaphylaxis  . Fish Allergy Hives    Code Status: DNR  Goals of Care: Comfort and Quality of Life/Hospice  Chief Complaint  Patient presents with  . Medical Management of Chronic Issues    PD with Lewy bodies, depression, GERD, constipation, UR, frequent falls     HPI 81 y.o. female hospice patient with PMH of PD, dementia, DM, HTN, depression, constipation, urinary rentention with foley cath among others is being seen for a routine visit for management of her chronic issues. Has 11 lbs weight loss since last monthly routine visit-has not eating or drinking much over the past few weeks. She has had a few falls and also developed a stage 2 pressure ulcer 3x0.5cm of left buttock. Per nursing staff, patient has been refusing her medications-family and hospice are aware. She had taken her medications this morning and has finished her abx for recent UTI. Seen in room today. Unable to participate in HPI and ROS.   Review of Systems Unable to obtain due to dementia but appears in no acute distress.   Past Medical History  Diagnosis Date  . Parkinson disease   . Carotid artery disease   . Hernia   . Retinopathy   . DVT (deep venous thrombosis) 1998  . High cholesterol   . Hypertension   . Hypothyroidism   . Atrial fibrillation   . Cancer 1998    left breast mastectomy chemo and radiation  . Cancer 2014    right breast wide excision   . Diabetes     Past Surgical History  Procedure Laterality Date  . Appendectomy  1949  . Carpal tunnel release Bilateral 1976  . Eye surgery  [phone]  . Cholecystectomy  2007  . Breast surgery Left 1998    mastectomy  . Breast surgery Right 2014    wide excision, sentinel node bx, mastoplasty,MammoSite    History   Social History  . Marital Status: Married    Spouse  Name: N/A    Number of Children: 3  . Years of Education: 12   Occupational History  . N/A    Social History Main Topics  . Smoking status: Never Smoker   . Smokeless tobacco: Never Used  . Alcohol Use: No  . Drug Use: No  . Sexual Activity: No   Other Topics Concern  . Not on file   Social History Narrative   Patient lives at home alone.   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 Brother developed Parkinson's use to "chew coal" as a child.)   Caffeine Use: 2-3 cups caffeine daily    Family History  Problem Relation Age of Onset  . Heart attack Mother   . Heart disease Mother   . Heart disease Brother   . Parkinsonism Brother       Medication List       This list is accurate as of: 06/23/14 11:59 PM.  Always use your most recent med list.               calcium carbonate 500 MG chewable tablet  Commonly known as:  TUMS - dosed in mg elemental calcium  Chew 2 tablets by mouth every 6 (six) hours as needed for indigestion or heartburn.  carbidopa-levodopa 25-100 MG per tablet  Commonly known as:  SINEMET IR  Take 1 tablet by mouth 3 (three) times daily.     DULCOLAX 10 MG suppository  Generic drug:  bisacodyl  Place 10 mg rectally as needed for moderate constipation.     escitalopram 10 MG tablet  Commonly known as:  LEXAPRO  Take 10 mg by mouth daily.     morphine 20 MG/ML concentrated solution  Commonly known as:  ROXANOL  Take 5 mg by mouth every hour as needed for severe pain.     primidone 50 MG tablet  Commonly known as:  MYSOLINE  Take 50 mg by mouth 2 (two) times daily.     QUEtiapine 25 MG tablet  Commonly known as:  SEROQUEL  Take 25-50 mg by mouth. 25mg  qam and 50mg  QHS     ranitidine 150 MG tablet  Commonly known as:  ZANTAC  Take 1 tablet (150 mg total) by mouth 2 (two) times daily.     sennosides-docusate sodium 8.6-50 MG tablet  Commonly known as:  SENOKOT-S  Take 2 tablets by mouth  daily.        Physical Exam  BP 131/80 mmHg  Pulse 84  Temp(Src) 97.9 F (36.6 C)  Resp 18  Ht 5\' 3"  (1.6 m)  Wt 140 lb (63.504 kg)  BMI 24.81 kg/m2  SpO2 98%  Constitutional: WDWN elderly female in no acute distress.  HEENT: Normocephalic and atraumatic. PERRL. EOM intact. No icterus.  Neck: Supple and nontender. No lymphadenopathy, masses, or thyromegaly. No JVD or carotid bruits. Cardiac: Normal S1, S2. RRR without appreciable murmurs, rubs, or gallops. Distal pulses intact. Trace pitting edema of BLE Lungs: No respiratory distress. Breath sounds clear bilaterally without rales, rhonchi, or wheezes. Abdomen: Audible bowel sounds in all quadrants. Soft, nontender, nondistended.  Musculoskeletal: able to move all extremities. No joint erythema or tenderness. GU: Foley cath in place Skin: Warm and dry. No rash noted. Scattered bruises on BUE. Stage 2 pressure 3x0.5cm of left buttock.  Neurological: Alert. Hand tremors noted.  Psychiatric: withdrawn, lack of eye contact  Labs Reviewed  CBC Latest Ref Rng 05/22/2014 04/28/2014 12/18/2012  WBC - 8.1 11.3 12.4(H)  Hemoglobin 12.0 - 16.0 g/dL 10.9(A) 10.3(A) 12.4  Hematocrit 36 - 46 % 35(A) 34(A) 38.5  Platelets 150 - 399 K/L 209 282 -    CMP Latest Ref Rng 05/22/2014 04/28/2014 12/18/2012  Glucose 65 - 99 mg/dL - - 97  BUN 4 - 21 mg/dL 21 25(A) 21  Creatinine 0.5 - 1.1 mg/dL 1.6(A) 0.6 1.01(H)  Sodium 137 - 147 mmol/L 139 138 141  Potassium 3.4 - 5.3 mmol/L 3.6 4.2 4.5  Chloride 97 - 108 mmol/L - - 100  CO2 18 - 29 mmol/L - - 28  Calcium 8.6 - 10.2 mg/dL - - 9.1  Total Protein 6.0 - 8.5 g/dL - - 7.0  Albumin 3.5 - 4.7 g/dL - - 3.6  Total Bilirubin 0.0 - 1.2 mg/dL - - 0.2  Alkaline Phos 39 - 117 IU/L - - 81  AST 0 - 40 IU/L - - 33  ALT 0 - 32 IU/L - - 27    Assessment & Plan 1. Parkinson's disease, Lewy body Regressing with further decline anticipated. Continue sinemet 25/100mg  three times daily and monitor. Continue  seroquel 25mg  daily in the morning and 50mg  daily at bedtime for agitation and hallucination.   2. Depression due to dementia Stable. Lexapro was recently decreased to 10mg  daily  by psy. Continue to monitor for now.   3. Tremor Stable. Continue primidone 50mg  daily and monitor.  4. Gastroesophageal reflux disease, esophagitis presence not specified Stable. Continue zantac 150mg  daily and monitor  5. Constipation, unspecified constipation type Stable. Continue senna s 2 tabs daily and dulcolax 10mg  suppository PRN moderate constipation. Continue to monitor.   6. Urinary retention Has foley catheter in place.  7. Frequent falls Mostly likely secondary to increased confusions, paranoid, and hallucinations. Psy recommended decreasing lexapro from 20mg  to 10mg  daily, seroquel 25mg  daily every morning and 50mg  daily at bedtime. Continue fall risk precautions.   8. Weight loss Has lost 11 lbs since last routine visit. Decline anticipated. Her PO intake varies, but has been very poor over the past few weeks. Continue Puree diet with honey thicken liquids and magic cup twice daily. Continue to montior  9. Stage 2 PU of left buttock New wound. Flexicol wound dressing initiated with gel matress, positioning pillows, and frequent offload. Continue pressure ulcer precautions.   10. AKI Mostly likely secondary to poor PO intake. Repeat bmp. Will initiate IV hydration as necessary. Encourage fluid intake. Continue to monitor closely   06/26/14: BUN and Cr returned to baseline.   Family/Staff Communication Plan of care discussed with nursing supervisor. Nursing supervisor verbalized understanding and agree with plan of care. No additional questions or concerns reported.    Arthur Holms, MSN, AGNP-C [facility] [address] [place], [place] [address] [phone] [8am-5pm] After hours: [phone]

## 2014-06-25 LAB — BASIC METABOLIC PANEL
BUN: 16 mg/dL (ref 4–21)
CREATININE: 0.9 mg/dL (ref 0.5–1.1)
Glucose: 82 mg/dL
Potassium: 4.5 mmol/L (ref 3.4–5.3)
Sodium: 136 mmol/L — AB (ref 137–147)

## 2014-07-07 DIAGNOSIS — F0281 Dementia in other diseases classified elsewhere with behavioral disturbance: Secondary | ICD-10-CM | POA: Diagnosis not present

## 2014-07-07 DIAGNOSIS — G20 Parkinson's disease: Secondary | ICD-10-CM

## 2014-07-07 DIAGNOSIS — F22 Delusional disorders: Secondary | ICD-10-CM | POA: Diagnosis not present

## 2014-07-07 DIAGNOSIS — G3183 Dementia with Lewy bodies: Secondary | ICD-10-CM | POA: Diagnosis not present

## 2014-07-21 ENCOUNTER — Non-Acute Institutional Stay (SKILLED_NURSING_FACILITY): Payer: Medicare Other | Admitting: Registered Nurse

## 2014-07-21 DIAGNOSIS — F329 Major depressive disorder, single episode, unspecified: Secondary | ICD-10-CM

## 2014-07-21 DIAGNOSIS — G20 Parkinson's disease: Secondary | ICD-10-CM

## 2014-07-21 DIAGNOSIS — G3183 Dementia with Lewy bodies: Secondary | ICD-10-CM

## 2014-07-21 DIAGNOSIS — F0393 Unspecified dementia, unspecified severity, with mood disturbance: Secondary | ICD-10-CM

## 2014-07-21 DIAGNOSIS — K5900 Constipation, unspecified: Secondary | ICD-10-CM

## 2014-07-21 DIAGNOSIS — F0280 Dementia in other diseases classified elsewhere without behavioral disturbance: Secondary | ICD-10-CM

## 2014-07-21 DIAGNOSIS — E46 Unspecified protein-calorie malnutrition: Secondary | ICD-10-CM

## 2014-07-21 DIAGNOSIS — L89322 Pressure ulcer of left buttock, stage 2: Secondary | ICD-10-CM

## 2014-07-21 DIAGNOSIS — K219 Gastro-esophageal reflux disease without esophagitis: Secondary | ICD-10-CM

## 2014-07-21 DIAGNOSIS — R339 Retention of urine, unspecified: Secondary | ICD-10-CM

## 2014-07-22 ENCOUNTER — Encounter: Payer: Self-pay | Admitting: Registered Nurse

## 2014-07-22 NOTE — Progress Notes (Signed)
Patient ID: Kristin Frey, female   DOB: 1930-03-11, 81 y.o.   MRN: [id number]   Place of Service: [facility]  Allergies  Allergen Reactions  . Penicillins Anaphylaxis  . Fish Allergy Hives    Code Status: DNR  Goals of Care: Comfort and Quality of Life/Hospice  Chief Complaint  Patient presents with  . Medical Management of Chronic Issues    PD with Lewy bodies, stage 2 ulcer of left buttock, UR, constipation, GERD    HPI 81 y.o. female Hospice patient with PMH of PD, dementia, DM, HTN, depression, constipation, urinary rentention with foley cath among others is being seen for a routine visit for management of her chronic issues. Weight stable since last routine visit. No recent falls. No change in behaviors for functional status reported. No concerns from staff. Still has Foley cath for chronic urinary retention. PD dementia with Lewy bodies advanced-with further declined anticipated. GERD stable on H2 blocker. Stage 2 pressure ulcer declining, measuring at 3x3x0.2cm, irregular shape, semi-dry with 100% smooth wound bed. No issues with constipation. Seen in room today. Unable to participate in HPI and ROS.   Review of Systems Unable to obtain due to dementia but appears in no acute distress.   Past Medical History  Diagnosis Date  . Parkinson disease   . Carotid artery disease   . Hernia   . Retinopathy   . DVT (deep venous thrombosis) 1998  . High cholesterol   . Hypertension   . Hypothyroidism   . Atrial fibrillation   . Cancer 1998    left breast mastectomy chemo and radiation  . Cancer 2014    right breast wide excision   . Diabetes     Past Surgical History  Procedure Laterality Date  . Appendectomy  1949  . Carpal tunnel release Bilateral 1976  . Eye surgery  [phone]  . Cholecystectomy  2007  . Breast surgery Left 1998    mastectomy  . Breast surgery Right 2014    wide excision, sentinel node bx, mastoplasty,MammoSite    History   Social  History  . Marital Status: Married    Spouse Name: N/A    Number of Children: 3  . Years of Education: 12   Occupational History  . N/A    Social History Main Topics  . Smoking status: Never Smoker   . Smokeless tobacco: Never Used  . Alcohol Use: No  . Drug Use: No  . Sexual Activity: No   Other Topics Concern  . Not on file   Social History Narrative   Patient lives at home alone.   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 Brother developed Parkinson's use to "chew coal" as a child.)   Caffeine Use: 2-3 cups caffeine daily    Family History  Problem Relation Age of Onset  . Heart attack Mother   . Heart disease Mother   . Heart disease Brother   . Parkinsonism Brother       Medication List       This list is accurate as of: 07/21/14 11:59 PM.  Always use your most recent med list.               acetaminophen 325 MG tablet  Commonly known as:  TYLENOL  Take 650 mg by mouth every 6 (six) hours as needed.     calcium carbonate 500 MG chewable tablet  Commonly known as:  TUMS -  dosed in mg elemental calcium  Chew 2 tablets by mouth every 6 (six) hours as needed for indigestion or heartburn.     carbidopa-levodopa 25-100 MG per tablet  Commonly known as:  SINEMET IR  Take 1 tablet by mouth 3 (three) times daily.     DULCOLAX 10 MG suppository  Generic drug:  bisacodyl  Place 10 mg rectally as needed for moderate constipation.     escitalopram 10 MG tablet  Commonly known as:  LEXAPRO  Take 10 mg by mouth daily.     LORazepam 0.5 MG tablet  Commonly known as:  ATIVAN  Take 0.25 mg by mouth 2 (two) times daily as needed for anxiety.     morphine 20 MG/ML concentrated solution  Commonly known as:  ROXANOL  Take 5 mg by mouth every hour as needed for severe pain.     polyvinyl alcohol-povidone 1.4-0.6 % ophthalmic solution  Commonly known as:  HYPOTEARS  Place 1-2 drops into both eyes every 4 (four) hours  as needed.     primidone 50 MG tablet  Commonly known as:  MYSOLINE  Take 50 mg by mouth 2 (two) times daily.     QUEtiapine 25 MG tablet  Commonly known as:  SEROQUEL  Take 25-50 mg by mouth. 25mg  qam and 50mg  QHS     ranitidine 150 MG tablet  Commonly known as:  ZANTAC  Take 1 tablet (150 mg total) by mouth 2 (two) times daily.     sennosides-docusate sodium 8.6-50 MG tablet  Commonly known as:  SENOKOT-S  Take 2 tablets by mouth daily.        Physical Exam  BP 130/72 mmHg  Pulse 74  Temp(Src) 97.8 F (36.6 C)  Resp 19  Ht 5\' 3"  (1.6 m)  Wt 142 lb 9.6 oz (64.683 kg)  BMI 25.27 kg/m2  SpO2 97%  Constitutional: WDWN elderly female in no acute distress.  HEENT: Normocephalic and atraumatic. PERRL. EOM intact. No icterus.  Neck: Supple and nontender. No lymphadenopathy, masses, or thyromegaly. No JVD or carotid bruits. Cardiac: Normal S1, S2. RRR without appreciable murmurs, rubs, or gallops. Distal pulses intact. Trace pitting edema of BLE Lungs: No respiratory distress. Breath sounds clear bilaterally without rales, rhonchi, or wheezes. Abdomen: Audible bowel sounds in all quadrants. Soft, nontender, nondistended.  Musculoskeletal: able to move all extremities. No joint erythema or tenderness. GU: Foley cath in place Skin: Warm and dry. No rash noted. Scattered bruises on BUE. Stage 2 pressure 3x3x0.5cm of left buttock (see HPI) Neurological: Alert. Hand tremors noted.  Psychiatric: normal mood and affect.   Labs Reviewed  CBC Latest Ref Rng 05/22/2014 04/28/2014 12/18/2012  WBC - 8.1 11.3 12.4(H)  Hemoglobin 12.0 - 16.0 g/dL 10.9(A) 10.3(A) 12.4  Hematocrit 36 - 46 % 35(A) 34(A) 38.5  Platelets 150 - 399 K/L 209 282 -    CMP Latest Ref Rng 06/25/2014 05/22/2014 04/28/2014  Glucose 65 - 99 mg/dL - - -  BUN 4 - 21 mg/dL 16 21 25(A)  Creatinine 0.5 - 1.1 mg/dL 0.9 1.6(A) 0.6  Sodium 137 - 147 mmol/L 136(A) 139 138  Potassium 3.4 - 5.3 mmol/L 4.5 3.6 4.2    Chloride 97 - 108 mmol/L - - -  CO2 18 - 29 mmol/L - - -  Calcium 8.6 - 10.2 mg/dL - - -  Total Protein 6.0 - 8.5 g/dL - - -  Albumin 3.5 - 4.7 g/dL - - -  Total Bilirubin 0.0 - 1.2 mg/dL - - -  Alkaline Phos 39 - 117 IU/L - - -  AST 0 - 40 IU/L - - -  ALT 0 - 32 IU/L - - -    Assessment & Plan 1. Parkinson's disease, Lewy body Advanced wiht further decline anticipated. Continue sinemet 25/100mg  three times daily. continue primidone 50mg  daily for tremors and seroquel 25mg  daily in the morning and 50mg  daily at bedtime for agitation and hallucination. Continue total assist with ADL care. Fall risk and pressure ulcer precautions  2. Depression due to dementia Stable. Continue lexapro 10mg  daily and monitor for change in mood  3. Gastroesophageal reflux disease, esophagitis presence not specified Stable. Continue zantac 150mg  daily   4. Constipation, unspecified constipation type No issues. Continue senna s 2 tabs daily and dulcolax 10mg  suppository as needed for moderate constipation. Encouge hydration.   5. Urinary retention Stable. Continue Foley catheter and record output every shift in MAR.   6. Protein-calorie malnutrition Weight stable since last routine visit. Decline anticipated due to demenitia. Continue Puree diet with honey thicken liquids and magic cup twice daily.   7. Stage 2 PU of left buttock Declining. Continue wound care with NS, silver alginate, and foam dressing every 3 days. Continue to monitor her status.    Family/Staff Communication Plan of care discussed with nursing staff. Nursing staff verbalized understanding and agree with plan of care. No additional questions or concerns reported.    Arthur Holms, MSN, AGNP-C [facility] [address] [place], [place] [address] [phone] [8am-5pm] After hours: [phone]

## 2014-08-10 IMAGING — CR DG CHEST 2V
1 series · 3 of 3 positions shown · non-contrast
Comparison: none

REASON FOR EXAM: Cough
COMMENTS:

PROCEDURE:     DXR - DXR CHEST PA (OR AP) AND LATERAL  - June 30, 2012  [DATE]
RESULT:     Comparison: Right rib series 09/04/2010

[Series 1: pa · 0.17mm/px · 3 of 3 slices shown]
[im 1/3]
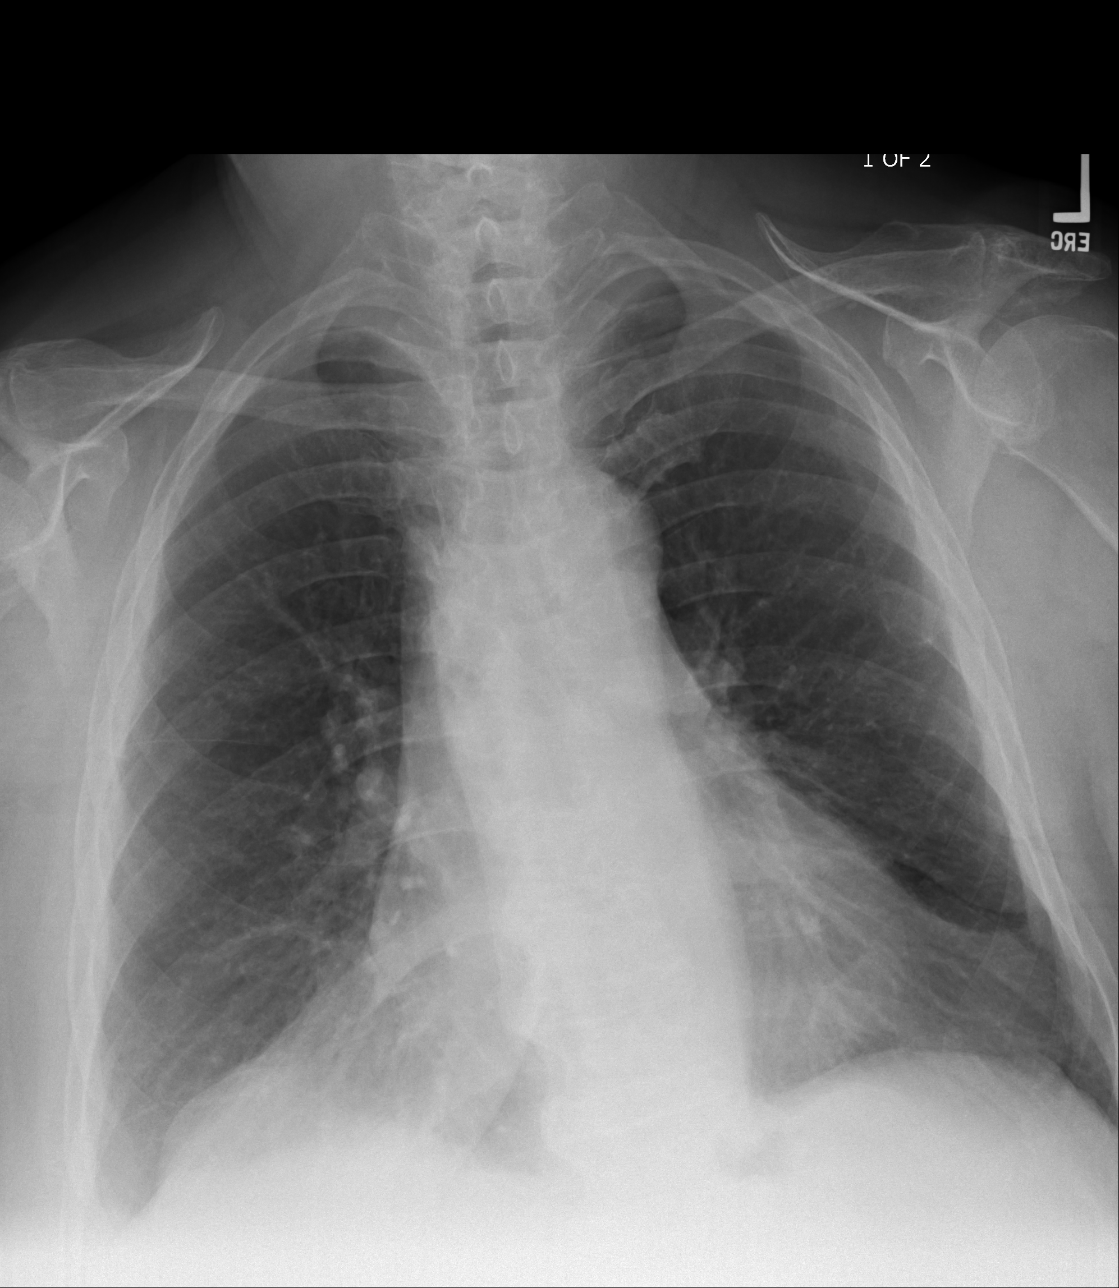
[im 2/3]
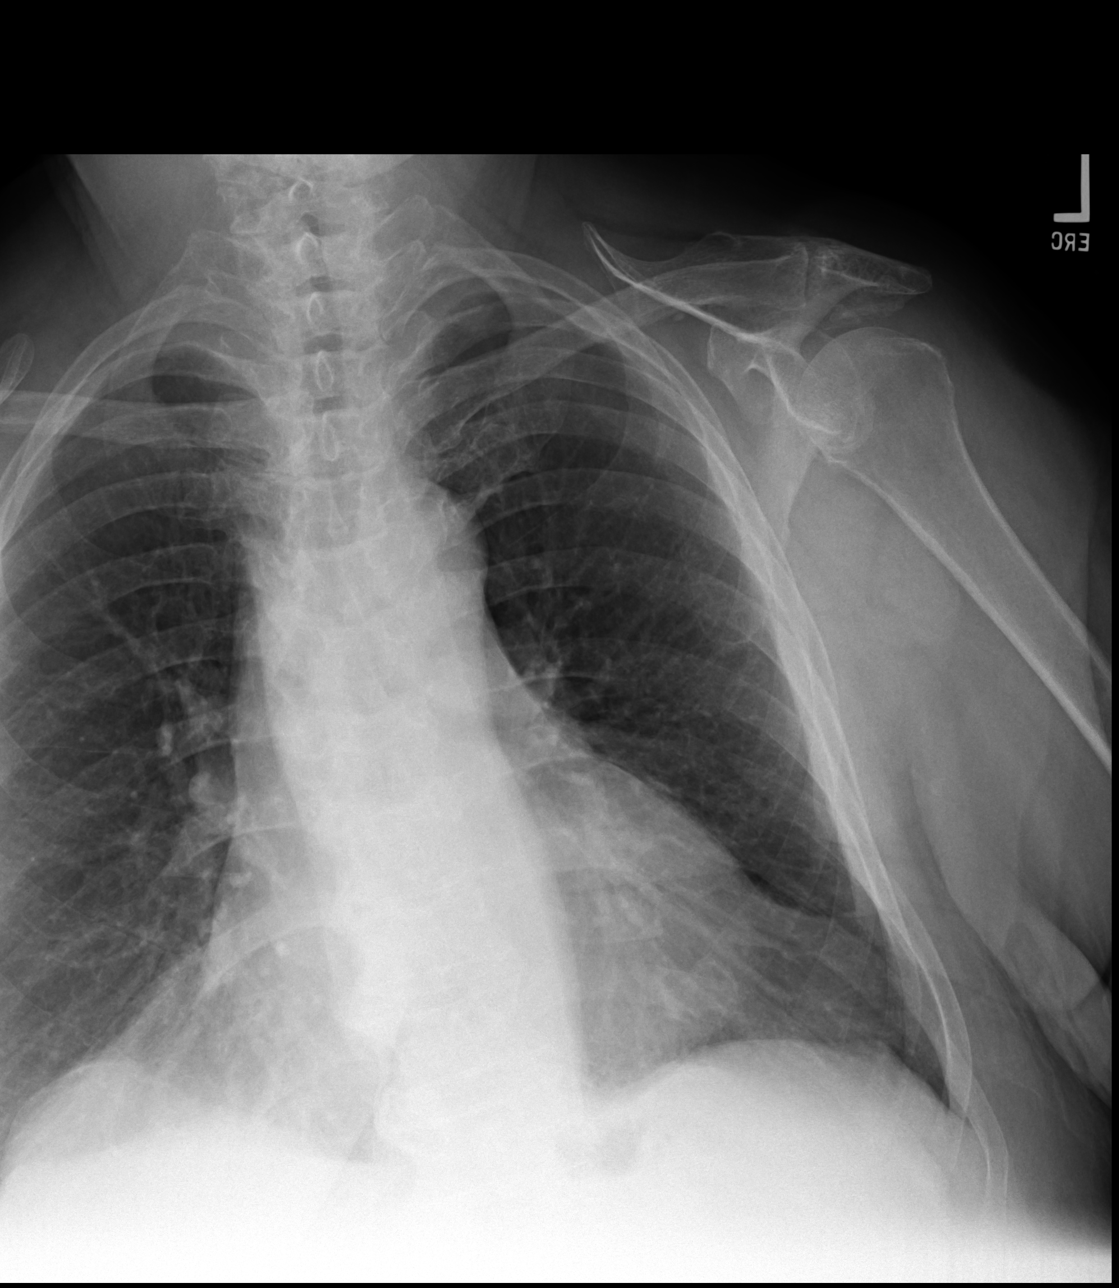
[im 3/3]
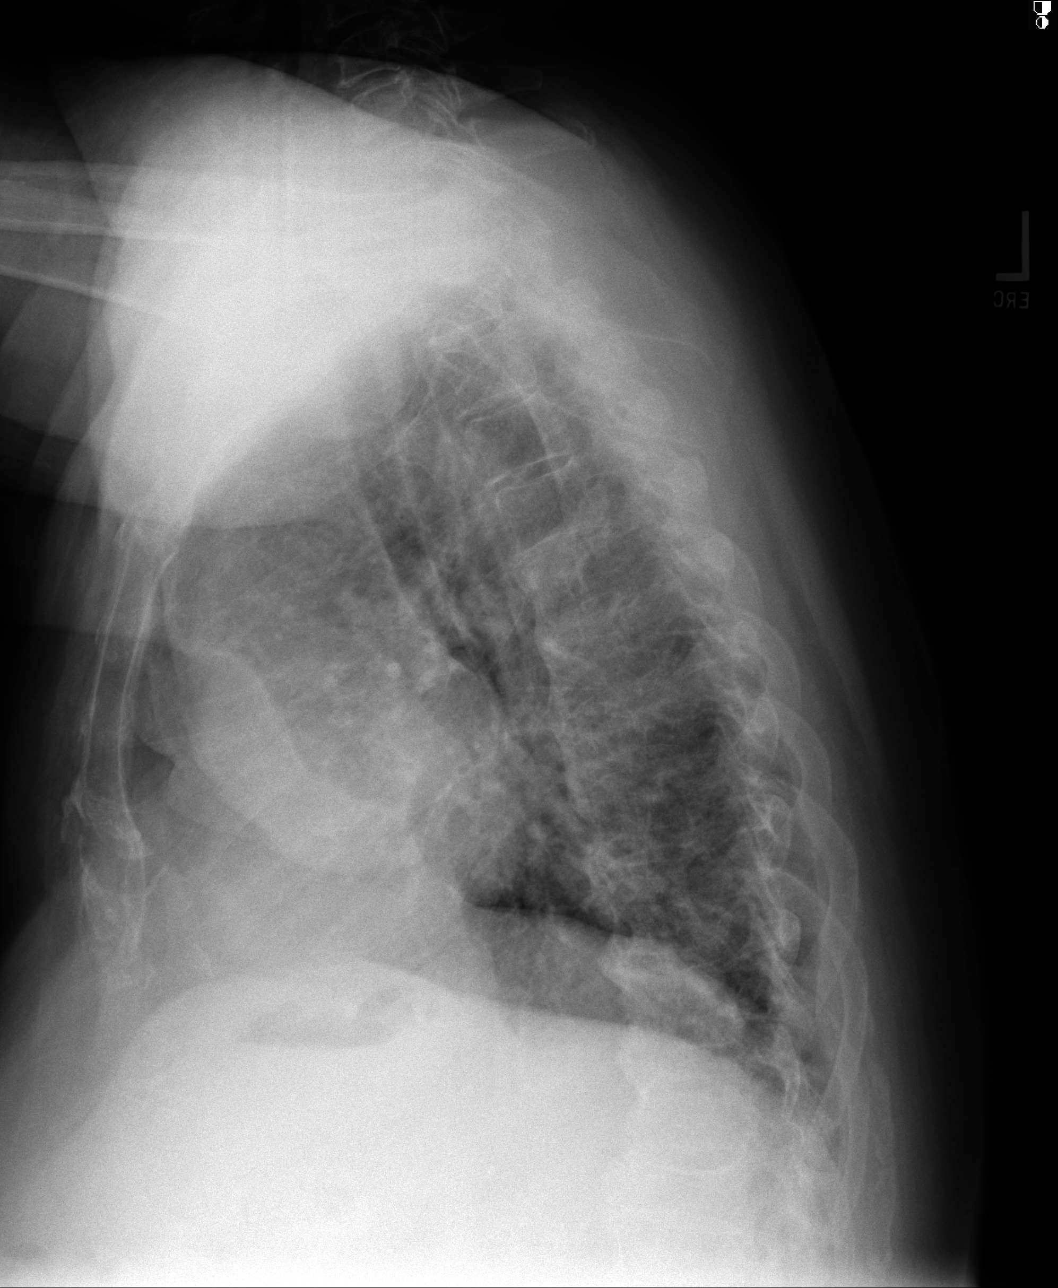

[3 of 3 positions shown; findings below may reference images not displayed]

FINDINGS: Cardiomegaly and mediastinum are similar to prior. No focal pulmonary
opacities.
IMPRESSION: No acute cardiopulmonary disease.

## 2014-08-11 IMAGING — US US EXTREM LOW VENOUS*R*
1 series · 14 of 16 positions shown · non-contrast
Comparison: none

REASON FOR EXAM: swelling and pain w/ hx dvt and low inr
COMMENTS:

[Series 1: us extrem low venous*right* · 0.11mm/px · 14 of 16 slices shown]
[im 1/16]
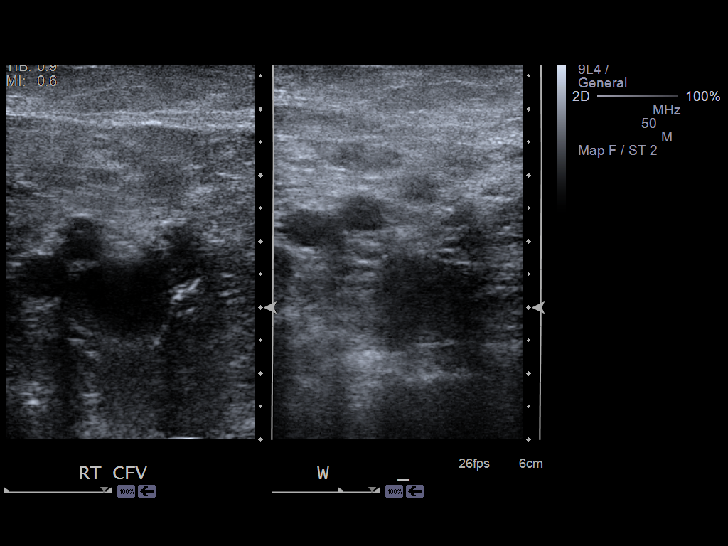
[im 2/16]
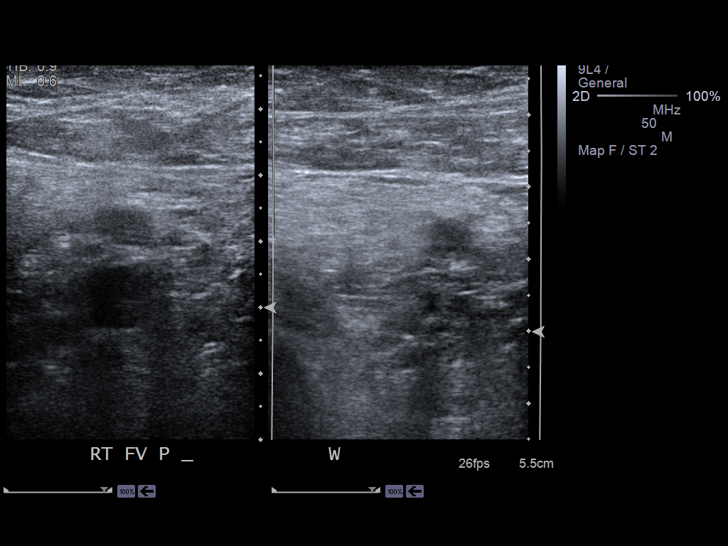
[im 3/16]
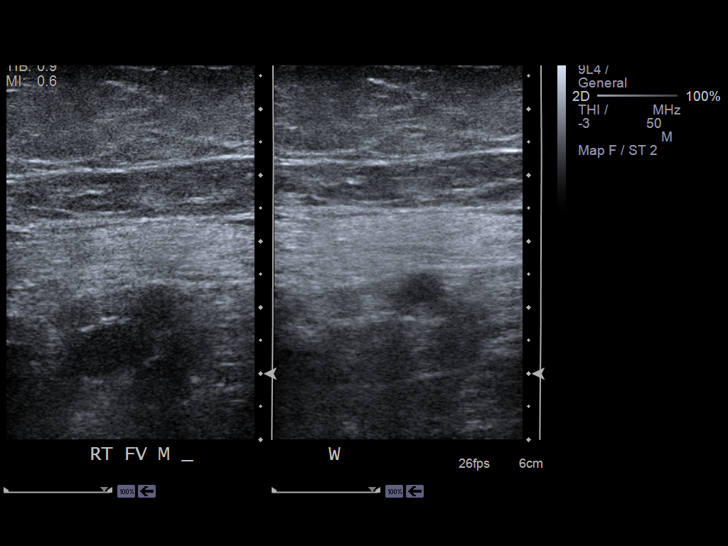
[im 5/16]
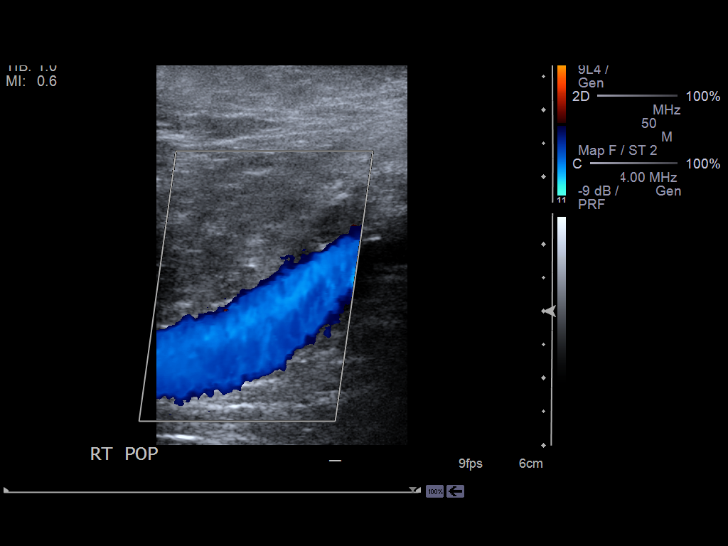
[im 6/16]
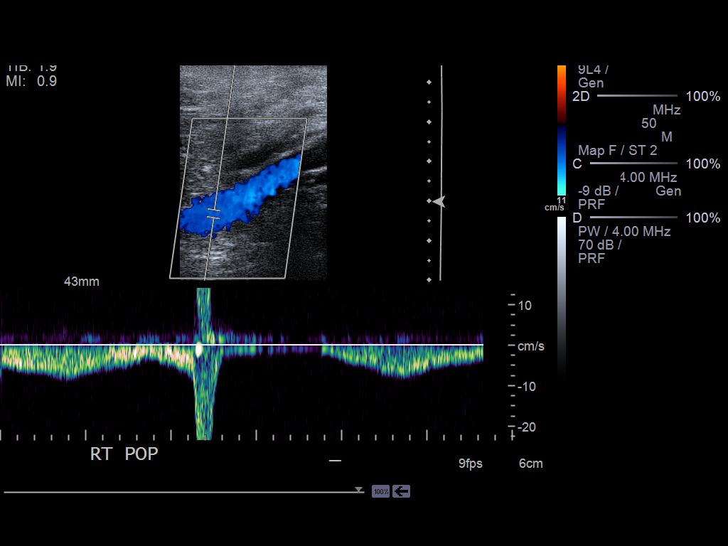
[im 7/16]
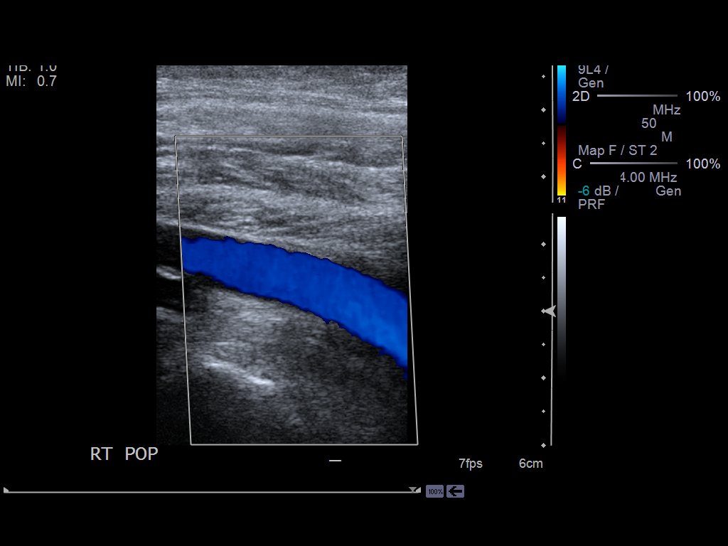
[im 8/16]
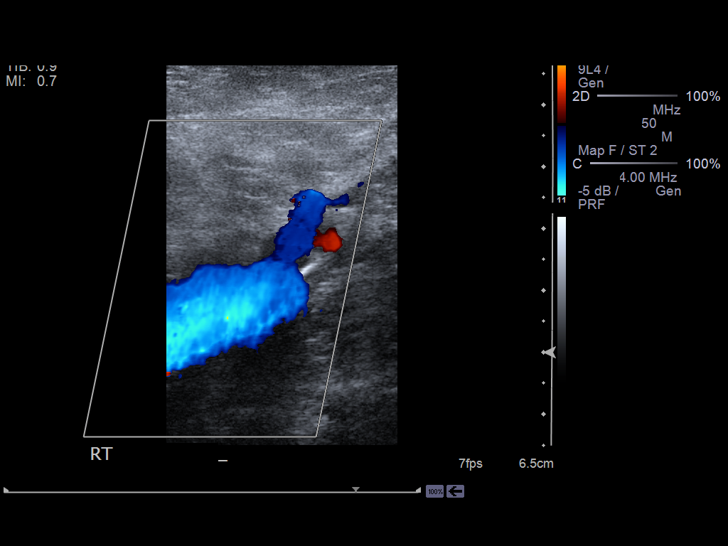
[im 9/16]
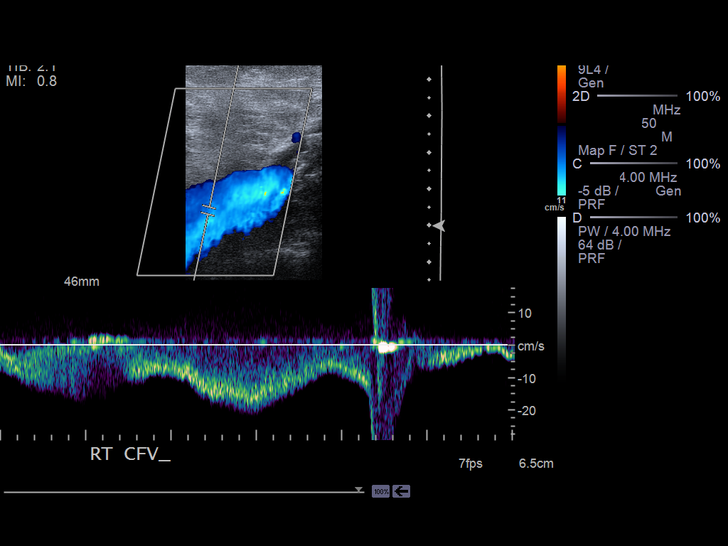
[im 10/16]
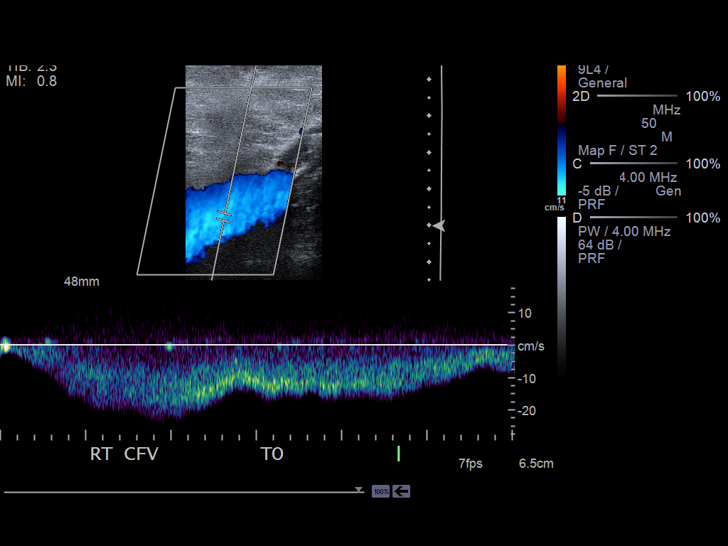
[im 11/16]
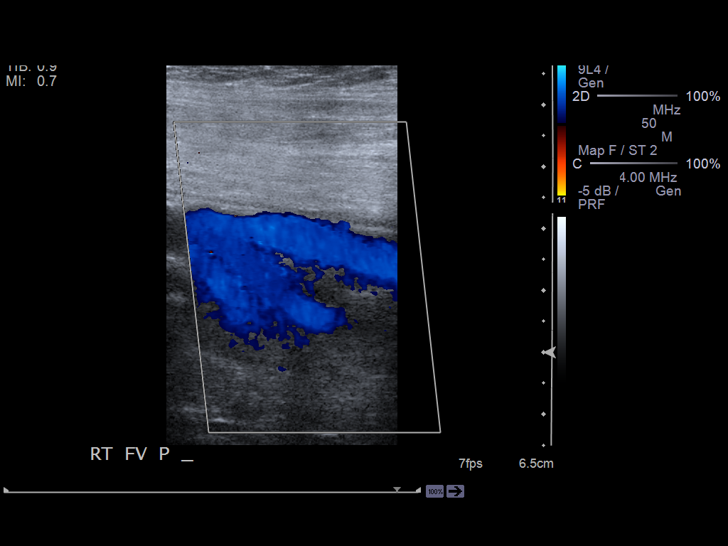
[im 13/16]
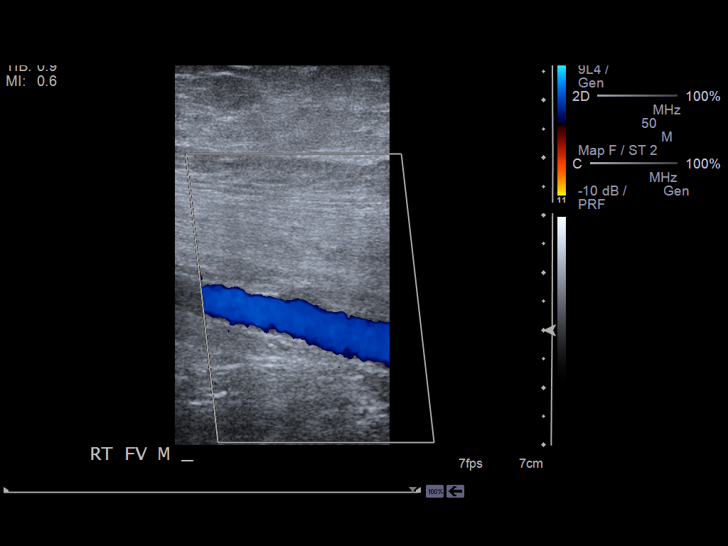
[im 14/16]
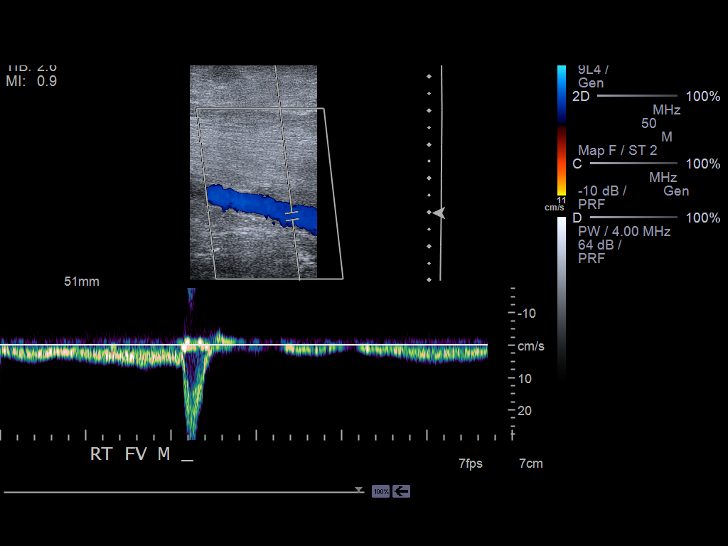
[im 15/16]
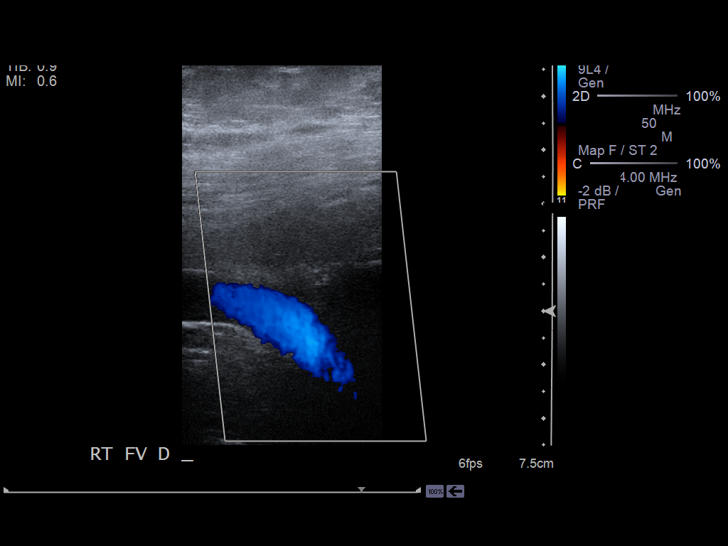
[im 16/16]
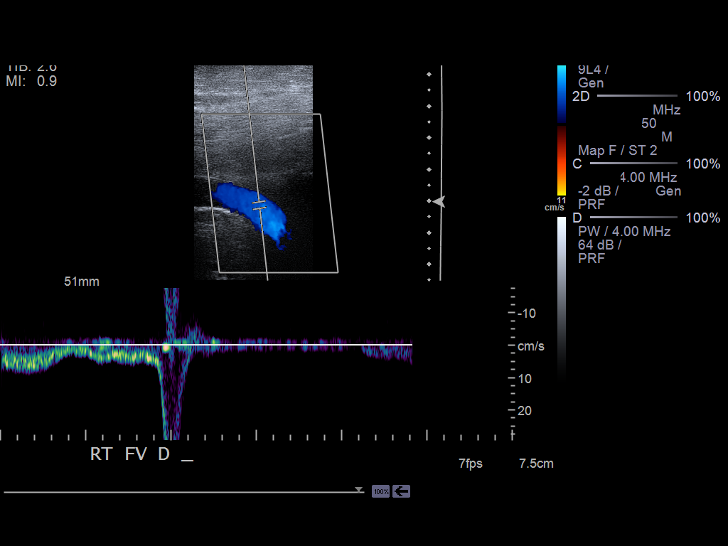

[14 of 16 positions shown; findings below may reference images not displayed]

PROCEDURE:     US  - US DOPPLER LOW EXTR RIGHT  - July 01, 2012  [DATE]

RESULT:     Comparison: None

Technique and findings: Multiple longitudinal and transverse grayscale as
well as color and spectral Doppler images of the right lower extremity veins
were obtained from the common femoral veins through the popliteal veins.

The right common femoral, femoral, and popliteal veins are patent,
demonstrating normal color-flow and compressibility. No intraluminal
thrombus is identified.  There is normal respiratory variation and
augmentation demonstrated at all vein levels.
IMPRESSION: No evidence of DVT in the right lower extremity.

## 2014-08-19 ENCOUNTER — Non-Acute Institutional Stay (SKILLED_NURSING_FACILITY): Payer: Medicare Other | Admitting: Registered Nurse

## 2014-08-19 DIAGNOSIS — R339 Retention of urine, unspecified: Secondary | ICD-10-CM

## 2014-08-19 DIAGNOSIS — K5900 Constipation, unspecified: Secondary | ICD-10-CM

## 2014-08-19 DIAGNOSIS — G20 Parkinson's disease: Secondary | ICD-10-CM

## 2014-08-19 DIAGNOSIS — F0280 Dementia in other diseases classified elsewhere without behavioral disturbance: Secondary | ICD-10-CM | POA: Diagnosis not present

## 2014-08-19 DIAGNOSIS — F329 Major depressive disorder, single episode, unspecified: Secondary | ICD-10-CM

## 2014-08-19 DIAGNOSIS — E46 Unspecified protein-calorie malnutrition: Secondary | ICD-10-CM

## 2014-08-19 DIAGNOSIS — L89322 Pressure ulcer of left buttock, stage 2: Secondary | ICD-10-CM

## 2014-08-19 DIAGNOSIS — K219 Gastro-esophageal reflux disease without esophagitis: Secondary | ICD-10-CM | POA: Diagnosis not present

## 2014-08-19 DIAGNOSIS — F0393 Unspecified dementia, unspecified severity, with mood disturbance: Secondary | ICD-10-CM

## 2014-08-19 DIAGNOSIS — G3183 Dementia with Lewy bodies: Secondary | ICD-10-CM

## 2014-08-19 NOTE — Progress Notes (Signed)
Patient ID: Kristin Frey, female   DOB: 01/14/1930, 81 y.o.   MRN: [id number]   Place of Service: [facility]  Allergies  Allergen Reactions  . Penicillins Anaphylaxis  . Fish Allergy Hives    Code Status: DNR  Goals of Care: Comfort and Quality of Life/Hospice  Chief Complaint  Patient presents with  . Medical Management of Chronic Issues    Dementia, PU stage 2, depression, GERD, constipation, UR, protein calorie malnutrition     HPI 81 y.o. female Hospice patient with PMH of PD, dementia, DM, HTN, depression, constipation, urinary rentention with foley cath among others is being seen for a routine visit for management of her chronic issues. Has 2lbs weight gain since last routine visit,. No recent falls. No change in behaviors for functional status reported. No concerns from staff. Foley cath remains in place for chronic urinary retention. PD dementia with Lewy bodies advanced. GERD stable on H2 blocker. Stage 2 pressure ulcer improving: measuring smaller 0.8x0.5x0.2cm, circular shaped, semi-dry with 100% smooth wound bed and scant amount of serosanguineous drainage. Wound edges flat and smooth with improved maceration. No issues with constipation. Seen in room today. Unable to participate in HPI and ROS.   Review of Systems Unable to obtain due to dementia but appears in no acute distress.   Past Medical History  Diagnosis Date  . Parkinson disease   . Carotid artery disease   . Hernia   . Retinopathy   . DVT (deep venous thrombosis) 1998  . High cholesterol   . Hypertension   . Hypothyroidism   . Atrial fibrillation   . Cancer 1998    left breast mastectomy chemo and radiation  . Cancer 2014    right breast wide excision   . Diabetes     Past Surgical History  Procedure Laterality Date  . Appendectomy  1949  . Carpal tunnel release Bilateral 1976  . Eye surgery  [phone]  . Cholecystectomy  2007  . Breast surgery Left 1998    mastectomy  . Breast  surgery Right 2014    wide excision, sentinel node bx, mastoplasty,MammoSite    History   Social History  . Marital Status: Married    Spouse Name: N/A  . Number of Children: 3  . Years of Education: 12   Occupational History  . N/A    Social History Main Topics  . Smoking status: Never Smoker   . Smokeless tobacco: Never Used  . Alcohol Use: No  . Drug Use: No  . Sexual Activity: No   Other Topics Concern  . Not on file   Social History Narrative   Patient lives at home alone.   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 Brother developed Parkinson's use to "chew coal" as a child.)   Caffeine Use: 2-3 cups caffeine daily    Family History  Problem Relation Age of Onset  . Heart attack Mother   . Heart disease Mother   . Heart disease Brother   . Parkinsonism Brother       Medication List       This list is accurate as of: 08/19/14  9:13 PM.  Always use your most recent med list.               acetaminophen 325 MG tablet  Commonly known as:  TYLENOL  Take 650 mg by mouth every 6 (six) hours as needed.  carbidopa-levodopa 25-100 MG per tablet  Commonly known as:  SINEMET IR  Take 1 tablet by mouth 3 (three) times daily.     escitalopram 10 MG tablet  Commonly known as:  LEXAPRO  Take 10 mg by mouth daily.     morphine 20 MG/ML concentrated solution  Commonly known as:  ROXANOL  Take 5 mg by mouth every hour as needed for severe pain.     primidone 50 MG tablet  Commonly known as:  MYSOLINE  Take 50 mg by mouth 2 (two) times daily.     QUEtiapine 25 MG tablet  Commonly known as:  SEROQUEL  Take 25-50 mg by mouth. 25mg  qam and 50mg  QHS     ranitidine 150 MG tablet  Commonly known as:  ZANTAC  Take 1 tablet (150 mg total) by mouth 2 (two) times daily.     sennosides-docusate sodium 8.6-50 MG tablet  Commonly known as:  SENOKOT-S  Take 2 tablets by mouth daily.        Physical Exam  BP  132/74 mmHg  Pulse 70  Temp(Src) 97.6 F (36.4 C)  Resp 19  Ht 5\' 3"  (1.6 m)  Wt 144 lb 9.6 oz (65.59 kg)  BMI 25.62 kg/m2  SpO2 97%  Constitutional: Frail elderly female in no acute distress.  HEENT: Normocephalic and atraumatic. PERRL. EOM intact. No scleral icterus.  Neck: No lymphadenopathy, masses, or thyromegaly. No JVD or carotid bruits. Cardiac: Normal S1, S2. RRR without appreciable murmurs, rubs, or gallops. Distal pulses intact. Trace pitting edema of BLE Lungs: No respiratory distress. Breath sounds clear bilaterally without rales, rhonchi, or wheezes. Abdomen: Audible bowel sounds in all quadrants. Soft, nontender, nondistended.  Musculoskeletal: No joint erythema or tenderness. GU: Foley cath in place Skin: Warm and dry. No rash noted. See HPI Neurological: Alert. Hand tremors noted.  Psychiatric: withdrawn    Labs Reviewed  CBC Latest Ref Rng 05/22/2014 04/28/2014 12/18/2012  WBC - 8.1 11.3 12.4(H)  Hemoglobin 12.0 - 16.0 g/dL 10.9(A) 10.3(A) 12.4  Hematocrit 36 - 46 % 35(A) 34(A) 38.5  Platelets 150 - 399 K/L 209 282 -    CMP Latest Ref Rng 06/25/2014 05/22/2014 04/28/2014  Glucose 65 - 99 mg/dL - - -  BUN 4 - 21 mg/dL 16 21 25(A)  Creatinine 0.5 - 1.1 mg/dL 0.9 1.6(A) 0.6  Sodium 137 - 147 mmol/L 136(A) 139 138  Potassium 3.4 - 5.3 mmol/L 4.5 3.6 4.2  Chloride 97 - 108 mmol/L - - -  CO2 18 - 29 mmol/L - - -  Calcium 8.6 - 10.2 mg/dL - - -  Total Protein 6.0 - 8.5 g/dL - - -  Albumin 3.5 - 4.7 g/dL - - -  Total Bilirubin 0.0 - 1.2 mg/dL - - -  Alkaline Phos 39 - 117 IU/L - - -  AST 0 - 40 IU/L - - -  ALT 0 - 32 IU/L - - -    Assessment & Plan 1. Parkinson's disease, Lewy body Advanced wiht further decline anticipated. Continue sinemet 25/100mg  three times daily. continue primidone 50mg  daily for tremors and seroquel 25mg  daily in the morning and 50mg  daily at bedtime for agitation and hallucination. Continue fall risk and pressure ulcer precautions.  Continue total assist with ADLs  2. Depression due to dementia Mood stable. Continue lexapro 10mg  daily and monitor for change in mood  3. Gastroesophageal reflux disease, esophagitis presence not specified Stable. Continue zantac 150mg  daily and monitor  4. Constipation, unspecified constipation  type Stable. Continue senna s 2 tabs daily. Encouge hydration.   5. Urinary retention No issues. Continue Foley catheter. Nursing staff to record output each shift on MAR.    6. Protein-calorie malnutrition Has 2 lbs weight gain over the past 30 days, but decline is anticipated due to demenitia. Continue Puree diet with honey thicken liquids and magic cup twice daily. Continue to monitor her status.   7. Stage 2 PU of left buttock Improving. Continue wound care with NS, silver alginate, and foam dressing every 3 days. Continue tylenol 650mg  every six hours as needed with roxanol 5mg  every four hours as needed for pain. Continue to monitor her status.    Family/Staff Communication Plan of care discussed with nursing staff. Nursing staff verbalized understanding and agree with plan of care. No additional questions or concerns reported.    Arthur Holms, MSN, AGNP-C [facility] [address] [place], [place] [address] [phone] [8am-5pm] After hours: [phone]

## 2014-09-05 DIAGNOSIS — F0281 Dementia in other diseases classified elsewhere with behavioral disturbance: Secondary | ICD-10-CM

## 2014-09-05 DIAGNOSIS — F22 Delusional disorders: Secondary | ICD-10-CM | POA: Diagnosis not present

## 2014-09-05 DIAGNOSIS — G20 Parkinson's disease: Secondary | ICD-10-CM | POA: Diagnosis not present

## 2014-09-05 DIAGNOSIS — G3183 Dementia with Lewy bodies: Secondary | ICD-10-CM

## 2014-09-15 ENCOUNTER — Non-Acute Institutional Stay (SKILLED_NURSING_FACILITY): Payer: Medicare Other | Admitting: Internal Medicine

## 2014-09-15 DIAGNOSIS — F0280 Dementia in other diseases classified elsewhere without behavioral disturbance: Secondary | ICD-10-CM | POA: Diagnosis not present

## 2014-09-15 DIAGNOSIS — L8992 Pressure ulcer of unspecified site, stage 2: Secondary | ICD-10-CM

## 2014-09-15 DIAGNOSIS — G20 Parkinson's disease: Secondary | ICD-10-CM | POA: Diagnosis not present

## 2014-09-15 DIAGNOSIS — R1319 Other dysphagia: Secondary | ICD-10-CM | POA: Insufficient documentation

## 2014-09-15 DIAGNOSIS — G3183 Dementia with Lewy bodies: Secondary | ICD-10-CM | POA: Insufficient documentation

## 2014-09-15 DIAGNOSIS — F329 Major depressive disorder, single episode, unspecified: Secondary | ICD-10-CM

## 2014-09-15 DIAGNOSIS — R339 Retention of urine, unspecified: Secondary | ICD-10-CM | POA: Insufficient documentation

## 2014-09-15 DIAGNOSIS — E46 Unspecified protein-calorie malnutrition: Secondary | ICD-10-CM

## 2014-09-15 DIAGNOSIS — R1310 Dysphagia, unspecified: Secondary | ICD-10-CM

## 2014-09-15 DIAGNOSIS — F0393 Unspecified dementia, unspecified severity, with mood disturbance: Secondary | ICD-10-CM

## 2014-09-15 NOTE — Progress Notes (Signed)
Patient ID: Kristin Frey, female   DOB: 28-Oct-1929, 81 y.o.   MRN: [id number]    Facility: [facility]   Code status: dnr  Chief Complaint  Patient presents with  . Medical Management of Chronic Issues    Allergies  Allergen Reactions  . Penicillins Anaphylaxis  . Fish Allergy Hives   HPI 81 y/o female pt is seen for routine follow up. She is under hospice care. She has hx of dementia with parkinson's feature, depression, constipation among others. She is seen in her room. Does not participate in conversation. She is in no distress. No new concerns from staff. No falls reported. No new behavioral changes reported. Has skin tears  ROS Unable to obtain  Past Medical History  Diagnosis Date  . Parkinson disease   . Carotid artery disease   . Hernia   . Retinopathy   . DVT (deep venous thrombosis) 1998  . High cholesterol   . Hypertension   . Hypothyroidism   . Atrial fibrillation   . Cancer 1998    left breast mastectomy chemo and radiation  . Cancer 2014    right breast wide excision   . Diabetes      Medication List       This list is accurate as of: 09/15/14  1:22 PM.  Always use your most recent med list.               acetaminophen 325 MG tablet  Commonly known as:  TYLENOL  Take 650 mg by mouth every 6 (six) hours as needed.     carbidopa-levodopa 25-100 MG per tablet  Commonly known as:  SINEMET IR  Take 1 tablet by mouth 3 (three) times daily.     escitalopram 10 MG tablet  Commonly known as:  LEXAPRO  Take 10 mg by mouth daily.     morphine 20 MG/ML concentrated solution  Commonly known as:  ROXANOL  Take 5 mg by mouth every hour as needed for severe pain.     primidone 50 MG tablet  Commonly known as:  MYSOLINE  Take 50 mg by mouth 2 (two) times daily.     QUEtiapine 25 MG tablet  Commonly known as:  SEROQUEL  Take 25-50 mg by mouth. 25mg  qam and 50mg  QHS     ranitidine 150 MG tablet  Commonly known as:  ZANTAC    Take 1 tablet (150 mg total) by mouth 2 (two) times daily.     sennosides-docusate sodium 8.6-50 MG tablet  Commonly known as:  SENOKOT-S  Take 2 tablets by mouth daily.       Physical exam BP 128/74 mmHg  Pulse 68  Temp(Src) 98 F (36.7 C)  Resp 16  General- elderly female in no acute distress Head- atraumatic, normocephalic Eyes- no pallor, no icterus, no discharge Mouth- normal mucus membrane Cardiovascular- normal s1,s2, no murmurs Respiratory- bilateral clear to auscultation, no wheeze, no rhonchi, no crackles Abdomen- bowel sounds present, soft, non tender, foley in place Musculoskeletal- able to move all 4 extremities, no leg edema Neurological- resting hand tremors Skin- warm and dry, right elbow skin tear, sacra pressure ulcer stage 2 Psychiatry- alert and calm  Assessment/plan  Lewy body dementia No behavioral disturbance, monitor clinically, continue sinemet, mysoline. Monitor. Continue assidstance with ADLs, continue hospice care. Skin care. Fall precautions. On roxanol  Protein calorie malnutrition Continue magic cup supplement, encourage po intake, monitor weight, continue pressure ulcer treatment, pressure ulcer prophylaxis with fall  precautions  Dysphagia With her cognitive impairment, continue puree with honey thick liquids, aspiration precautions, feeding assistance  Depression with dementia Continue lexapro and seroquel, monitor clinically  Urinary retention Continue foley, provide foley care  Stage 2 pressure ulcer Continue pressure ulcer treatment with prophylaxis, frequent repositioning, continue care for skin tear, continue protein supplement

## 2014-09-24 ENCOUNTER — Ambulatory Visit: Payer: Medicare Other | Admitting: Diagnostic Neuroimaging

## 2014-09-29 ENCOUNTER — Encounter: Payer: Self-pay | Admitting: Registered Nurse

## 2014-09-29 ENCOUNTER — Non-Acute Institutional Stay (SKILLED_NURSING_FACILITY): Payer: Medicare Other | Admitting: Registered Nurse

## 2014-09-29 DIAGNOSIS — R1310 Dysphagia, unspecified: Secondary | ICD-10-CM

## 2014-09-29 DIAGNOSIS — R062 Wheezing: Secondary | ICD-10-CM

## 2014-09-29 DIAGNOSIS — R05 Cough: Secondary | ICD-10-CM

## 2014-09-29 DIAGNOSIS — R059 Cough, unspecified: Secondary | ICD-10-CM

## 2014-09-29 NOTE — Progress Notes (Signed)
Patient ID: Kristin Frey, female   DOB: Jan 06, 1930, 81 y.o.   MRN: [id number]   Place of Service: [facility]  Allergies  Allergen Reactions  . Penicillins Anaphylaxis  . Fish Allergy Hives    Code Status: DNR  Goals of Care: Comfort and Quality of Life/Hospice  Chief Complaint  Patient presents with  . Acute Visit    cough    HPI 81 y.o. female Hospice patient with PMH of PD, dementia, DM, HTN, depression, constipation, urinary rentention with foley cath among others is being seen for an acute visit at the request of family for the evaluation of "croupy cough" x 1 day. Stated that cough occurs after eating/drinking. Would like for her lungs to be examined. Denies any other concerns. Nursing staff reported hearing patient coughing after eating. No other concerns reported.   Review of Systems Unable to obtain due to dementia but appears in no acute distress.   Past Medical History  Diagnosis Date  . Parkinson disease   . Carotid artery disease   . Hernia   . Retinopathy   . DVT (deep venous thrombosis) 1998  . High cholesterol   . Hypertension   . Hypothyroidism   . Atrial fibrillation   . Cancer 1998    left breast mastectomy chemo and radiation  . Cancer 2014    right breast wide excision   . Diabetes     Past Surgical History  Procedure Laterality Date  . Appendectomy  1949  . Carpal tunnel release Bilateral 1976  . Eye surgery  [phone]  . Cholecystectomy  2007  . Breast surgery Left 1998    mastectomy  . Breast surgery Right 2014    wide excision, sentinel node bx, mastoplasty,MammoSite    History   Social History  . Marital Status: Married    Spouse Name: N/A  . Number of Children: 3  . Years of Education: 12   Occupational History  . N/A    Social History Main Topics  . Smoking status: Never Smoker   . Smokeless tobacco: Never Used  . Alcohol Use: No  . Drug Use: No  . Sexual Activity: No   Other Topics Concern  . Not on  file   Social History Narrative   Patient lives at home alone.   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 Brother developed Parkinson's use to "chew coal" as a child.)   Caffeine Use: 2-3 cups caffeine daily    Family History  Problem Relation Age of Onset  . Heart attack Mother   . Heart disease Mother   . Heart disease Brother   . Parkinsonism Brother       Medication List       This list is accurate as of: 09/29/14  2:47 PM.  Always use your most recent med list.               acetaminophen 325 MG tablet  Commonly known as:  TYLENOL  Take 650 mg by mouth every 6 (six) hours as needed.     carbidopa-levodopa 25-100 MG per tablet  Commonly known as:  SINEMET IR  Take 1 tablet by mouth 3 (three) times daily.     escitalopram 10 MG tablet  Commonly known as:  LEXAPRO  Take 10 mg by mouth daily.     ipratropium-albuterol 0.5-2.5 (3) MG/3ML Soln  Commonly known as:  DUONEB  Take 3 mLs by  nebulization every 6 (six) hours as needed.     morphine 20 MG/ML concentrated solution  Commonly known as:  ROXANOL  Take 5 mg by mouth every hour as needed for severe pain.     primidone 50 MG tablet  Commonly known as:  MYSOLINE  Take 50 mg by mouth 2 (two) times daily.     QUEtiapine 25 MG tablet  Commonly known as:  SEROQUEL  Take 25-50 mg by mouth. 25mg  qam and 50mg  QHS     ranitidine 150 MG tablet  Commonly known as:  ZANTAC  Take 1 tablet (150 mg total) by mouth 2 (two) times daily.     sennosides-docusate sodium 8.6-50 MG tablet  Commonly known as:  SENOKOT-S  Take 2 tablets by mouth daily.        Physical Exam  BP 130/76 mmHg  Pulse 74  Temp(Src) 97.9 F (36.6 C)  Resp 17  SpO2 96%  Constitutional: Frail elderly female in no acute distress.  HEENT: Normocephalic and atraumatic. PERRL. EOM intact. No scleral icterus.  Neck: No lymphadenopathy, masses, or thyromegaly. No JVD or carotid bruits. Cardiac:  Normal S1, S2. RRR without appreciable murmurs, rubs, or gallops. Distal pulses intact. Trace pitting edema of BLE Lungs: No respiratory distress. Left lung breath sound clear. Right lung field with wheezes.  Abdomen: Audible bowel sounds in all quadrants. Soft, nontender, nondistended.  Musculoskeletal: No joint erythema or tenderness. GU: Foley cath in place Skin: Warm and dry. No rash noted. Bruises noted on bue. Neurological: Arousable. Hand tremors noted.  Psychiatric: withdrawn    Labs Reviewed  CBC Latest Ref Rng 05/22/2014 04/28/2014 12/18/2012  WBC - 8.1 11.3 12.4(H)  Hemoglobin 12.0 - 16.0 g/dL 10.9(A) 10.3(A) 12.4  Hematocrit 36 - 46 % 35(A) 34(A) 38.5  Platelets 150 - 399 K/L 209 282 -    CMP Latest Ref Rng 06/25/2014 05/22/2014 04/28/2014  Glucose 65 - 99 mg/dL - - -  BUN 4 - 21 mg/dL 16 21 25(A)  Creatinine 0.5 - 1.1 mg/dL 0.9 1.6(A) 0.6  Sodium 137 - 147 mmol/L 136(A) 139 138  Potassium 3.4 - 5.3 mmol/L 4.5 3.6 4.2  Chloride 97 - 108 mmol/L - - -  CO2 18 - 29 mmol/L - - -  Calcium 8.6 - 10.2 mg/dL - - -  Total Protein 6.0 - 8.5 g/dL - - -  Albumin 3.5 - 4.7 g/dL - - -  Total Bilirubin 0.0 - 1.2 mg/dL - - -  Alkaline Phos 39 - 117 IU/L - - -  AST 0 - 40 IU/L - - -  ALT 0 - 32 IU/L - - -    Assessment & Plan 1. Wheezing Afebrile. VS stable. Duoneb every six hours x 3 days then every six hours as needed for wheezing. Continue to monitor her status.   2. Dysphagia Continue pureed diet with nectar thickened liquid for now. Continue aspiration precautions.  3. Cough Cough after eating per nursing and family. Possible worsening of dysphagia. See #2. Speech consult initiated. Continue to monitor for now.   Family/Staff Communication Plan of care discussed with nursing staff. Nursing staff verbalized understanding and agree with plan of care. No additional questions or concerns reported.    Arthur Holms, MSN, AGNP-C [facility] [address] [place],  [place] [address] [phone] [8am-5pm] After hours: [phone]

## 2014-10-03 NOTE — Op Note (Signed)
PATIENT NAME:  Kristin Frey, Kristin Frey MR#:  [id number] DATE OF BIRTH:  07/28/1929  DATE OF PROCEDURE:  01/02/2013  PREOPERATIVE DIAGNOSIS: Right breast cancer.   POSTOPERATIVE DIAGNOSIS: Right breast cancer.  OPERATIVE PROCEDURE: Right breast wide excision, mastoplasty, sentinel node biopsy.   SURGEON: Robert Bellow, MD   ANESTHESIA: General by LMA.   ESTIMATED BLOOD LOSS: Less than 30 mL.   CLINICAL NOTE: This 81 year old woman had recently been identified with a 2.2 cm right breast mass. Core biopsy showed evidence of invasive mammary carcinoma. She desired breast conservation.   The patient was injected with technetium sulfur colloid prior to the procedure. After the induction of general anesthesia and cleansing the periareolar skin with alcohol, 4 mL of a mixture of methylene blue and normal saline diluted 1:2 was injected in the subareolar plexus. The breast, chest and axilla were then prepped with ChloraPrep and draped. Ultrasound was used to identify the mass at the 9 o'clock position and outline its borders. It was within a centimeter of the skin and it was elected to do a skin excision as well on the chance that she would be a good candidate for partial breast radiation. Attention returned to the axilla and an area of increased uptake was identified. The area was incised sharply after the injection of 0.5% Marcaine with 1:200,000 units of epinephrine. A total of 30 mL of this local anesthetic was used during the procedure. Skin was incised sharply and then hemostasis achieved with electrocautery. A single hot blue node was identified in the axilla. Touch preps were negative for macrometastatic disease, reported by Bryan Lemma, MD, from pathology. Scanning after resection of this primary lymph node showed no other areas of increased uptake. The wound was eventually closed in layers with 2-0 Vicryl figure-of-eight sutures to the deep tissue and a running 4-0 Vicryl subcuticular suture for the  skin.   Attention was turned to the breast, pending frozen section report on the axillary node. An ellipse was made running from near the edge of the areola at the 9 o'clock position to the lateral aspect of the breast. This was incised sharply with the blade and the remaining dissection completed with electrocautery. Hemostasis was with electrocautery as well as 3-0 Vicryl ties. The mass was orientated and specimen radiograph confirmed a density in the central portion of the specimen. Gross examination by Dr. Dicie Beam showed no areas to suggest close margins. After assuring meticulous hemostasis, the breast parenchyma was elevated off the underlying muscle, leaving the fascia intact. This was then approximated with interrupted 2-0 Vicryl figure-of-eight sutures.   Approximately 1.5 cm below the level of the skin flaps were elevated and the underlying soft tissue approximated with interrupted 2-0 Vicryl sutures. This was to allow for cavity should MammoSite balloon treatment be appropriate. The skin was then closed with a running 3-0 Vicryl subcuticular suture. Benzoin and Steri-Strips followed by Telfa dressings were applied. Fluff gauze, Kerlix and an Ace wrap were then applied. The patient tolerated the procedure well and was taken to the recovery room in stable condition.    ____________________________ Robert Bellow, MD jwb:jm D: 01/02/2013 18:33:16 ET T: 01/02/2013 21:00:46 ET JOB#: [id number]  cc: Robert Bellow, MD, <Dictator> Perrin Maltese, MD JEFFREY Amedeo Kinsman MD ELECTRONICALLY SIGNED 01/04/2013 15:19

## 2014-10-03 NOTE — Consult Note (Signed)
Reason for Visit: This 81 year old Female patient presents to the clinic for initial evaluation of  breast cancer .   Referred by Dr. Hervey Ard.  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  Pathology Report pathology report reviewed   Imaging Report mammograms reviewed   Referral Report clinical notes reviewed   Planned Treatment Regimen possible accelerated partial breast irradiation   HPI   patient is an 81 year old female status post left mastectomy back in 1998 in [place] for stage IIIa invasive mammary carcinoma status post chemotherapy and adjuvant radiation therapy to her left chest wall and peripheral lymphatics. She has a history of DVT and is maintained on Coumadin. Recent mammogram was abnormalshowing focal asymmetry in the right breast at the level of the nipple area lower line with a cluster of round calcifications.underwent biopsy positive for invasive mucinous carcinoma. Status post wide local excision for a 1.5-2 cm invasive mucinous carcinoma grade 1 overall well-differentiated. Margins were clear except for superior focus of involvement of ductal carcinoma in situ. Tumor was ER/PR positive strongly HER-2/neu not overexpressed. One sentinel lymph node was negative. Patient has significant problems with back pain. Walks with the aid of a cane. She is now referred to radiation oncology for opinion. Her lumpectomy scar is healing well. She is having no significant pain in the right breast.  Past Hx:    Bilateral Breast Cancer:    DVT - Deep Vein Thrombosis:    Hyperlipidemia:    HTN:    Bilateral Carpel Tunnel Surgery:    Cholecystectomy:    Appendectomy:    Mastectomy: left  Past, Family and Social History:  Past Medical History positive   Cardiovascular  coronary artery disease; hyperlipidemia; hypertension   Neurological/Psychiatric retinopathy   Past Surgical History appendectomy; cholecystectomy; status post mastectomy for breast cancer on the left side   Past Medical History Comments bilateral carpal tunnel syndrome, history of DVT on Coumadin   Family History noncontributory   Social History noncontributory   Additional Past Medical and Surgical History accompanied by her daughter today   Allergies:   PCN: SOB, Swelling  Shellfish: Hives  Home Meds:  Home Medications: Medication Instructions Status  traMADol 50 mg oral tablet 1-2 tab(s) orally every 4 to 6 hours, As needed, pain Active  MiraLax - oral powder for reconstitution 17 gram(s) orally once a day Active  Senna S 50 mg-8.6 mg oral tablet 2 tab(s) orally once a day (at bedtime) Active  Tylenol 325 mg oral tablet 2 tab(s) orally every 4 hours, As Needed - for Pain Active  enalapril 10 mg oral tablet 1 tab(s) orally once a day (in the morning) Active  venlafaxine 75 mg oral tablet 1 tab(s) orally once a day Active  lansoprazole 30 mg oral delayed release capsule 1 cap(s) orally once a day (in the morning) Active  meclizine 12.5 mg oral tablet 1 tab(s) orally once a day Active  calcium citrate 1 tab(s) orally once a day Active  Vitamin D3 1000 intl units oral tablet 1 tab(s) orally once a day Active  Fish Oil 1000 mg oral capsule 1 cap(s) orally 2 times a day Active  ropinirole 1 mg oral tablet 2 tab(s) orally 3 times a day Active  cyclobenzaprine 10 mg oral tablet 1 tab(s) orally once a day (at bedtime) Active  levothyroxine 50 mcg (0.05 mg) oral tablet 1 tab(s) orally once a day Active  simvastatin 40 mg oral tablet 1 tab(s) orally once a day (at bedtime) Active   Review of Systems:  General negative   Performance Status (ECOG) 0   Skin negative   Breast see HPI   Ophthalmologic negative   ENMT negative   Respiratory and Thorax negative   Cardiovascular  negative   Gastrointestinal negative   Genitourinary negative   Musculoskeletal negative   Neurological negative   Psychiatric negative   Hematology/Lymphatics negative   Endocrine negative   Allergic/Immunologic negative   Nursing Notes:  Nursing Vital Signs and Chemo Nursing Nursing Notes: *CC Vital Signs Flowsheet:   30-Jul-14 09:45  Temp Temperature 98.8  Pulse Pulse 91  Respirations Respirations 20  SBP SBP 136  DBP DBP 79  Pain Scale (0-10)  0  Current Weight (kg) (kg) 90.6  Height (cm) centimeters 148.9  BSA (m2) 1.8   Physical Exam:  General/Skin/HEENT:  Eyes normal   ENMT normal   Head and Neck normal   Additional PE well-developed slightly obese female in NAD uses a cane transitory assistance. She status post left modified radical mastectomy. She has telangiectatic changes of the skin from high-dose radiation of the left chest wall. She status post recent wide local excision of the right breast. Incision is healing well. No dominant mass or nodularity is noted in the right breast. No axillary or supraclavicular adenopathy is appreciated. No significant with edema it exists in her left upper extremity. Cardiac examination shows regular rate and rhythm. Lungs are clear to A&P.   Breasts/Resp/CV/GI/GU:  Respiratory and Thorax normal   Cardiovascular normal   Gastrointestinal normal   Genitourinary normal   MS/Neuro/Psych/Lymph:  Musculoskeletal normal   Neurological normal   Lymphatics normal   Other Results:  Radiology Results: [place]:    20-Jun-14 11:25, US Breast Right  US Breast Right   REASON FOR EXAM:    av rt focal asymmetry  COMMENTS:       PROCEDURE: [place]  - US BREAST RIGHT  - Nov 30 2012 11:25AM     RESULT:     Comparison: 11/22/2012.     Findings:  Spot compression magnification views were performed to evaluate the   findings inthe lateral right breast on the prior mammograms. These   images demonstrate what appears to be a mass  in the lateral right breast   at approximately 9 o'clock. Some of the borders are well-circumscribed   while other borders are irregular or obscured by overlapping     fibroglandular tissue.    Real time ultrasound was performed of the lateral right breast from 8   o'clock through 10 o'clock. At 9 o'clock there is an irregular hypoechoic   mass. The borders are ill-defined. Exact measurement is difficult to   obtain secondary to their ill-defined borders, but the mass measures   approximately 2.9 x 2.9 x 1.9 cm. There is posterior acoustic shadowing.    IMPRESSION:    BI-RADS: Category 4 - Suspicious Abnormality.      Surgical consultation and tissue diagnosis are recommended for the   irregular mass in the lateral right breast.  Thank you for the opportunity to contribute to the care of your patient.               Verified By: Gregor Hams, M.D., MD  LabUnknown:    12-Jun-14 14:29, Digital Unilateral Right Breast  PACS Image  20-Jun-14 10:22, Digital Additonal Views Rt Breast (DIAG)  PACS Image     20-Jun-14 11:25, US Breast Right  PACS [place]:    12-Jun-14 14:29, Digital Unilateral Right Breast  Digital Unilateral Right Breast   REASON FOR EXAM:    HX BRST CA LT MAST  COMMENTS:       PROCEDURE: MAM - MAM Terisa Starr MAM RT BREAST W/CAD  - Nov 22 2012  2:29PM     RESULT:     Comparison: 11/08/2011, 10/06/2010, 09/02/2009, 08/27/2008.    Findings:    There is scattered fibroglandulartissue in the right breast. The patient   is status post left mastectomy. There is a focal asymmetry in the lateral   right breast at or just above the level of the nipple line. The small   cluster of round calcifications in the posterior depth of the right CC     view midline is similar to multiple prior studies.    IMPRESSION:     1. BI-RADS: Category 0 - Needs Additional Imaging Evaluation.   2. Recommend spot compression magnification views of the focal asymmetry    in the lateral right breast.    BREAST COMPOSITION: The breast composition is SCATTERED FIBROGLANDULAR   TISSUE (glandular tissue is 25-50%).     Thank you for this opportunity to contribute to the care of your patient.    A NEGATIVE MAMMOGRAM REPORT DOES NOT PRECLUDE BIOPSY OROTHER EVALUATION   OF A CLINICALLY PALPABLE OR OTHERWISE SUSPICIOUS MASS OR LESION. BREAST     CANCER MAY NOT BE DETECTED BY MAMMOGRAPHY IN UP TO 10% OF CASES.         Verified By: Gregor Hams, M.D., MD    20-Jun-14 10:22, Digital Additonal Views Rt Breast (DIAG)  Digital Additonal Views Rt Breast (DIAG)   REASON FOR EXAM:    av rt focal asymmetry  COMMENTS:       PROCEDURE: MAM - MAM DGTL Harrison ADD VIEWS RT  DIAG  - Nov 30 2012 10:22AM     RESULT:     Comparison: 11/22/2012.     Findings:  Spot compression magnification views were performed to evaluate the   findings in the lateral right breast on the prior mammograms. These   images demonstrate what appears to be a mass in the lateral right breast   at approximately 9 o'clock. Some of the borders are well-circumscribed   while other borders are irregular or obscured by overlapping     fibroglandular tissue.    Real time ultrasound was performed of the lateral right breast from 8   o'clock through 10 o'clock. At 9 o'clock there is an irregular hypoechoic   mass. The borders are ill-defined. Exact measurement is difficult to   obtain secondary to their ill-defined borders, but the mass measures   approximately 2.9 x 2.9 x 1.9 cm. There is posterior acoustic shadowing.    IMPRESSION:    BI-RADS: Category 4 - Suspicious Abnormality.      Surgicalconsultation and tissue diagnosis are recommended for the   irregular mass in the lateral right breast.  A NEGATIVE MAMMOGRAM REPORT DOES NOT PRECLUDE BIOPSY OR OTHER EVALUATION   OF A CLINICALLY PALPABLE OR OTHERWISE SUSPICIOUS MASS OR LESION. BREAST   CANCER MAY NOT BE DETECTED BY MAMMOGRAPHY IN UP TO  10% OF CASES.    Thank you for the opportunity to contribute to the care of your patient.  Verified By: Gregor Hams, M.D., MD   Relevent Results:   Relevant Scans and Labs mammograms and ultrasound are reviewed.   Assessment and Plan: Impression:   pathologic stage I invasive mucinous carcinoma of the right breast status post wide local excision and sentinel node biopsy in 81 year old female with multiple medical comorbidities Plan:   I discussed the case personally with Dr. Hervey Ard. Based on her age and overall tumor status believe she is a good candidate for accelerated partial breast irradiation. I am not concerned about her positive focal margin for ductal carcinoma in situ. Most studies have shown a doubling of recurrence based on positive margin although in our experience this will put it still at less than 5% risk of local regional recurrence using accelerated partial breast irradiation. I've talked to Dr. Hervey Ard about placing MammoSite catheter and undergoing high-dose rate remote afterloading to 3400 cGy in 10 fractions at 340 cGy delivered 1 cm from the balloon capsule on a twice a day basis. Risks and benefits of treatment were explained to the patient and her daughter. Side effects such as thickening in the lumpectomy site, possible small area of skin changes over the balloon catheter, fatigue, although explained in detail. Should her catheter been too close proximity to the chest wall or skin may have to go back to whole breast radiation as our second alternative. Patient and daughter both comprehend our treatment plan well. Patient will be evaluated for MammoSite catheter placement and set up for BrachyVision treatment planning shortly thereafter.  I would like to take this opportunity to thank you for allowing me to continue to participate in this patient's care.  CC Referral:  cc: Dr. Hervey Ard, Dr. Lamonte Sakai   Electronic  Signatures: Baruch Gouty, Roda Shutters (MD)  (Signed 30-Jul-14 11:19)  Authored: HPI, Diagnosis, Past Hx, PFSH, Allergies, Home Meds, ROS, Nursing Notes, Physical Exam, Other Results, Relevent Results, Encounter Assessment and Plan, CC Referring Physician   Last Updated: 30-Jul-14 11:19 by Armstead Peaks (MD)

## 2014-10-04 NOTE — Consult Note (Signed)
Follow-up for this 81 year old woman with dementia associated with Parkinson's disease.  No new changes to behavior.  Overall continues to be cooperative.  Family has stated that she is unmanageable at home.  We are trying to arrange transfer to either a geriatric psychiatry unit or appropriate placement.  No change to current medicine.  Blood pressure is slightly elevated but not to an extreme degree.  Electronic Signatures: Gonzella Lex (MD)  (Signed on 20-Jun-15 20:55)  Authored  Last Updated: 20-Jun-15 20:55 by Gonzella Lex (MD)

## 2014-10-04 NOTE — Consult Note (Signed)
PATIENT NAME:  Kristin Frey, Kristin Frey MR#:  [id number] DATE OF BIRTH:  08-20-1929  DATE OF CONSULTATION:  11/29/2013  CONSULTING PHYSICIAN:  Gonzella Lex, MD  IDENTIFYING INFORMATION AND REASON FOR CONSULT:  An 81 year old woman brought by her family to the Emergency Room with complaints of worsening behavior problems. Consultation for psychiatric management.   HISTORY OF PRESENT ILLNESS:  Information obtained from the patient and the patient's daughter, whom I spoke to by phone. The patient herself is unable to give me a clear reason why she is in the hospital. She says that she was having some disagreements with her family and that is why they brought her to the hospital. She is vague about the nature of the disagreements. When I pressed her for specifics, she seemed to become confused. Said that she was worried a lot about finances and that they were getting angry at her about that. She tells me that she had been living with her daughter until recently when she moved back into her own home with her granddaughter. After that, her story becomes confused. When I gently confronted the patient with the information on the commitment paperwork, namely that she had been accusing her family of poisoning her and that she had been running out into the street dangerously, the patient denied both of these, although she admitted that she had had concerns that there was something wrong with the food and that she had left the home to go visit a neighbor. The patient says that her mood has been a little bit moody. She absolutely denies any suicidal or homicidal ideation. When I ask her about hallucinations, she tells me that she does not care to watch television any more because it often frightens her for reasons that she cannot understand. She, according to her daughter, has been showing worsening signs of confusion and psychosis that have been progressively getting worse since April. Daughter describes frequent  hallucinations, visual and auditory, along with bizarre behavior. The patient has been paranoid, believing that there are drug dealers living in the house, refusing to go into certain parts of the house, showing emotional lability with outbursts of anger at the family. Daughter says that the patient has been refusing to take her medication, believing it to be poisoned and has been recently refusing to eat. The daughter has been trying to take care of the patient at home for a couple of years, but at this point, the behavior has gotten beyond their safe management.   PAST PSYCHIATRIC HISTORY:  Evidently there is no past psychiatric history at all. The patient has a history of Parkinson disease and they have seen a neurologist and their primary care doctor, who have told him they believe that what we are seeing is Lewy body dementia or dementia associated with Parkinson disease. So far, the treatment has primarily been cholinesterase inhibitor.   SOCIAL HISTORY:  The patient is a widow, her husband having died several years ago. She is a breast cancer survivor. She was living with her daughter and her daughter's family until recently. She was demanding to go back to her own home where she had not lived for some time. The daughter agreed to this request thinking that it would perhaps calm her mother down. Instead, the mother became even more paranoid and agitated over the last couple of days.   FAMILY HISTORY:  None identified.   PAST MEDICAL HISTORY:  The patient is a survivor of breast cancer, having had surgery and radiation  treatment and is still on some maintenance medication. It is apparently considered in long-term remission. She has mild diabetes and high blood pressure. She is on Coumadin for reasons that I am not entirely clear about.   REVIEW OF SYSTEMS:  The patient denies any specific pain. Denies any GI, pulmonary or cardiac complaints. Does not say she is feeling sick. She does say she is  feeling a little bit tired. Denies any suicidal or homicidal ideation. Denies any hallucinations. Generally, a negative review of systems.   MENTAL STATUS EXAMINATION:  Elderly woman in no particular distress, interviewed in the Emergency Room. She was cooperative with the interview. Eye contact was intermittent. Psychomotor activity restrained. She has what appear to be mild Parkinson symptoms, but is still able to use her hands to drink from a cup without spilling. Her speech was quiet, but understandable. She has a slight [place] accent, but speaks [place] fluently. Her affect was somewhat blunted. Mood was stated as being okay, but recently moody. Denies hallucinations. Denies suicidal or homicidal ideation. She was oriented to the fact that she was in a hospital in [place]. She was not able to tell me the year, but she was correct about the month. She was able to remember 1 out of 3 words at about 2 minutes. I did not go into much more specific cognitive testing.   VITAL SIGNS:  Blood pressure 145/65, respirations 18, pulse 81, temperature 98.1.   LABORATORY RESULTS:  No radiology tests have been ordered so far. The drug screen is positive for barbiturates. I am not quite sure what to make of that. TSH is normal at 2.05. Alcohol level negative. Chemistry panel unremarkable. CBC unremarkable. I do not see that any  coagulation studies have been ordered so far. Her urinalysis shows leukocyte esterase, but a number of white cells similar to the number of red cells and no bacteria seen. Salicylates and acetaminophen negative.   ASSESSMENT:  An 81 year old woman who has symptoms, per her daughter, that would be consistent for fairly rapid onset of Parkinson-associated dementia or Lewy body dementia with prominent symptoms of hallucinations and delusions and behavior problems. Currently, she is quite calm and not requiring any particular management in the Emergency Room. The daughter is adamant that she is  unable to care for her mother at home at this point.   TREATMENT PLAN:  Treatment for this condition is largely palliative and the medication approach is only partially helpful and has to be done carefully. I am not going to start her on any antipsychotics at this point because I think the risks far outweigh the benefit. She was on Aricept, and I am going to switch that to Exelon, which is probably a little more effective in helping with this condition. I will give her the patch, since she supposedly refuses medicines. Otherwise, I have started her on all of the prescription medicines that her daughter brought in on the list. I will get a PT checked, and I am going to go ahead and order a head CT, even though it is low yield. Essentially,  this patient needs placement. The Emergency Room social workers will need to work on referring her either to a geriatric psychiatry unit, or, if possible, placing her in an appropriate living situation.   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   SECONDARY DIAGNOSES: AXIS I:  No further diagnosis.  AXIS II:  No diagnosis.  AXIS III:  Status  post breast cancer, high blood pressure, diabetes, anticoagulant state, gastric reflux symptoms, hypothyroid.  AXIS IV:  Moderate to severe from worsening illness.  AXIS V:  Functioning at time of evaluation 37.    ____________________________ Gonzella Lex, MD jtc:dmm D: 11/29/2013 21:41:36 ET T: 11/29/2013 21:52:26 ET JOB#: [id number]  cc: Gonzella Lex, MD, <Dictator> Gonzella Lex MD ELECTRONICALLY SIGNED 12/03/2013 17:11

## 2014-10-14 ENCOUNTER — Non-Acute Institutional Stay (SKILLED_NURSING_FACILITY): Payer: Medicare Other | Admitting: Registered Nurse

## 2014-10-14 ENCOUNTER — Encounter: Payer: Self-pay | Admitting: Registered Nurse

## 2014-10-14 DIAGNOSIS — F329 Major depressive disorder, single episode, unspecified: Secondary | ICD-10-CM

## 2014-10-14 DIAGNOSIS — G20 Parkinson's disease: Secondary | ICD-10-CM | POA: Diagnosis not present

## 2014-10-14 DIAGNOSIS — E46 Unspecified protein-calorie malnutrition: Secondary | ICD-10-CM | POA: Diagnosis not present

## 2014-10-14 DIAGNOSIS — R339 Retention of urine, unspecified: Secondary | ICD-10-CM | POA: Diagnosis not present

## 2014-10-14 DIAGNOSIS — L249 Irritant contact dermatitis, unspecified cause: Secondary | ICD-10-CM | POA: Diagnosis not present

## 2014-10-14 DIAGNOSIS — K219 Gastro-esophageal reflux disease without esophagitis: Secondary | ICD-10-CM

## 2014-10-14 DIAGNOSIS — L89322 Pressure ulcer of left buttock, stage 2: Secondary | ICD-10-CM

## 2014-10-14 DIAGNOSIS — G3183 Dementia with Lewy bodies: Secondary | ICD-10-CM

## 2014-10-14 DIAGNOSIS — K5900 Constipation, unspecified: Secondary | ICD-10-CM | POA: Diagnosis not present

## 2014-10-14 DIAGNOSIS — F0280 Dementia in other diseases classified elsewhere without behavioral disturbance: Secondary | ICD-10-CM | POA: Diagnosis not present

## 2014-10-14 DIAGNOSIS — R1310 Dysphagia, unspecified: Secondary | ICD-10-CM | POA: Diagnosis not present

## 2014-10-14 DIAGNOSIS — F0393 Unspecified dementia, unspecified severity, with mood disturbance: Secondary | ICD-10-CM

## 2014-10-14 NOTE — Progress Notes (Signed)
Patient ID: Earnie Larsson, female   DOB: 1929/08/20, 81 y.o.   MRN: [id number]   Place of Service: [facility]  Allergies  Allergen Reactions  . Penicillins Anaphylaxis  . Fish Allergy Hives    Code Status: DNR  Goals of Care: Comfort and Quality of Life/Hospice  Chief Complaint  Patient presents with  . Medical Management of Chronic Issues    dementia, UR, PCM, dysphagia, depression    HPI 81 y.o. female Hospice patient with PMH of PD, dementia, DM, HTN, depression, constipation, urinary rentention with foley cath among others is being seen for a routine visit for management of her chronic issues. Weight stable. No recent falls or new skin concerns reported. No change in behaviors for functional status reported. No concerns from staff. Dementia is advanced. Still has Foley cath for chronic urinary retention. GERD stable on H2 blocker. Stage 2 pressure ulcer improved. Mood stable on lexapro and seroquel. Seen in room today. Unable to participate in HPI and ROS.   Review of Systems Unable to obtain due to dementia but appears in no acute distress.   Past Medical History  Diagnosis Date  . Parkinson disease   . Carotid artery disease   . Hernia   . Retinopathy   . DVT (deep venous thrombosis) 1998  . High cholesterol   . Hypertension   . Hypothyroidism   . Atrial fibrillation   . Cancer 1998    left breast mastectomy chemo and radiation  . Cancer 2014    right breast wide excision   . Diabetes     Past Surgical History  Procedure Laterality Date  . Appendectomy  1949  . Carpal tunnel release Bilateral 1976  . Eye surgery  [phone]  . Cholecystectomy  2007  . Breast surgery Left 1998    mastectomy  . Breast surgery Right 2014    wide excision, sentinel node bx, mastoplasty,MammoSite    History   Social History  . Marital Status: Married    Spouse Name: N/A  . Number of Children: 3  . Years of Education: 12   Occupational History  . N/A     Social History Main Topics  . Smoking status: Never Smoker   . Smokeless tobacco: Never Used  . Alcohol Use: No  . Drug Use: No  . Sexual Activity: No   Other Topics Concern  . Not on file   Social History Narrative   Patient lives at home alone.   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 Brother developed Parkinson's use to "chew coal" as a child.)   Caffeine Use: 2-3 cups caffeine daily    Family History  Problem Relation Age of Onset  . Heart attack Mother   . Heart disease Mother   . Heart disease Brother   . Parkinsonism Brother       Medication List       This list is accurate as of: 10/14/14  9:47 PM.  Always use your most recent med list.               acetaminophen 325 MG tablet  Commonly known as:  TYLENOL  Take 650 mg by mouth every 6 (six) hours as needed.     carbidopa-levodopa 25-100 MG per tablet  Commonly known as:  SINEMET IR  Take 1 tablet by mouth 3 (three) times daily.     escitalopram 10 MG tablet  Commonly known as:  LEXAPRO  Take 10 mg by mouth daily.     ipratropium-albuterol 0.5-2.5 (3) MG/3ML Soln  Commonly known as:  DUONEB  Take 3 mLs by nebulization every 6 (six) hours as needed.     morphine 20 MG/ML concentrated solution  Commonly known as:  ROXANOL  Take 5 mg by mouth every hour as needed for severe pain.     primidone 50 MG tablet  Commonly known as:  MYSOLINE  Take 50 mg by mouth 2 (two) times daily.     QUEtiapine 25 MG tablet  Commonly known as:  SEROQUEL  Take 25-50 mg by mouth. 25mg  qam and 50mg  QHS     ranitidine 150 MG tablet  Commonly known as:  ZANTAC  Take 1 tablet (150 mg total) by mouth 2 (two) times daily.     sennosides-docusate sodium 8.6-50 MG tablet  Commonly known as:  SENOKOT-S  Take 2 tablets by mouth daily.        Physical Exam  BP 132/76 mmHg  Pulse 74  Temp(Src) 97 F (36.1 C)  Resp 16  Ht 5\' 3"  (1.6 m)  Wt 145 lb (65.772 kg)  BMI  25.69 kg/m2  SpO2 96%  Constitutional: Frail elderly female in no acute distress.  HEENT: Normocephalic and atraumatic. PERRL. No scleral icterus.  Neck: No lymphadenopathy, masses, or thyromegaly. No JVD or carotid bruits. Cardiac: Normal S1, S2. RRR without appreciable murmurs, rubs, or gallops. Distal pulses intact. Trace pitting edema of BLE Lungs: No respiratory distress. Breath sounds clear bilaterally without rales, rhonchi, or wheezes. Abdomen: Audible bowel sounds in all quadrants. Soft, nontender, nondistended.  Musculoskeletal: No joint erythema or tenderness. GU: Foley cath in place Skin: Warm and dry. No rash noted. Stage 2 pressure ulcer of left buttock improving-almost resolved. Periwound/surrounding skin excoriated.   Neurological: Alert. Hand tremors noted.  Psychiatric: withdrawn    Labs Reviewed  CBC Latest Ref Rng 05/22/2014 04/28/2014 11/29/2013  WBC - 8.1 11.3 9.5  Hemoglobin 12.0 - 16.0 g/dL 10.9(A) 10.3(A) 12.5  Hematocrit 36 - 46 % 35(A) 34(A) 37.7  Platelets 150 - 399 K/L 209 282 256    CMP Latest Ref Rng 06/25/2014 05/22/2014 04/28/2014  Glucose 65-99 mg/dL - - -  BUN 4 - 21 mg/dL 16 21 25(A)  Creatinine 0.5 - 1.1 mg/dL 0.9 1.6(A) 0.6  Sodium 137 - 147 mmol/L 136(A) 139 138  Potassium 3.4 - 5.3 mmol/L 4.5 3.6 4.2  Chloride 98-107 mmol/L - - -  CO2 21-32 mmol/L - - -  Calcium 8.5-10.1 mg/dL - - -  Total Protein 6.4-8.2 g/dL - - -  Albumin 3.5 - 4.7 g/dL - - -  Total Bilirubin 0.0 - 1.2 mg/dL - - -  Alkaline Phos - - - -  AST 15-37 Unit/L - - -  ALT 12-78 U/L - - -    Assessment & Plan 1. Parkinson's disease, Lewy body Advanced. Continue sinemet 25/100mg  three times daily with primidone 50mg  daily for tremors and seroquel 25mg  daily in the morning and 50mg  daily at bedtime for agitation and hallucination. Continue total assist with ADLs. Fall risk and pressure ulcer precuations  2. Depression due to dementia Mood stable. Continue lexapro 10mg   daily. Monitor for change in mood  3. Gastroesophageal reflux disease, esophagitis presence not specified Stable. Continue zantac 150mg  twice daily  4. Constipation, unspecified constipation type Stable. Continue senna s 2 tabs daily. Encouge hydration.   5. Urinary retention On-going. Continue Foley catheter. Continue skin care.  6. Protein-calorie malnutrition Weight stable over the past 30 day. Continue Puree diet with honey thicken liquids and magic cup twice daily. Monitor weight monthly   7. Stage 2 PU of left buttock Improving-almost resolved. Continue wound care and pressure ulcer prophylaxis  8. Dermatitis, irritant Continue skin care with Barrier cream. Pressure ulcer precautions.  9. Dysphagia Stable. Continue pureed diet with honey thickened liquids. Aspiration precautions.   Family/Staff Communication Plan of care discussed with nursing staff. Nursing staff verbalized understanding and agree with plan of care. No additional questions or concerns reported.    Arthur Holms, MSN, AGNP-C [facility] [address] [place], [place] [address] [phone] [8am-5pm] After hours: [phone]

## 2014-11-04 DIAGNOSIS — G3183 Dementia with Lewy bodies: Secondary | ICD-10-CM | POA: Diagnosis not present

## 2014-11-04 DIAGNOSIS — F0281 Dementia in other diseases classified elsewhere with behavioral disturbance: Secondary | ICD-10-CM | POA: Diagnosis not present

## 2014-11-04 DIAGNOSIS — G20 Parkinson's disease: Secondary | ICD-10-CM | POA: Diagnosis not present

## 2014-11-04 DIAGNOSIS — F22 Delusional disorders: Secondary | ICD-10-CM | POA: Diagnosis not present

## 2014-12-10 IMAGING — CR DG ABDOMEN 1V
1 series · 1 of 1 positions shown · non-contrast
Comparison: none

REASON FOR EXAM: constipation
COMMENTS:   LMP: Post Hysterectomy

PROCEDURE:     DXR - DXR KIDNEY URETER BLADDER  - October 30, 2012 [DATE]
RESULT:     Comparison: None.

[t abdomen supine]
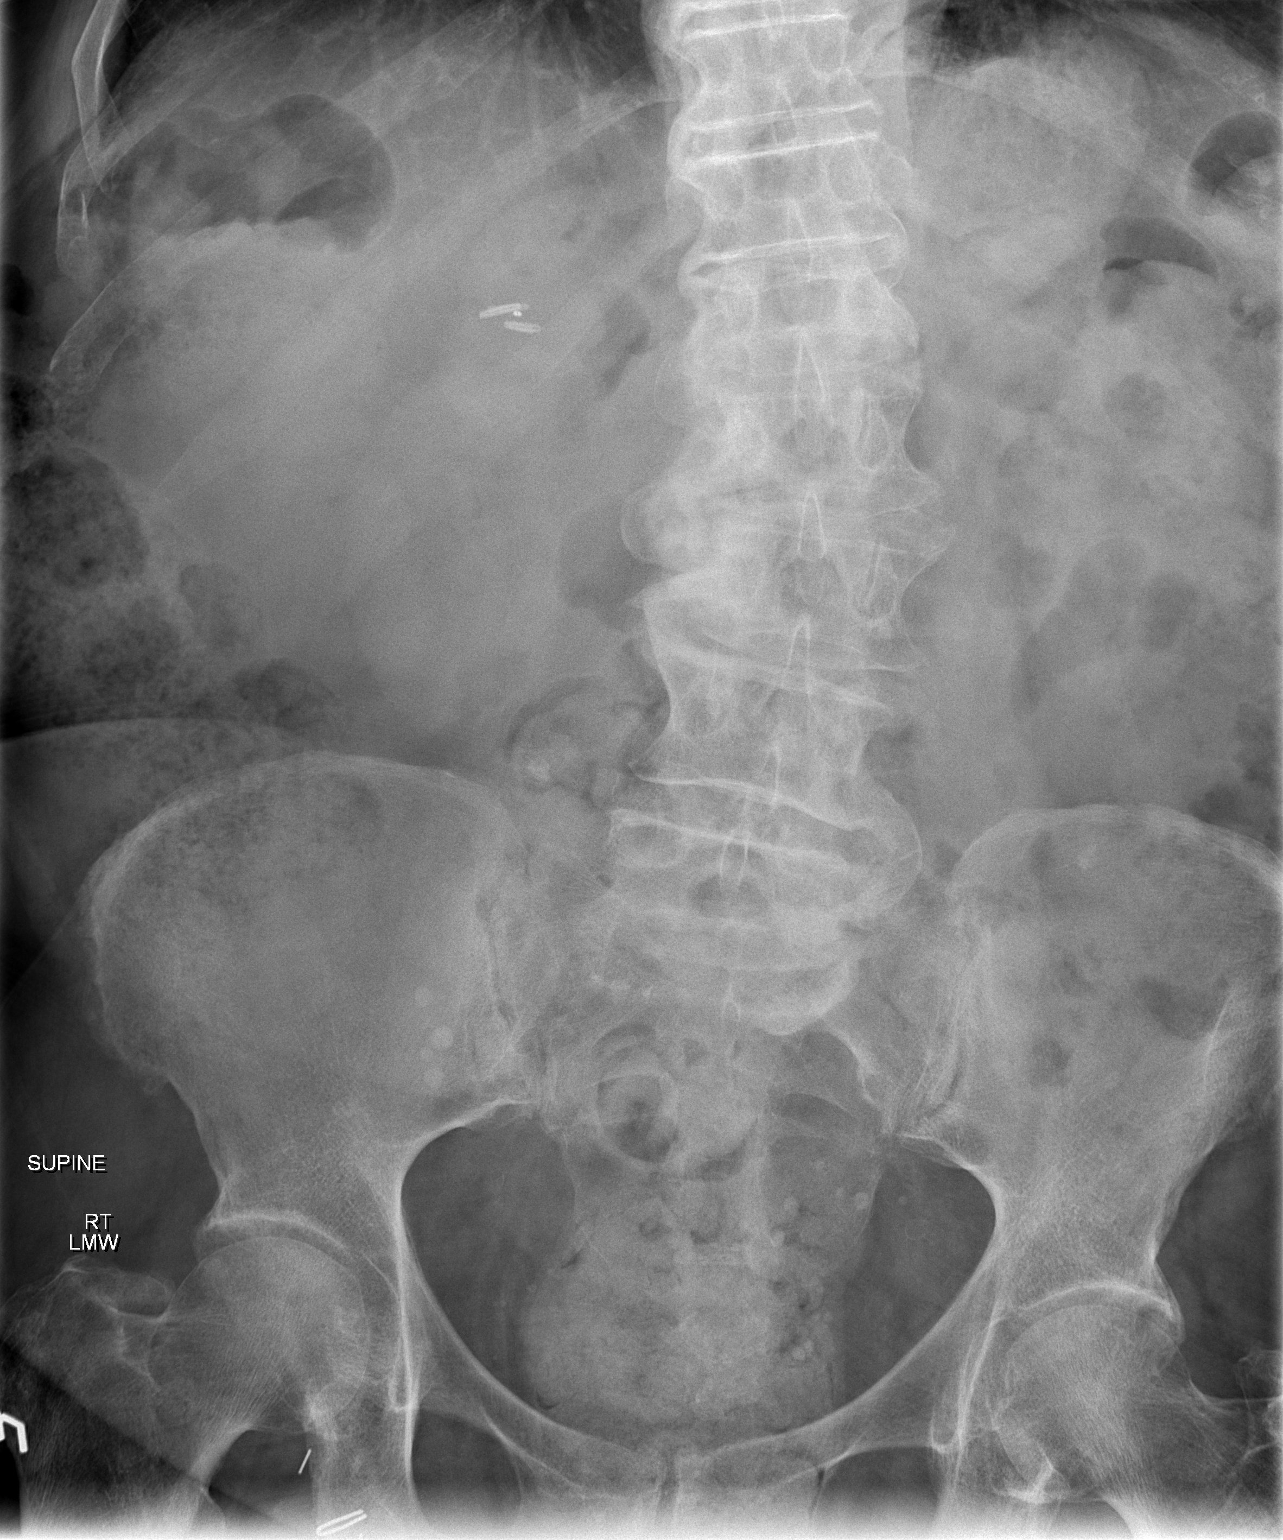

[1 of 1 positions shown; findings below may reference images not displayed]

FINDINGS: The left lateral abdomen and the lung bases are excluded from the
field-of-view. Stool is seen in the ascending colon, descending colon, and
rectum. Air seen within nondilated small bowel. Prior cholecystectomy. There
is a nonspecific small calcification just superior to the right sacrum.
IMPRESSION: Nonobstructed bowel gas pattern.

## 2014-12-17 ENCOUNTER — Non-Acute Institutional Stay (SKILLED_NURSING_FACILITY): Payer: Medicare Other | Admitting: Nurse Practitioner

## 2014-12-17 DIAGNOSIS — R1310 Dysphagia, unspecified: Secondary | ICD-10-CM

## 2014-12-17 DIAGNOSIS — G3183 Dementia with Lewy bodies: Secondary | ICD-10-CM

## 2014-12-17 DIAGNOSIS — R946 Abnormal results of thyroid function studies: Secondary | ICD-10-CM

## 2014-12-17 DIAGNOSIS — G20 Parkinson's disease: Secondary | ICD-10-CM

## 2014-12-17 DIAGNOSIS — E46 Unspecified protein-calorie malnutrition: Secondary | ICD-10-CM

## 2014-12-17 DIAGNOSIS — R339 Retention of urine, unspecified: Secondary | ICD-10-CM | POA: Diagnosis not present

## 2014-12-17 DIAGNOSIS — K219 Gastro-esophageal reflux disease without esophagitis: Secondary | ICD-10-CM | POA: Diagnosis not present

## 2014-12-17 DIAGNOSIS — F0280 Dementia in other diseases classified elsewhere without behavioral disturbance: Secondary | ICD-10-CM

## 2014-12-17 DIAGNOSIS — F0393 Unspecified dementia, unspecified severity, with mood disturbance: Secondary | ICD-10-CM

## 2014-12-17 DIAGNOSIS — K5900 Constipation, unspecified: Secondary | ICD-10-CM | POA: Diagnosis not present

## 2014-12-17 DIAGNOSIS — F329 Major depressive disorder, single episode, unspecified: Secondary | ICD-10-CM

## 2014-12-17 DIAGNOSIS — R7989 Other specified abnormal findings of blood chemistry: Secondary | ICD-10-CM

## 2014-12-17 NOTE — Progress Notes (Signed)
Patient ID: Kristin Frey, female   DOB: 05/21/1930, 81 y.o.   MRN: [id number]    Nursing Home Location:  [place] of Service: SNF (31)  PCP: Blanchie Serve, MD  Allergies  Allergen Reactions  . Penicillins Anaphylaxis  . Fish Allergy Hives    Chief Complaint  Patient presents with  . Medical Management of Chronic Issues    HPI:  Patient is a 81 y.o. female seen today at [facility] for routine follow up on chronic conditions. Pt with pmh of parkinson's disease, dementia, DM, HTN, depression, constipation, urinary rentention with foley catheter. There has been no acute issues in the last month. Staff reports pt conts to eat well with 100% assistance from nursing staff. Pt is total care due to advanced parkinson and dementia. Stage 2 pressure ulcer is unchanged on buttocks. Mood has been stable, without increased anxiety noted. No signs of pain noted by staff.   Review of Systems:  Review of Systems  Unable to perform ROS: Dementia    Past Medical History  Diagnosis Date  . Parkinson disease   . Carotid artery disease   . Hernia   . Retinopathy   . DVT (deep venous thrombosis) 1998  . High cholesterol   . Hypertension   . Hypothyroidism   . Atrial fibrillation   . Cancer 1998    left breast mastectomy chemo and radiation  . Cancer 2014    right breast wide excision   . Diabetes    Past Surgical History  Procedure Laterality Date  . Appendectomy  1949  . Carpal tunnel release Bilateral 1976  . Eye surgery  [phone]  . Cholecystectomy  2007  . Breast surgery Left 1998    mastectomy  . Breast surgery Right 2014    wide excision, sentinel node bx, mastoplasty,MammoSite   Social History:   reports that she has never smoked. She has never used smokeless tobacco. She reports that she does not drink alcohol or use illicit drugs.  Family History  Problem Relation Age of Onset  . Heart attack Mother   . Heart disease  Mother   . Heart disease Brother   . Parkinsonism Brother     Medications: Patient's Medications  New Prescriptions   No medications on file  Previous Medications   ACETAMINOPHEN (TYLENOL) 325 MG TABLET    Take 650 mg by mouth every 6 (six) hours as needed.   CARBIDOPA-LEVODOPA (SINEMET IR) 25-100 MG PER TABLET    Take 1 tablet by mouth 3 (three) times daily.   ESCITALOPRAM (LEXAPRO) 10 MG TABLET    Take 10 mg by mouth daily.    IPRATROPIUM-ALBUTEROL (DUONEB) 0.5-2.5 (3) MG/3ML SOLN    Take 3 mLs by nebulization every 6 (six) hours as needed.   MORPHINE (ROXANOL) 20 MG/ML CONCENTRATED SOLUTION    Take 5 mg by mouth every hour as needed for severe pain.   PRIMIDONE (MYSOLINE) 50 MG TABLET    Take 50 mg by mouth 2 (two) times daily.   QUETIAPINE (SEROQUEL) 25 MG TABLET    Take 25-50 mg by mouth. 25mg  qam and 50mg  QHS   RANITIDINE (ZANTAC) 150 MG TABLET    Take 1 tablet (150 mg total) by mouth 2 (two) times daily.   SENNOSIDES-DOCUSATE SODIUM (SENOKOT-S) 8.6-50 MG TABLET    Take 2 tablets by mouth daily.  Modified Medications   No medications on file  Discontinued Medications   No  medications on file     Physical Exam: Filed Vitals:   12/17/14 1621  BP: 112/72  Pulse: 76  Temp: 98.1 F (36.7 C)  Resp: 20  Weight: 142 lb (64.411 kg)    Physical Exam  Constitutional: She is oriented to person, place, and time. No distress.  Frail elderly female  HENT:  Head: Normocephalic and atraumatic.  Mouth/Throat: Oropharynx is clear and moist. No oropharyngeal exudate.  Eyes: Conjunctivae are normal. Pupils are equal, round, and reactive to light.  Neck: Normal range of motion. Neck supple.  Cardiovascular: Normal rate, regular rhythm and normal heart sounds.   Pulmonary/Chest: Effort normal and breath sounds normal.  Abdominal: Soft. Bowel sounds are normal. She exhibits no distension.  Genitourinary:  Foley catheter in place  Musculoskeletal: She exhibits no edema or tenderness.    Neurological: She is alert and oriented to person, place, and time.  Skin: Skin is warm and dry. She is not diaphoretic.  Stage 2 pressure ulcer to right buttock, DuoDerm applied  Psychiatric: Her affect is blunt. Cognition and memory are impaired. She exhibits abnormal recent memory and abnormal remote memory.  nonverbal    Labs reviewed: Basic Metabolic Panel:  Recent Labs  04/28/14 05/22/14 06/25/14  NA 138 139 136*  K 4.2 3.6 4.5  BUN 25* 21 16  CREATININE 0.6 1.6* 0.9   Liver Function Tests: No results for input(s): AST, ALT, ALKPHOS, BILITOT, PROT, ALBUMIN in the last 8760 hours. No results for input(s): LIPASE, AMYLASE in the last 8760 hours. No results for input(s): AMMONIA in the last 8760 hours. CBC:  Recent Labs  04/28/14 05/22/14  WBC 11.3 8.1  HGB 10.3* 10.9*  HCT 34* 35*  PLT 282 209   TSH: No results for input(s): TSH in the last 8760 hours. A1C: No results found for: HGBA1C Lipid Panel: No results for input(s): CHOL, HDL, LDLCALC, TRIG, CHOLHDL, LDLDIRECT in the last 8760 hours.    Assessment/Plan 1. Dysphagia -conts honey thick liquids, no signs of recent aspiration  2. Depression due to dementia Stable, conts lexapro   3. Parkinson's disease, Lewy body Advanced end stage disease.  Continue sinemet 25/100mg  three times daily with primidone 50mg  daily for tremors and seroquel 25mg  daily in the morning and 50mg  daily at bedtime for agitation and hallucination.  -currently being followed by hospice care  4. Urinary retention -ongoing, with chronic foley catheter   5. Constipation, unspecified constipation type Well controlled on senna 2 tabs daily  6. Protein-calorie malnutrition Weights have been stable, pt with good oral intake Will update labs- will get cbc and cmp  7. GERD conts on zantac twice daily   8. Elevated TSH Will follow up TSH, last TSH slightly elevated at 5  Talasia Saulter K. Harle Battiest  [facility] [phone] 8 am - 5 pm) [phone] (after hours)

## 2014-12-18 LAB — BASIC METABOLIC PANEL
BUN: 19 mg/dL (ref 4–21)
CREATININE: 0.7 mg/dL (ref 0.5–1.1)
GLUCOSE: 82 mg/dL
POTASSIUM: 4.5 mmol/L (ref 3.4–5.3)
SODIUM: 141 mmol/L (ref 137–147)

## 2014-12-18 LAB — CBC AND DIFFERENTIAL
HCT: 33 % — AB (ref 36–46)
Hemoglobin: 10.7 g/dL — AB (ref 12.0–16.0)
Platelets: 302 10*3/uL (ref 150–399)
WBC: 9.7 10*3/mL

## 2014-12-18 LAB — HEPATIC FUNCTION PANEL
ALT: 8 U/L (ref 7–35)
AST: 14 U/L (ref 13–35)
Alkaline Phosphatase: 57 U/L (ref 25–125)
Bilirubin, Total: 0.3 mg/dL

## 2014-12-23 LAB — TSH: TSH: 6.14 u[IU]/mL — AB (ref <=5.90)

## 2015-01-03 DIAGNOSIS — F22 Delusional disorders: Secondary | ICD-10-CM

## 2015-01-03 DIAGNOSIS — F0281 Dementia in other diseases classified elsewhere with behavioral disturbance: Secondary | ICD-10-CM

## 2015-01-03 DIAGNOSIS — G3183 Dementia with Lewy bodies: Secondary | ICD-10-CM

## 2015-01-03 DIAGNOSIS — G20 Parkinson's disease: Secondary | ICD-10-CM | POA: Diagnosis not present

## 2015-01-10 IMAGING — MG MM ADDITIONAL VIEWS AT NO CHARGE
1 series · 4 of 4 positions shown · non-contrast
Comparison: 11/22/2012.

REASON FOR EXAM: av rt focal asymmetry
COMMENTS:

PROCEDURE:     MAM - MAM DGTL [HOSPITAL] ADD VIEWS RT  DIAG  - November 30, 2012 [DATE]
RESULT:

[R CC · right · 4 of 4 slices shown]
[im 1/4]
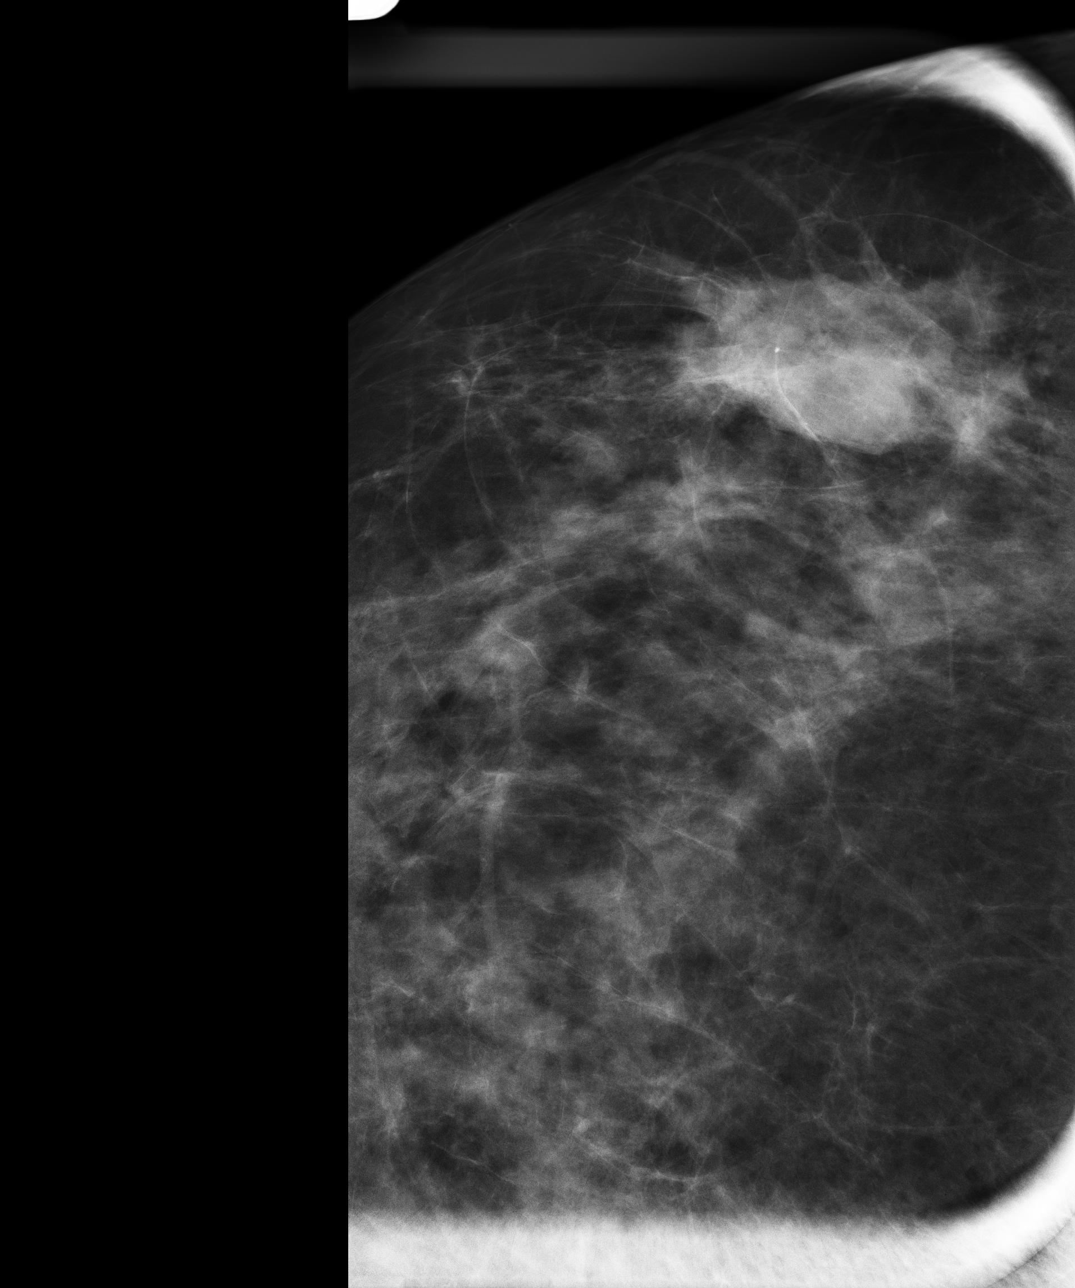
[im 2/4]
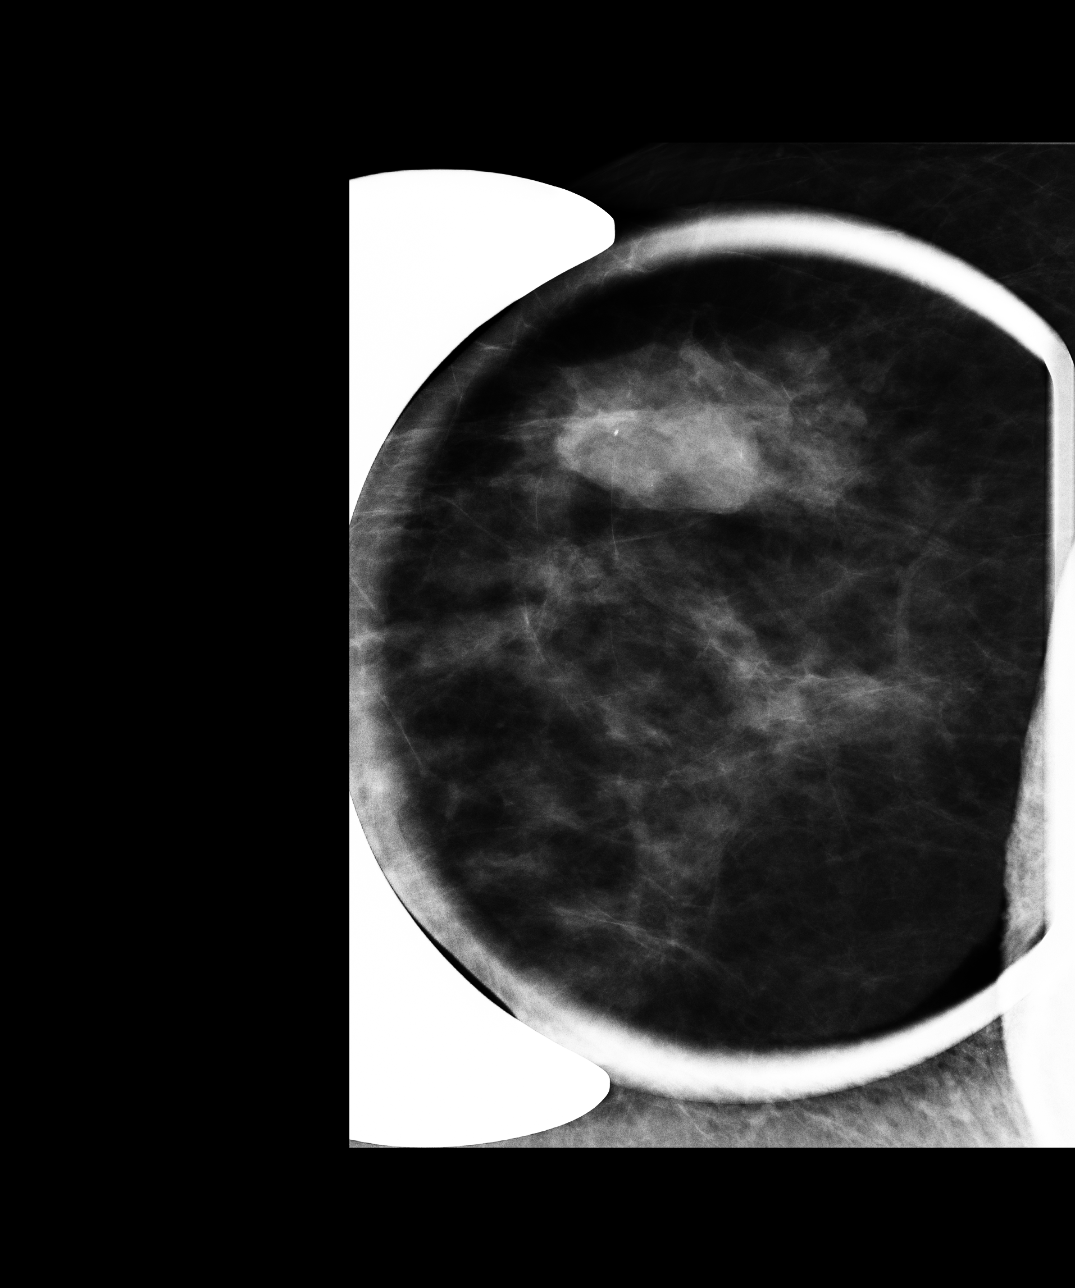
[im 3/4]
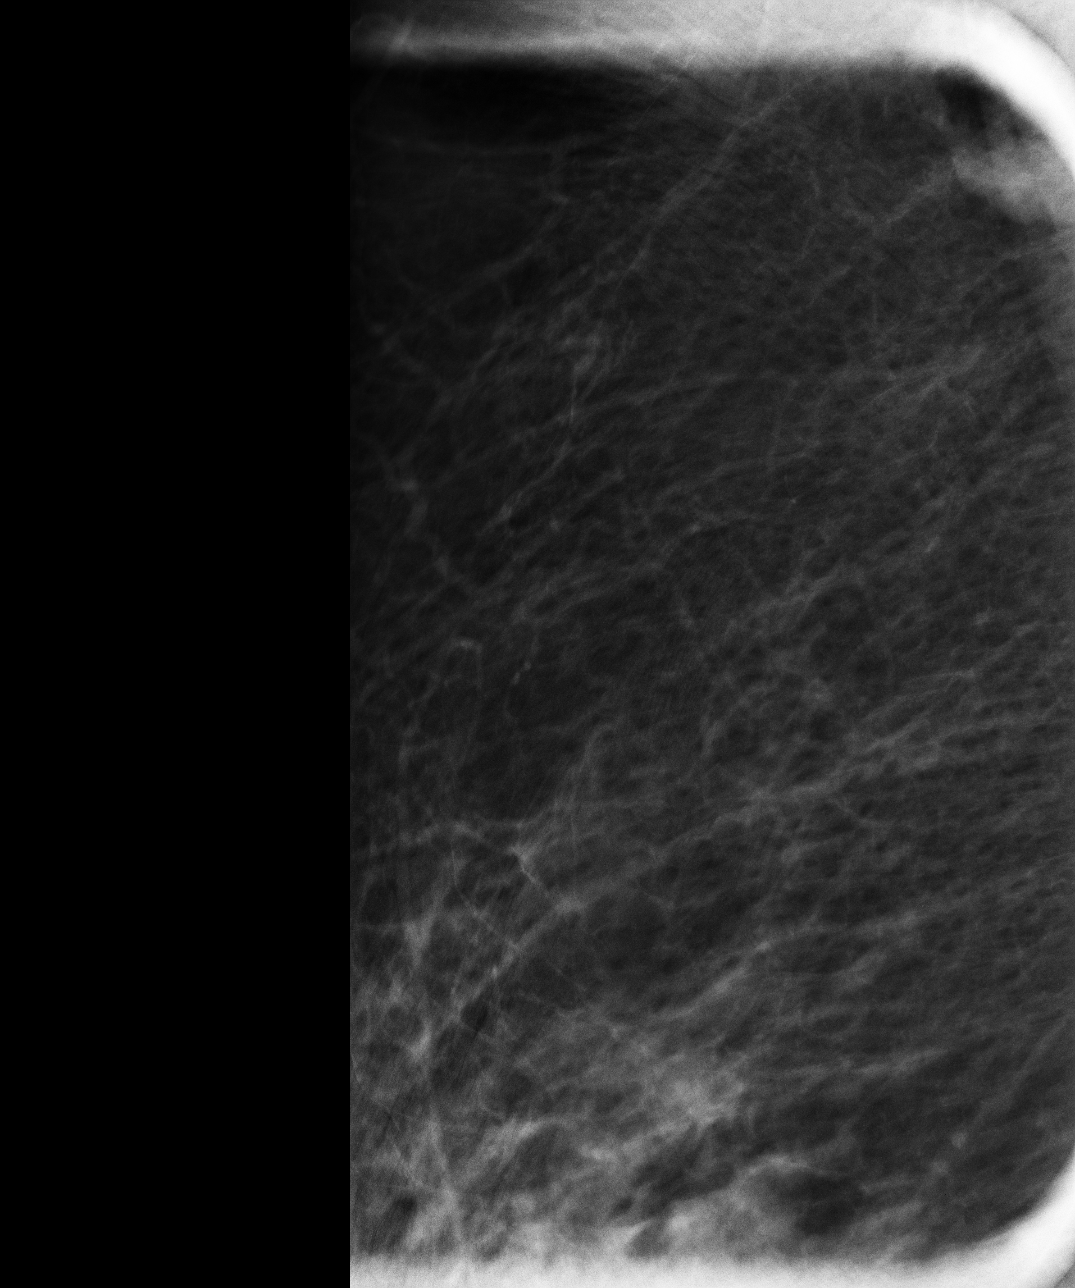
[im 4/4]
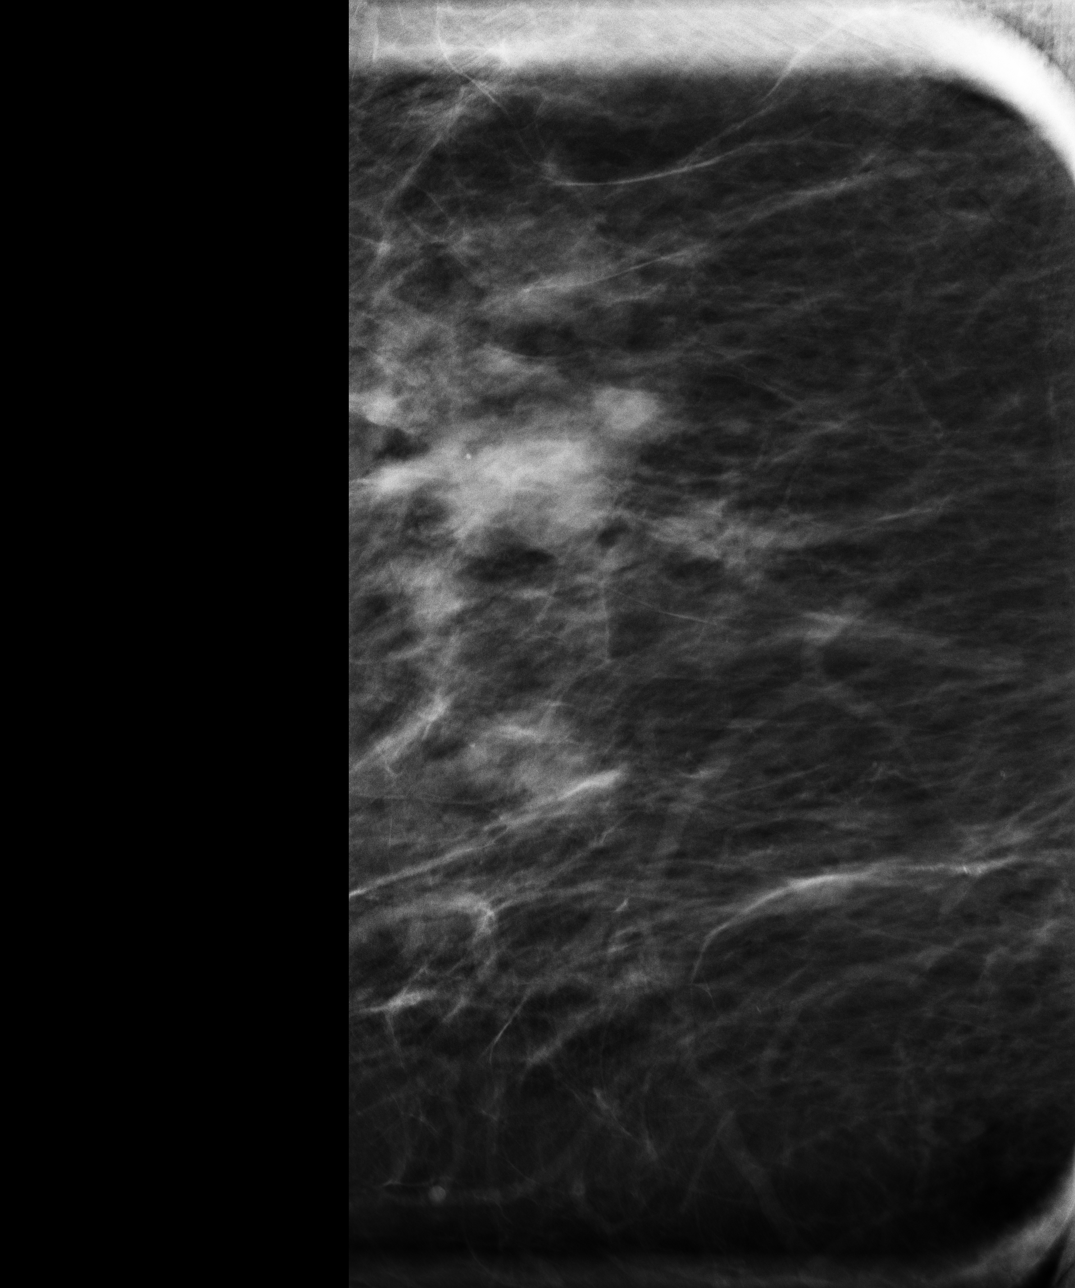

[4 of 4 positions shown; findings below may reference images not displayed]

FINDINGS: Spot compression magnification views were performed to evaluate the findings
in the lateral right breast on the prior mammograms. These images
demonstrate what appears to be a mass in the lateral right breast at
approximately 9 o'clock. Some of the borders are well-circumscribed while
other borders are irregular or obscured by overlapping fibroglandular tissue.

Real time ultrasound was performed of the lateral right breast from 8
o'clock through 10 o'clock. At 9 o'clock there is an irregular hypoechoic
mass. The borders are ill-defined. Exact measurement is difficult to obtain
secondary to their ill-defined borders, but the mass measures approximately
2.9 x 2.9 x 1.9 cm. There is posterior acoustic shadowing.
IMPRESSION: BI-RADS: Category 4 - Suspicious Abnormality.

Surgical consultation and tissue diagnosis are recommended for the irregular
mass in the lateral right breast.

A NEGATIVE MAMMOGRAM REPORT DOES NOT PRECLUDE BIOPSY OR OTHER EVALUATION OF
A CLINICALLY PALPABLE OR OTHERWISE SUSPICIOUS MASS OR LESION. BREAST CA[HOSPITAL]ER
MAY NOT BE DETECTED BY MAMMOGRAPHY IN UP TO 10% OF CASES.

## 2015-01-14 ENCOUNTER — Non-Acute Institutional Stay (SKILLED_NURSING_FACILITY): Payer: Medicare Other | Admitting: Internal Medicine

## 2015-01-14 DIAGNOSIS — F329 Major depressive disorder, single episode, unspecified: Secondary | ICD-10-CM | POA: Diagnosis not present

## 2015-01-14 DIAGNOSIS — F0280 Dementia in other diseases classified elsewhere without behavioral disturbance: Secondary | ICD-10-CM

## 2015-01-14 DIAGNOSIS — E46 Unspecified protein-calorie malnutrition: Secondary | ICD-10-CM | POA: Diagnosis not present

## 2015-01-14 DIAGNOSIS — E039 Hypothyroidism, unspecified: Secondary | ICD-10-CM | POA: Diagnosis not present

## 2015-01-14 DIAGNOSIS — G20 Parkinson's disease: Secondary | ICD-10-CM

## 2015-01-14 DIAGNOSIS — R339 Retention of urine, unspecified: Secondary | ICD-10-CM | POA: Diagnosis not present

## 2015-01-14 DIAGNOSIS — G3183 Dementia with Lewy bodies: Secondary | ICD-10-CM

## 2015-01-14 DIAGNOSIS — F0393 Unspecified dementia, unspecified severity, with mood disturbance: Secondary | ICD-10-CM

## 2015-01-14 DIAGNOSIS — R251 Tremor, unspecified: Secondary | ICD-10-CM | POA: Diagnosis not present

## 2015-01-14 NOTE — Progress Notes (Signed)
Patient ID: Kristin Frey, female   DOB: [birth year], 81 y.o.   MRN: [id number]     Facility: [facility]   Code status: dnr  Chief Complaint  Patient presents with  . Medical Management of Chronic Issues    Allergies  Allergen Reactions  . Penicillins Anaphylaxis  . Fish Allergy Hives   HPI 81 y/o female pt is seen for routine follow up. She is under hospice care. No new concern from pt or staff. She participates minimally in conversation. She has hx of dementia with parkinson's feature, depression, constipation among others. No new behavioral changes reported. Undergoing skin care for her new left forearm tear. Has lost weight.  ROS Unable to obtain  Past Medical History  Diagnosis Date  . Parkinson disease   . Carotid artery disease   . Hernia   . Retinopathy   . DVT (deep venous thrombosis) 1998  . High cholesterol   . Hypertension   . Hypothyroidism   . Atrial fibrillation   . Cancer 1998    left breast mastectomy chemo and radiation  . Cancer 2014    right breast wide excision   . Diabetes      Medication List       This list is accurate as of: 01/14/15  5:57 PM.  Always use your most recent med list.               acetaminophen 325 MG tablet  Commonly known as:  TYLENOL  Take 650 mg by mouth every 6 (six) hours as needed.     carbidopa-levodopa 25-100 MG per tablet  Commonly known as:  SINEMET IR  Take 1 tablet by mouth 3 (three) times daily.     escitalopram 10 MG tablet  Commonly known as:  LEXAPRO  Take 10 mg by mouth daily.     ipratropium-albuterol 0.5-2.5 (3) MG/3ML Soln  Commonly known as:  DUONEB  Take 3 mLs by nebulization every 6 (six) hours as needed.     morphine 20 MG/ML concentrated solution  Commonly known as:  ROXANOL  Take 5 mg by mouth every hour as needed for severe pain.     primidone 50 MG tablet  Commonly known as:  MYSOLINE  Take 50 mg by mouth 2 (two) times daily.     QUEtiapine 25 MG  tablet  Commonly known as:  SEROQUEL  Take 25-50 mg by mouth. 25mg  qam and 50mg  QHS     ranitidine 150 MG tablet  Commonly known as:  ZANTAC  Take 1 tablet (150 mg total) by mouth 2 (two) times daily.     sennosides-docusate sodium 8.6-50 MG tablet  Commonly known as:  SENOKOT-S  Take 2 tablets by mouth daily.       Physical exam BP 112/72 mmHg  Pulse 70  Temp(Src) 98.9 F (37.2 C)  Resp 18  Wt 136 lb 3.2 oz (61.78 kg)  SpO2 99%  Wt Readings from Last 3 Encounters:  01/14/15 136 lb 3.2 oz (61.78 kg)  12/17/14 142 lb (64.411 kg)  10/14/14 145 lb (65.772 kg)   General- elderly female in no acute distress Head- atraumatic, normocephalic Eyes- no pallor, no icterus, no discharge Mouth- normal mucus membrane Cardiovascular- normal s1,s2, no murmurs Respiratory- bilateral clear to auscultation, no wheeze, no rhonchi, no crackles Abdomen- bowel sounds present, soft, non tender, foley in place Musculoskeletal- able to move all 4 extremities, no leg edema Neurological- resting hand tremors Skin- warm and dry,  left forearm skin tear, preventive avelyn dressing to left buttock Psychiatry- alert and calm   Assessment/plan  Protein calorie malnutrition Continue magic cup supplement with pureed diet for now. Has been losing weight and with her dementia,decline anticipated  Lewy body dementia No behavioral disturbance, monitor clinically, continue sinemet, mysoline and seroquel. Continue pressure ulcer prophylaxis and total care. On roxanol prn for comfort measure. Followed by hospice services  Essential tremor Continue her mysoline  Constipation Continue senokot s and monitor  Hypothyroidism Off levothyroxine per family request, monitor clinically  Depression with dementia Continue lexapro and seroquel, monitor clinically  Urinary retention Continue foley, provide foley care  Blanchie Serve, MD  [facility] [phone] (Monday-Friday 8 am - 5  pm) [phone] (afterhours)

## 2015-03-02 ENCOUNTER — Non-Acute Institutional Stay (SKILLED_NURSING_FACILITY): Payer: Medicare Other | Admitting: Nurse Practitioner

## 2015-03-02 DIAGNOSIS — F0393 Unspecified dementia, unspecified severity, with mood disturbance: Secondary | ICD-10-CM

## 2015-03-02 DIAGNOSIS — G3183 Dementia with Lewy bodies: Secondary | ICD-10-CM

## 2015-03-02 DIAGNOSIS — R339 Retention of urine, unspecified: Secondary | ICD-10-CM | POA: Diagnosis not present

## 2015-03-02 DIAGNOSIS — F329 Major depressive disorder, single episode, unspecified: Secondary | ICD-10-CM

## 2015-03-02 DIAGNOSIS — K219 Gastro-esophageal reflux disease without esophagitis: Secondary | ICD-10-CM | POA: Diagnosis not present

## 2015-03-02 DIAGNOSIS — G20 Parkinson's disease: Secondary | ICD-10-CM

## 2015-03-02 DIAGNOSIS — F0280 Dementia in other diseases classified elsewhere without behavioral disturbance: Secondary | ICD-10-CM | POA: Diagnosis not present

## 2015-03-02 DIAGNOSIS — E039 Hypothyroidism, unspecified: Secondary | ICD-10-CM

## 2015-03-02 DIAGNOSIS — R1310 Dysphagia, unspecified: Secondary | ICD-10-CM | POA: Diagnosis not present

## 2015-03-02 MED ORDER — LEVOTHYROXINE SODIUM 25 MCG PO TABS: 25.0000 ug | ORAL_TABLET | Freq: Every day | ORAL | Status: DC

## 2015-03-02 NOTE — Progress Notes (Signed)
Patient ID: Kristin Frey, female   DOB: [birth year], 81 y.o.   MRN: [id number]    Nursing Home Location:  [place] of Service: SNF (31)  PCP: Blanchie Serve, MD  Allergies  Allergen Reactions  . Penicillins Anaphylaxis  . Fish Allergy Hives    Chief Complaint  Patient presents with  . Medical Management of Chronic Issues    HPI:  Patient is a 81 y.o. female seen today at [facility] Rehab for medical management of her chronic issues.  She has a past medical history of dementia, Parkinson's disease, depression and CAD.  She is under Hospice care due to advanced dementia with parkinson's disease.  No concerns from nursing. There has been no acute issues in the last month.    Review of Systems: Unable to attain due to dementia Review of Systems  Unable to perform ROS: Dementia    Past Medical History  Diagnosis Date  . Parkinson disease   . Carotid artery disease   . Hernia   . Retinopathy   . DVT (deep venous thrombosis) 1998  . High cholesterol   . Hypertension   . Hypothyroidism   . Atrial fibrillation   . Cancer 1998    left breast mastectomy chemo and radiation  . Cancer 2014    right breast wide excision   . Diabetes    Past Surgical History  Procedure Laterality Date  . Appendectomy  1949  . Carpal tunnel release Bilateral 1976  . Eye surgery  [phone]  . Cholecystectomy  2007  . Breast surgery Left 1998    mastectomy  . Breast surgery Right 2014    wide excision, sentinel node bx, mastoplasty,MammoSite   Social History:   reports that she has never smoked. She has never used smokeless tobacco. She reports that she does not drink alcohol or use illicit drugs.  Family History  Problem Relation Age of Onset  . Heart attack Mother   . Heart disease Mother   . Heart disease Brother   . Parkinsonism Brother     Medications: Patient's Medications  New Prescriptions   LEVOTHYROXINE (LEVOTHROID) 25 MCG TABLET     Take 1 tablet (25 mcg total) by mouth daily before breakfast.  Previous Medications   ACETAMINOPHEN (TYLENOL) 325 MG TABLET    Take 650 mg by mouth every 6 (six) hours as needed.   CARBIDOPA-LEVODOPA (SINEMET IR) 25-100 MG PER TABLET    Take 1 tablet by mouth 3 (three) times daily.   ESCITALOPRAM (LEXAPRO) 10 MG TABLET    Take 10 mg by mouth daily.    IPRATROPIUM-ALBUTEROL (DUONEB) 0.5-2.5 (3) MG/3ML SOLN    Take 3 mLs by nebulization every 6 (six) hours as needed.   MORPHINE (ROXANOL) 20 MG/ML CONCENTRATED SOLUTION    Take 5 mg by mouth every hour as needed for severe pain.   PRIMIDONE (MYSOLINE) 50 MG TABLET    Take 50 mg by mouth 2 (two) times daily.   QUETIAPINE (SEROQUEL) 25 MG TABLET    Take 25-50 mg by mouth. 25mg  qam and 50mg  QHS   RANITIDINE (ZANTAC) 150 MG TABLET    Take 1 tablet (150 mg total) by mouth 2 (two) times daily.   SENNOSIDES-DOCUSATE SODIUM (SENOKOT-S) 8.6-50 MG TABLET    Take 2 tablets by mouth daily.  Modified Medications   No medications on file  Discontinued Medications   No medications on file     Physical Exam:  Filed Vitals:   03/02/15 1154  BP: 107/64  Pulse: 63  Temp: 97.3 F (36.3 C)  TempSrc: Oral  Resp: 16  Weight: 136 lb 11.2 oz (62.007 kg)    Physical Exam  Constitutional: She appears lethargic.  Frail elderly female  HENT:  Head: Normocephalic and atraumatic.  Mouth/Throat: Oropharynx is clear and moist.  Eyes: Pupils are equal, round, and reactive to light.  Neck: Normal range of motion. Neck supple.  Cardiovascular: Normal rate, regular rhythm and normal heart sounds.   Pulmonary/Chest: Effort normal and breath sounds normal.  Abdominal: Soft. Bowel sounds are normal. She exhibits no distension.  Musculoskeletal:  Unable to assess.  Lymphadenopathy:    She has no cervical adenopathy.  Neurological: She appears lethargic. She is disoriented.  Skin: Skin is warm and dry.    Labs reviewed: Basic Metabolic Panel:  Recent Labs   05/22/14 06/25/14 12/18/14 1016  NA 139 136* 141  K 3.6 4.5 4.5  BUN 21 16 19   CREATININE 1.6* 0.9 0.7   Liver Function Tests:  Recent Labs  12/18/14 1016  AST 14  ALT 8  ALKPHOS 57   No results for input(s): LIPASE, AMYLASE in the last 8760 hours. No results for input(s): AMMONIA in the last 8760 hours. CBC:  Recent Labs  04/28/14 05/22/14 12/18/14 1016  WBC 11.3 8.1 9.7  HGB 10.3* 10.9* 10.7*  HCT 34* 35* 33*  PLT 282 209 302   TSH:  Recent Labs  12/23/14 1240  TSH 6.14*   A1C: No results found for: HGBA1C Lipid Panel: No results for input(s): CHOL, HDL, LDLCALC, TRIG, CHOLHDL, LDLDIRECT in the last 8760 hours.  Assessment/Plan 1. Dysphagia Stable.  Continue on honey thick liquids and pureed diet.  Staff continues to assist patient with feeding.    2. Lewy body Parkinson disease Advanced.  Continue Sinemet and primidone for tremors.  Continue total assist with ADL's.    3. Urinary retention Stable.  Uses foley catheter.    4. Gastroesophageal reflux disease, esophagitis presence not specified Stable.  Continue Zantac.    5. Depression due to dementia Mood stable.  Continue lexapro for depression.    6. Hypothyroid TSH 6.41 on July 12th. Levothyroxine was started at 25 mcg. Will follow up [place]. Harle Battiest  [facility] [phone] 8 am - 5 pm) [phone] (after hours)

## 2015-03-04 DIAGNOSIS — F0281 Dementia in other diseases classified elsewhere with behavioral disturbance: Secondary | ICD-10-CM | POA: Diagnosis not present

## 2015-03-04 DIAGNOSIS — G20 Parkinson's disease: Secondary | ICD-10-CM | POA: Diagnosis not present

## 2015-03-04 DIAGNOSIS — F22 Delusional disorders: Secondary | ICD-10-CM | POA: Diagnosis not present

## 2015-03-04 DIAGNOSIS — G3183 Dementia with Lewy bodies: Secondary | ICD-10-CM | POA: Diagnosis not present

## 2015-03-18 LAB — TSH: TSH: 5.4 u[IU]/mL (ref <=5.90)

## 2015-04-13 ENCOUNTER — Encounter: Payer: Self-pay | Admitting: Nurse Practitioner

## 2015-04-13 ENCOUNTER — Non-Acute Institutional Stay (SKILLED_NURSING_FACILITY): Payer: Medicare Other | Admitting: Nurse Practitioner

## 2015-04-13 DIAGNOSIS — G3183 Dementia with Lewy bodies: Secondary | ICD-10-CM

## 2015-04-13 DIAGNOSIS — L89322 Pressure ulcer of left buttock, stage 2: Secondary | ICD-10-CM | POA: Diagnosis not present

## 2015-04-13 DIAGNOSIS — R1310 Dysphagia, unspecified: Secondary | ICD-10-CM | POA: Diagnosis not present

## 2015-04-13 DIAGNOSIS — R339 Retention of urine, unspecified: Secondary | ICD-10-CM | POA: Diagnosis not present

## 2015-04-13 DIAGNOSIS — E039 Hypothyroidism, unspecified: Secondary | ICD-10-CM

## 2015-04-13 DIAGNOSIS — F0280 Dementia in other diseases classified elsewhere without behavioral disturbance: Secondary | ICD-10-CM

## 2015-04-13 NOTE — Progress Notes (Signed)
Patient ID: Kristin Frey, female   DOB: 1929-08-29, 81 y.o.   MRN: [id number]    Nursing Home Location:  [place] of Service: SNF (31)  PCP: Blanchie Serve, MD  Allergies  Allergen Reactions  . Penicillins Anaphylaxis  . Fish Allergy Hives    Chief Complaint  Patient presents with  . Medical Management of Chronic Issues    Routine Visit     HPI:  Patient is a 81 y.o. female seen today at [facility] and Rehab for routine follow up. She has a past medical history of dementia, Parkinson's disease, depression and CAD. She is under Hospice care due to advanced dementia with parkinson's disease. pt was started on synthroid due to elevated TSH, TSH improved. pt with weight loss noted over the last month. conts to have staff assist with feeding due to advanced dementia. Overall there has been no acute changes in the last month, staff without concerns.  Review of Systems:  Review of Systems  Unable to perform ROS: Dementia    Past Medical History  Diagnosis Date  . Parkinson disease ([place])   . Carotid artery disease ([place])   . Hernia   . Retinopathy   . DVT (deep venous thrombosis) ([place]) 1998  . High cholesterol   . Hypertension   . Hypothyroidism   . Atrial fibrillation ([place])   . Cancer [facility]) 1998    left breast mastectomy chemo and radiation  . Cancer [facility]) 2014    right breast wide excision   . Diabetes [facility])    Past Surgical History  Procedure Laterality Date  . Appendectomy  1949  . Carpal tunnel release Bilateral 1976  . Eye surgery  [phone]  . Cholecystectomy  2007  . Breast surgery Left 1998    mastectomy  . Breast surgery Right 2014    wide excision, sentinel node bx, mastoplasty,MammoSite   Social History:   reports that she has never smoked. She has never used smokeless tobacco. She reports that she does not drink alcohol or use illicit drugs.  Family History  Problem Relation Age of Onset  . Heart attack Mother     . Heart disease Mother   . Heart disease Brother   . Parkinsonism Brother     Medications: Patient's Medications  New Prescriptions   No medications on file  Previous Medications   ACETAMINOPHEN (TYLENOL) 325 MG TABLET    Take 650 mg by mouth every 6 (six) hours as needed.   CARBIDOPA-LEVODOPA (SINEMET IR) 25-100 MG PER TABLET    Take 1 tablet by mouth 3 (three) times daily.   ESCITALOPRAM (LEXAPRO) 10 MG TABLET    Take 10 mg by mouth daily.    IPRATROPIUM-ALBUTEROL (DUONEB) 0.5-2.5 (3) MG/3ML SOLN    Take 3 mLs by nebulization every 6 (six) hours as needed.   LEVOTHYROXINE (LEVOTHROID) 25 MCG TABLET    Take 1 tablet (25 mcg total) by mouth daily before breakfast.   MORPHINE (ROXANOL) 20 MG/ML CONCENTRATED SOLUTION    0.25 mL by mouth every 1 hour as needed for SOB/Pain   PRIMIDONE (MYSOLINE) 50 MG TABLET    Take 50 mg by mouth 2 (two) times daily.   QUETIAPINE (SEROQUEL) 25 MG TABLET    Take 25-50 mg by mouth. 25mg  qam and 50mg  QHS   RANITIDINE (ZANTAC) 150 MG TABLET    Take 1 tablet (150 mg total) by mouth 2 (two) times daily.   SENNOSIDES-DOCUSATE SODIUM (  SENOKOT-S) 8.6-50 MG TABLET    Take 2 tablets by mouth at bedtime.    UNABLE TO FIND    Medpass 120 mL honey thick and liquid of choice three times daily   UNABLE TO FIND    Magic Cup at lunch and supper related to weight loss/wound  Modified Medications   No medications on file  Discontinued Medications   No medications on file     Physical Exam: Filed Vitals:   04/13/15 1609  BP: 122/71  Pulse: 68  Temp: 98.9 F (37.2 C)  TempSrc: Oral  Resp: 16  Height: 5\' 3"  (1.6 m)  Weight: 133 lb (60.328 kg)  SpO2: 97%    Physical Exam  Constitutional:  Frail elderly female  HENT:  Head: Normocephalic and atraumatic.  Mouth/Throat: Oropharynx is clear and moist.  Eyes: Pupils are equal, round, and reactive to light.  Neck: Normal range of motion. Neck supple.  Cardiovascular: Normal rate, regular rhythm and normal heart  sounds.   Pulmonary/Chest: Effort normal and breath sounds normal.  Abdominal: Soft. Bowel sounds are normal. She exhibits no distension.  Musculoskeletal: She exhibits no edema.  Remains in bed  Lymphadenopathy:    She has no cervical adenopathy.  Neurological: She is disoriented.  Skin: Skin is warm and dry.    Labs reviewed: Basic Metabolic Panel:  Recent Labs  05/22/14 06/25/14 12/18/14 1016  NA 139 136* 141  K 3.6 4.5 4.5  BUN 21 16 19   CREATININE 1.6* 0.9 0.7   Liver Function Tests:  Recent Labs  12/18/14 1016  AST 14  ALT 8  ALKPHOS 57   No results for input(s): LIPASE, AMYLASE in the last 8760 hours. No results for input(s): AMMONIA in the last 8760 hours. CBC:  Recent Labs  04/28/14 05/22/14 12/18/14 1016  WBC 11.3 8.1 9.7  HGB 10.3* 10.9* 10.7*  HCT 34* 35* 33*  PLT 282 209 302   TSH:  Recent Labs  12/23/14 1240 03/18/15  TSH 6.14* 5.40   A1C: No results found for: HGBA1C Lipid Panel: No results for input(s): CHOL, HDL, LDLCALC, TRIG, CHOLHDL, LDLDIRECT in the last 8760 hours.   Assessment/Plan 1. Hypothyroidism, unspecified hypothyroidism type TSH at goal, conts on synthroid 25 mcg  2. Urinary retention Stable with chronic foley catheter.   3. Dysphagia Stable on honey thick liquids and pureed diet. Due to advanced dementia staff assist pt with eating.  4. Lewy body Parkinson disease ([place]) Advanced disease, no acute decline in the last month.  5. Pressure ulcer of left buttock, stage 2 Stable, conts to be follow up wound care nurse and pressure reduction    Martinique Pizzimenti K. Harle Battiest  [facility] [phone] 8 am - 5 pm) [phone] (after hours)

## 2015-05-03 DIAGNOSIS — F0281 Dementia in other diseases classified elsewhere with behavioral disturbance: Secondary | ICD-10-CM | POA: Diagnosis not present

## 2015-05-03 DIAGNOSIS — G20 Parkinson's disease: Secondary | ICD-10-CM

## 2015-05-03 DIAGNOSIS — G3183 Dementia with Lewy bodies: Secondary | ICD-10-CM | POA: Diagnosis not present

## 2015-05-03 DIAGNOSIS — F22 Delusional disorders: Secondary | ICD-10-CM | POA: Diagnosis not present

## 2015-06-01 ENCOUNTER — Non-Acute Institutional Stay (SKILLED_NURSING_FACILITY): Payer: Medicare Other | Admitting: Nurse Practitioner

## 2015-06-01 DIAGNOSIS — F0393 Unspecified dementia, unspecified severity, with mood disturbance: Secondary | ICD-10-CM

## 2015-06-01 DIAGNOSIS — R1310 Dysphagia, unspecified: Secondary | ICD-10-CM | POA: Diagnosis not present

## 2015-06-01 DIAGNOSIS — G3183 Dementia with Lewy bodies: Secondary | ICD-10-CM | POA: Diagnosis not present

## 2015-06-01 DIAGNOSIS — F0280 Dementia in other diseases classified elsewhere without behavioral disturbance: Secondary | ICD-10-CM

## 2015-06-01 DIAGNOSIS — R339 Retention of urine, unspecified: Secondary | ICD-10-CM

## 2015-06-01 DIAGNOSIS — F329 Major depressive disorder, single episode, unspecified: Secondary | ICD-10-CM

## 2015-06-01 DIAGNOSIS — K219 Gastro-esophageal reflux disease without esophagitis: Secondary | ICD-10-CM | POA: Diagnosis not present

## 2015-06-01 NOTE — Progress Notes (Signed)
Patient ID: Kristin Frey, female   DOB: 1929-12-09, 81 y.o.   MRN: [id number]    Nursing Home Location:  [place] of Service: SNF (31)  PCP: Blanchie Serve, MD  Allergies  Allergen Reactions  . Penicillins Anaphylaxis  . Fish Allergy Hives    Chief Complaint  Patient presents with  . Medical Management of Chronic Issues    HPI:  Patient is a 81 y.o. female seen today at [facility] and Rehab for routine follow up. She has a past medical history of dementia, Parkinson's disease, depression and CAD. She is under Hospice care due to advanced dementia with parkinson's disease. weight has been stable in the last month. No signs of increased discomfort conts with foley catheter. No new skin issues.  Review of Systems:  Review of Systems  Unable to perform ROS: Dementia    Past Medical History  Diagnosis Date  . Parkinson disease ([place])   . Carotid artery disease ([place])   . Hernia   . Retinopathy   . DVT (deep venous thrombosis) ([place]) 1998  . High cholesterol   . Hypertension   . Hypothyroidism   . Atrial fibrillation ([place])   . Cancer [facility]) 1998    left breast mastectomy chemo and radiation  . Cancer [facility]) 2014    right breast wide excision   . Diabetes [facility])    Past Surgical History  Procedure Laterality Date  . Appendectomy  1949  . Carpal tunnel release Bilateral 1976  . Eye surgery  [phone]  . Cholecystectomy  2007  . Breast surgery Left 1998    mastectomy  . Breast surgery Right 2014    wide excision, sentinel node bx, mastoplasty,MammoSite   Social History:   reports that she has never smoked. She has never used smokeless tobacco. She reports that she does not drink alcohol or use illicit drugs.  Family History  Problem Relation Age of Onset  . Heart attack Mother   . Heart disease Mother   . Heart disease Brother   . Parkinsonism Brother     Medications: Patient's Medications  New Prescriptions   No  medications on file  Previous Medications   ACETAMINOPHEN (TYLENOL) 325 MG TABLET    Take 650 mg by mouth every 6 (six) hours as needed.   CARBIDOPA-LEVODOPA (SINEMET IR) 25-100 MG PER TABLET    Take 1 tablet by mouth 3 (three) times daily.   ESCITALOPRAM (LEXAPRO) 10 MG TABLET    Take 10 mg by mouth daily.    IPRATROPIUM-ALBUTEROL (DUONEB) 0.5-2.5 (3) MG/3ML SOLN    Take 3 mLs by nebulization every 6 (six) hours as needed.   LEVOTHYROXINE (LEVOTHROID) 25 MCG TABLET    Take 1 tablet (25 mcg total) by mouth daily before breakfast.   MORPHINE (ROXANOL) 20 MG/ML CONCENTRATED SOLUTION    0.25 mL by mouth every 1 hour as needed for SOB/Pain   PRIMIDONE (MYSOLINE) 50 MG TABLET    Take 50 mg by mouth 2 (two) times daily.   QUETIAPINE (SEROQUEL) 25 MG TABLET    Take 25-50 mg by mouth. 25mg  qam and 50mg  QHS   RANITIDINE (ZANTAC) 150 MG TABLET    Take 1 tablet (150 mg total) by mouth 2 (two) times daily.   SENNOSIDES-DOCUSATE SODIUM (SENOKOT-S) 8.6-50 MG TABLET    Take 2 tablets by mouth at bedtime.    UNABLE TO FIND    Medpass 120 mL honey thick and liquid of  choice three times daily   UNABLE TO FIND    Magic Cup at lunch and supper related to weight loss/wound  Modified Medications   No medications on file  Discontinued Medications   No medications on file     Physical Exam: Filed Vitals:   06/01/15 1708  BP: 104/57  Pulse: 70  Temp: 97.4 F (36.3 C)  Resp: 20  Weight: 132 lb (59.875 kg)    Physical Exam  Constitutional:  Frail elderly female  HENT:  Head: Normocephalic and atraumatic.  Mouth/Throat: Oropharynx is clear and moist.  Eyes: Pupils are equal, round, and reactive to light.  Neck: Normal range of motion. Neck supple.  Cardiovascular: Normal rate, regular rhythm and normal heart sounds.   Pulmonary/Chest: Effort normal and breath sounds normal.  Abdominal: Soft. Bowel sounds are normal. She exhibits no distension.  Musculoskeletal: She exhibits no edema.  Remains in bed    Lymphadenopathy:    She has no cervical adenopathy.  Neurological: She is disoriented.  Skin: Skin is warm and dry.    Labs reviewed: Basic Metabolic Panel:  Recent Labs  06/25/14 12/18/14 1016  NA 136* 141  K 4.5 4.5  BUN 16 19  CREATININE 0.9 0.7   Liver Function Tests:  Recent Labs  12/18/14 1016  AST 14  ALT 8  ALKPHOS 57   No results for input(s): LIPASE, AMYLASE in the last 8760 hours. No results for input(s): AMMONIA in the last 8760 hours. CBC:  Recent Labs  12/18/14 1016  WBC 9.7  HGB 10.7*  HCT 33*  PLT 302   TSH:  Recent Labs  12/23/14 1240 03/18/15  TSH 6.14* 5.40   A1C: No results found for: HGBA1C Lipid Panel: No results for input(s): CHOL, HDL, LDLCALC, TRIG, CHOLHDL, LDLDIRECT in the last 8760 hours.   Assessment/Plan  1. Gastroesophageal reflux disease, esophagitis presence not specified Stable, conts on zantac 150 mg daily  2. Lewy body Parkinson disease ([place]) Advanced, symptoms controlled on current regimen. conts on mysoline BID and sinemet IR TID -conts on supplements for nutritional support 3. Depression due to dementia -remains stable, conts on lexapro 10 mg dialy  4. Dysphagia -stable, no signs of aspiration, conts on precautions and honey thick liquids with puree diet  5. Urinary retention Stable, remains with foley catheter, staff to cont foley care   Trinton Prewitt K. Harle Battiest  [facility] [phone] 8 am - 5 pm) [phone] (after hours)

## 2015-06-19 ENCOUNTER — Other Ambulatory Visit: Payer: Self-pay | Admitting: *Deleted

## 2015-06-19 MED ORDER — AMBULATORY NON FORMULARY MEDICATION
Status: DC
Start: 2015-06-19 — End: 2015-09-23

## 2015-06-19 NOTE — Telephone Encounter (Signed)
[facility] 

## 2015-07-02 DIAGNOSIS — F0281 Dementia in other diseases classified elsewhere with behavioral disturbance: Secondary | ICD-10-CM | POA: Diagnosis not present

## 2015-07-02 DIAGNOSIS — F22 Delusional disorders: Secondary | ICD-10-CM | POA: Diagnosis not present

## 2015-07-02 DIAGNOSIS — G3183 Dementia with Lewy bodies: Secondary | ICD-10-CM

## 2015-07-02 DIAGNOSIS — G20 Parkinson's disease: Secondary | ICD-10-CM

## 2015-07-08 ENCOUNTER — Non-Acute Institutional Stay (SKILLED_NURSING_FACILITY): Payer: Medicare Other | Admitting: Family

## 2015-07-08 DIAGNOSIS — E46 Unspecified protein-calorie malnutrition: Secondary | ICD-10-CM | POA: Diagnosis not present

## 2015-07-08 DIAGNOSIS — K219 Gastro-esophageal reflux disease without esophagitis: Secondary | ICD-10-CM | POA: Diagnosis not present

## 2015-07-08 DIAGNOSIS — R339 Retention of urine, unspecified: Secondary | ICD-10-CM | POA: Diagnosis not present

## 2015-07-08 DIAGNOSIS — K5900 Constipation, unspecified: Secondary | ICD-10-CM | POA: Diagnosis not present

## 2015-07-08 NOTE — Progress Notes (Signed)
Patient ID: Kristin Frey, female   DOB: 25-Feb-1930, 82 y.o.   MRN: [id number]  Location: [facility]  Provider:  Blanchie Serve, MD  Code Status: DNR Goals of care: Advanced Directive information Advanced Directives 07/08/2015  Does patient have an advance directive? Yes  Type of Paramedic of [place];Out of facility DNR (pink MOST or yellow form)  Does patient want to make changes to advanced directive? No - Patient declined  Copy of advanced directive(s) in chart? Yes  Pre-existing out of facility DNR order (yellow form or pink MOST form) -     Chief Complaint  Patient presents with   Medical Management of Chronic Issues    HPI:  Pt is a 82 y.o. female seen today at [facility] Rehabilitation for medical management of chronic diseases.She has past medical History of GERD, Depression, Parkinson's disease, Lewy body, dysphagia, constipation, urinary retention, Protein-Calorie Malnutrition among others. Seen today in her room.Patient's history limited due to presence of dementia. Additional information provided by facility staff.       Review of Systems  Unable to perform ROS: dementia    Past Medical History  Diagnosis Date   Parkinson disease ([place])    Carotid artery disease ([place])    Hernia    Retinopathy    DVT (deep venous thrombosis) ([place]) 1998   High cholesterol    Hypertension    Hypothyroidism    Atrial fibrillation ([place])    Cancer ([place]) 1998    left breast mastectomy chemo and radiation   Cancer ([place]) 2014    right breast wide excision    Diabetes ([place])    Past Surgical History  Procedure Laterality Date   Appendectomy  1949   Carpal tunnel release Bilateral 1976   Eye surgery  1985-1986   Cholecystectomy  2007   Breast surgery Left 1998    mastectomy   Breast surgery Right 2014    wide excision, sentinel node bx, mastoplasty,MammoSite    Allergies  Allergen Reactions    Penicillins Anaphylaxis   Fish Allergy Hives      Medication List       This list is accurate as of: 07/08/15  4:39 PM.  Always use your most recent med list.               acetaminophen 325 MG tablet  Commonly known as:  TYLENOL  Take 650 mg by mouth every 6 (six) hours as needed.     AMBULATORY NON FORMULARY MEDICATION  Morphine Sul sol 100/72ml Sig: Administer 0.69ml by mouth every 1 hour as needed for shortness of breath/pain     carbidopa-levodopa 25-100 MG tablet  Commonly known as:  SINEMET IR  Take 1 tablet by mouth 3 (three) times daily.     escitalopram 10 MG tablet  Commonly known as:  LEXAPRO  Take 10 mg by mouth daily.     ipratropium-albuterol 0.5-2.5 (3) MG/3ML Soln  Commonly known as:  DUONEB  Take 3 mLs by nebulization every 6 (six) hours as needed.     morphine 20 MG/ML concentrated solution  Commonly known as:  ROXANOL  0.25 mL by mouth every 1 hour as needed for SOB/Pain     primidone 50 MG tablet  Commonly known as:  MYSOLINE  Take 50 mg by mouth 2 (two) times daily.     QUEtiapine 25 MG tablet  Commonly known as:  SEROQUEL  Take 25-50 mg by mouth. 25mg  qam  and 50mg  QHS     ranitidine 150 MG tablet  Commonly known as:  ZANTAC  Take 1 tablet (150 mg total) by mouth 2 (two) times daily.     sennosides-docusate sodium 8.6-50 MG tablet  Commonly known as:  SENOKOT-S  Take 2 tablets by mouth at bedtime.     UNABLE TO FIND  Medpass 120 mL honey thick and liquid of choice three times daily     UNABLE TO FIND  Magic Cup at lunch and supper related to weight loss/wound        Immunization History  Administered Date(s) Administered   Influenza-Unspecified 03/24/2014, 04/01/2015   PPD Test 03/20/2014, 03/20/2014, 04/06/2014, 04/06/2014, 02/18/2015, 02/18/2015   Pneumococcal-Unspecified 03/19/2014   Pertinent  Health Maintenance Due  Topic Date Due   DEXA SCAN  10/18/1996   PNA vac Low Risk Adult (2 of 2 - PCV13) 03/20/2015    INFLUENZA VACCINE  01/12/2016   No flowsheet data found.  Filed Vitals:   07/08/15 1628  BP: 131/53  Pulse: 78  Temp: 98.1 F (36.7 C)  Resp: 18  Weight: 133 lb (60.328 kg)  SpO2: 94%   Body mass index is 23.57 kg/(m^2). Physical Exam  Constitutional:  Obese, in no acute distress.   HENT:  Head: Normocephalic.  Right Ear: External ear normal.  Left Ear: External ear normal.  Eyes: EOM are normal. Pupils are equal, round, and reactive to light. Right eye exhibits no discharge. Left eye exhibits no discharge. No scleral icterus.  Neck: Normal range of motion.  Cardiovascular: Normal rate and regular rhythm.  Exam reveals no gallop and no friction rub.   No murmur heard. Pulmonary/Chest: Effort normal and breath sounds normal. No respiratory distress. She has no wheezes. She has no rales.  Abdominal: Soft. Bowel sounds are normal. She exhibits no distension and no mass. There is no tenderness. There is no guarding.  Musculoskeletal: She exhibits no edema or tenderness.  Right hand contracture, left foot drop noted.   Neurological: She is alert.  Skin: Skin is warm and dry.  Psychiatric: She has a normal mood and affect.    Labs reviewed:  Recent Labs  12/18/14 1016  NA 141  K 4.5  BUN 19  CREATININE 0.7    Recent Labs  12/18/14 1016  AST 14  ALT 8  ALKPHOS 57    Recent Labs  12/18/14 1016  WBC 9.7  HGB 10.7*  HCT 33*  PLT 302   Lab Results  Component Value Date   TSH 5.40 03/18/2015   No results found for: HGBA1C No results found for: CHOL, HDL, LDLCALC, LDLDIRECT, TRIG, CHOLHDL  Significant Diagnostic Results in last 30 days:  No results found.  Assessment/Plan 1. Constipation, unspecified constipation type Current regimen effective. Encourage fluid and continue to monitor.   2. Urinary retention Has indwelling foley Catheter draining adequate amounts of yellow clear urine. Continue with Foley care per facility policy.   3. Gastroesophageal  reflux disease without esophagitis Stable on Zantac 150 mg Tablet twice daily. Continue to elevate HOB after meals.   4. Protein-calorie malnutrition ([place]) Has had one pound weight gain over one month. Continue on Magic cup and Medpass supplement. Continue to monitor.     Family/ staff Communication: Reviewed plan of care with facility staff.   Labs/tests ordered: None

## 2015-08-03 ENCOUNTER — Non-Acute Institutional Stay (SKILLED_NURSING_FACILITY): Payer: Medicare Other | Admitting: Family

## 2015-08-03 DIAGNOSIS — G3183 Dementia with Lewy bodies: Secondary | ICD-10-CM

## 2015-08-03 DIAGNOSIS — Z9289 Personal history of other medical treatment: Secondary | ICD-10-CM

## 2015-08-03 DIAGNOSIS — F0393 Unspecified dementia, unspecified severity, with mood disturbance: Secondary | ICD-10-CM

## 2015-08-03 DIAGNOSIS — Z960 Presence of urogenital implants: Secondary | ICD-10-CM

## 2015-08-03 DIAGNOSIS — F329 Major depressive disorder, single episode, unspecified: Secondary | ICD-10-CM | POA: Diagnosis not present

## 2015-08-03 DIAGNOSIS — E46 Unspecified protein-calorie malnutrition: Secondary | ICD-10-CM

## 2015-08-03 DIAGNOSIS — K219 Gastro-esophageal reflux disease without esophagitis: Secondary | ICD-10-CM | POA: Diagnosis not present

## 2015-08-03 DIAGNOSIS — F0280 Dementia in other diseases classified elsewhere without behavioral disturbance: Secondary | ICD-10-CM | POA: Diagnosis not present

## 2015-08-03 DIAGNOSIS — Z978 Presence of other specified devices: Secondary | ICD-10-CM | POA: Insufficient documentation

## 2015-08-03 NOTE — Progress Notes (Signed)
Patient ID: Kristin Frey, female   DOB: 11/07/1929, 82 y.o.   MRN: [id number]  Location:  [place]:  SNF (31)  Provider: PCP: Blanchie Serve, MD Patient Care Team: Blanchie Serve, MD as PCP - General (Internal Medicine) Robert Bellow, MD (General Surgery)  Extended Emergency Contact Information Primary Emergency Contact: Schmidt,Shirley Address: [place] [address] [place], [place] [address] Johnnette Litter of [place] Phone: [phone] Mobile Phone: [phone] Relation: Daughter  Code Status: DNR Goals of care:  Advanced Directive information Advanced Directives 07/08/2015  Does patient have an advance directive? Yes  Type of Paramedic of [place];Out of facility DNR (pink MOST or yellow form)  Does patient want to make changes to advanced directive? No - Patient declined  Copy of advanced directive(s) in chart? Yes     Allergies  Allergen Reactions  . Penicillins Anaphylaxis  . Fish Allergy Hives    Chief Complaint  Patient presents with  . Medical Management of Chronic Issues    HPI:  82 y.o. female  Seen at [facility] for medical management of her chronic illness. She has past medical History of GERD, Depression, Parkinson's disease, Lewy body, dysphagia, constipation, urinary retention, Protein-Calorie Malnutrition among others. Seen today in her room.Patient's history limited due to presence of dementia. Facility staff reports redness to both  Upper and lower eyelid on 07/31/2015 but has resolved.     Past Medical History  Diagnosis Date  . Parkinson disease ([place])   . Carotid artery disease ([place])   . Hernia   . Retinopathy   . DVT (deep venous thrombosis) ([place]) 1998  . High cholesterol   . Hypertension   . Hypothyroidism   . Atrial fibrillation ([place])   . Cancer [facility]) 1998    left breast mastectomy chemo and radiation  . Cancer [facility]) 2014    right breast  wide excision   . Diabetes [facility])     Past Surgical History  Procedure Laterality Date  . Appendectomy  1949  . Carpal tunnel release Bilateral 1976  . Eye surgery  [phone]  . Cholecystectomy  2007  . Breast surgery Left 1998    mastectomy  . Breast surgery Right 2014    wide excision, sentinel node bx, mastoplasty,MammoSite      reports that she has never smoked. She has never used smokeless tobacco. She reports that she does not drink alcohol or use illicit drugs. Social History   Social History  . Marital Status: Married    Spouse Name: N/A  . Number of Children: 3  . Years of Education: 12   Occupational History  . N/A    Social History Main Topics  . Smoking status: Never Smoker   . Smokeless tobacco: Never Used  . Alcohol Use: No  . Drug Use: No  . Sexual Activity: No   Other Topics Concern  . Not on file   Social History Narrative   Patient lives at home alone.   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 Brother developed Parkinson's use to "chew coal" as a child.)   Caffeine Use: 2-3 cups caffeine daily   Functional Status Survey:    Allergies  Allergen Reactions  . Penicillins Anaphylaxis  . Fish Allergy Hives    Pertinent  Health Maintenance Due  Topic Date Due  .  DEXA SCAN  10/18/1996  . PNA vac Low Risk Adult (2 of 2 - PCV13) 03/20/2015  . INFLUENZA VACCINE  01/12/2016    Medications:   Medication List       This list is accurate as of: 08/03/15  1:35 PM.  Always use your most recent med list.               acetaminophen 325 MG tablet  Commonly known as:  TYLENOL  Take 650 mg by mouth every 6 (six) hours as needed.     AMBULATORY NON FORMULARY MEDICATION  Morphine Sul sol 100/57ml Sig: Administer 0.35ml by mouth every 1 hour as needed for shortness of breath/pain     carbidopa-levodopa 25-100 MG tablet  Commonly known as:  SINEMET IR  Take 1 tablet by mouth 3 (three) times daily.      escitalopram 10 MG tablet  Commonly known as:  LEXAPRO  Take 10 mg by mouth daily.     ipratropium-albuterol 0.5-2.5 (3) MG/3ML Soln  Commonly known as:  DUONEB  Take 3 mLs by nebulization every 6 (six) hours as needed.     morphine 20 MG/ML concentrated solution  Commonly known as:  ROXANOL  0.25 mL by mouth every 1 hour as needed for SOB/Pain     primidone 50 MG tablet  Commonly known as:  MYSOLINE  Take 50 mg by mouth 2 (two) times daily.     QUEtiapine 25 MG tablet  Commonly known as:  SEROQUEL  Take 25-50 mg by mouth. 25mg  qam and 50mg  QHS     ranitidine 150 MG tablet  Commonly known as:  ZANTAC  Take 1 tablet (150 mg total) by mouth 2 (two) times daily.     sennosides-docusate sodium 8.6-50 MG tablet  Commonly known as:  SENOKOT-S  Take 2 tablets by mouth at bedtime.     UNABLE TO FIND  Medpass 120 mL honey thick and liquid of choice three times daily     UNABLE TO FIND  Magic Cup at lunch and supper related to weight loss/wound        Review of Systems  Unable to perform ROS: Dementia    Filed Vitals:   08/03/15 1324  BP: 131/62  Pulse: 69  Temp: 97.3 F (36.3 C)  Resp: 20  Weight: 135 lb 9.6 oz (61.508 kg)  SpO2: 93%   Body mass index is 24.03 kg/(m^2). Physical Exam  Constitutional: She appears well-developed.  Obese Elderly in no acute distress.   HENT:  Head: Normocephalic.  Right Ear: External ear normal.  Left Ear: External ear normal.  Mouth/Throat: Oropharynx is clear and moist.  Eyes: EOM are normal. Pupils are equal, round, and reactive to light. Right eye exhibits no discharge. Left eye exhibits no discharge. No scleral icterus.  Neck: Normal range of motion. No JVD present.  Cardiovascular: Normal rate, regular rhythm, normal heart sounds and intact distal pulses.  Exam reveals no gallop and no friction rub.   No murmur heard. Pulmonary/Chest: Effort normal and breath sounds normal. No respiratory distress. She has no wheezes. She  has no rales. She exhibits no tenderness.  Abdominal: Bowel sounds are normal. She exhibits no distension and no mass. There is no tenderness. There is no rebound and no guarding.  Genitourinary:  Foley Catheter in Place draining clear yellow urine   Musculoskeletal: She exhibits no edema or tenderness.  Right hand contracture. Bilateral LE's foot drop   Lymphadenopathy:    She has no  cervical adenopathy.  Neurological: She is alert.  Skin: Skin is warm and dry. No rash noted. No erythema. No pallor.  Psychiatric: She has a normal mood and affect.    Labs reviewed: Basic Metabolic Panel:  Recent Labs  12/18/14 1016  NA 141  K 4.5  BUN 19  CREATININE 0.7   Liver Function Tests:  Recent Labs  12/18/14 1016  AST 14  ALT 8  ALKPHOS 57   No results for input(s): LIPASE, AMYLASE in the last 8760 hours. No results for input(s): AMMONIA in the last 8760 hours. CBC:  Recent Labs  12/18/14 1016  WBC 9.7  HGB 10.7*  HCT 33*  PLT 302   Cardiac Enzymes: No results for input(s): CKTOTAL, CKMB, CKMBINDEX, TROPONINI in the last 8760 hours. BNP: Invalid input(s): POCBNP CBG: No results for input(s): GLUCAP in the last 8760 hours.  Procedures and Imaging Studies During Stay: No results found.  Assessment/Plan:   1. Depression due to dementia No mood changes. Continue on Lexapro 10 mg Tablet   2. Gastroesophageal reflux disease without esophagitis Asymptomatic. Continue on Zantac.   3. Parkinson's disease, Lewy body ([place]) Progressive. Continue on Carbidopa-Levodopa 25-100 mg Tablet. Continue on Hospice services.   4. Protein-calorie malnutrition ([place]) Continue on diet supplements.   5. Foley catheter in place Draining adequate amounts. Continue to monitor. Change Monthly.

## 2015-08-27 ENCOUNTER — Encounter: Payer: Self-pay | Admitting: Family

## 2015-08-27 ENCOUNTER — Non-Acute Institutional Stay (SKILLED_NURSING_FACILITY): Payer: Medicare Other | Admitting: Family

## 2015-08-27 DIAGNOSIS — F329 Major depressive disorder, single episode, unspecified: Secondary | ICD-10-CM | POA: Diagnosis not present

## 2015-08-27 DIAGNOSIS — F0280 Dementia in other diseases classified elsewhere without behavioral disturbance: Secondary | ICD-10-CM | POA: Diagnosis not present

## 2015-08-27 DIAGNOSIS — R339 Retention of urine, unspecified: Secondary | ICD-10-CM

## 2015-08-27 DIAGNOSIS — F0393 Unspecified dementia, unspecified severity, with mood disturbance: Secondary | ICD-10-CM

## 2015-08-27 DIAGNOSIS — G20 Parkinson's disease: Secondary | ICD-10-CM | POA: Diagnosis not present

## 2015-08-27 DIAGNOSIS — E46 Unspecified protein-calorie malnutrition: Secondary | ICD-10-CM

## 2015-08-27 NOTE — Progress Notes (Signed)
Location:  [place] Room Number: [id number] of Service:  SNF (31) Provider: Blanchie Serve, MD  Patient Care Team: Blanchie Serve, MD as PCP - General (Internal Medicine) Robert Bellow, MD (General Surgery)  Extended Emergency Contact Information Primary Emergency Contact: Schmidt,Shirley Address: [address] [place], [place] [address] Johnnette Litter of [place] Phone: [phone] Mobile Phone: [phone] Relation: Daughter  Code Status:DNR Goals of care: Advanced Directive information Advanced Directives 08/27/2015  Does patient have an advance directive? Yes  Type of Advance Directive [place]  Does patient want to make changes to advanced directive? No - Patient declined  Copy of advanced directive(s) in chart? Yes     Chief Complaint  Patient presents with  . Medical Management of Chronic Issues    HPI:  Pt is a 82 y.o. female seen today at [facility] for medical management of chronic diseases. She has a medical history of Parkinson's disease, GERD, Depression among others. She is seen in her room today awake in bed watching TV. History limited due to presence of dementia. Facility staff reports no new concerns. She continue on Hospice service.     Past Medical History  Diagnosis Date  . Parkinson disease ([place])   . Carotid artery disease ([place])   . Hernia   . Retinopathy   . DVT (deep venous thrombosis) ([place]) 1998  . High cholesterol   . Hypertension   . Hypothyroidism   . Atrial fibrillation ([place])   . Cancer [facility]) 1998    left breast mastectomy chemo and radiation  . Cancer [facility]) 2014    right breast wide excision   . Diabetes [facility])    Past Surgical History  Procedure Laterality Date  . Appendectomy  1949  . Carpal tunnel release Bilateral 1976  . Eye surgery  [phone]  . Cholecystectomy  2007  . Breast surgery Left 1998    mastectomy  . Breast surgery  Right 2014    wide excision, sentinel node bx, mastoplasty,MammoSite    Allergies  Allergen Reactions  . Penicillins Anaphylaxis  . Fish Allergy Hives      Medication List       This list is accurate as of: 08/27/15  4:49 PM.  Always use your most recent med list.               acetaminophen 325 MG tablet  Commonly known as:  TYLENOL  Take 650 mg by mouth every 6 (six) hours as needed.     AMBULATORY NON FORMULARY MEDICATION  Morphine Sul sol 100/59ml Sig: Administer 0.18ml by mouth every 1 hour as needed for shortness of breath/pain     carbidopa-levodopa 25-100 MG tablet  Commonly known as:  SINEMET IR  Take 1 tablet by mouth 3 (three) times daily. For parkinsons     escitalopram 10 MG tablet  Commonly known as:  LEXAPRO  Take 10 mg by mouth daily. For depression     ipratropium-albuterol 0.5-2.5 (3) MG/3ML Soln  Commonly known as:  DUONEB  Take 3 mLs by nebulization every 6 (six) hours as needed. For shortness of breath and wheezing     morphine 20 MG/ML concentrated solution  Commonly known as:  ROXANOL  0.25 mL by mouth every 1 hour as needed for SOB/Pain     primidone 50 MG tablet  Commonly known as:  MYSOLINE  Take 50 mg  by mouth 2 (two) times daily. For tremors     QUEtiapine 25 MG tablet  Commonly known as:  SEROQUEL  Take 25 mg by mouth. 25mg  qam and 50mg  QHS     ranitidine 150 MG tablet  Commonly known as:  ZANTAC  Take 1 tablet (150 mg total) by mouth 2 (two) times daily.     sennosides-docusate sodium 8.6-50 MG tablet  Commonly known as:  SENOKOT-S  Take 2 tablets by mouth at bedtime. For constipation     UNABLE TO FIND  Magic Cup at lunch and supper related to weight loss/wound        Review of Systems  Unable to perform ROS: Dementia    Immunization History  Administered Date(s) Administered  . Influenza-Unspecified 03/24/2014, 04/01/2015  . PPD Test 03/20/2014, 03/20/2014, 04/06/2014, 04/06/2014, 02/18/2015, 02/18/2015  .  Pneumococcal-Unspecified 03/19/2014   Pertinent  Health Maintenance Due  Topic Date Due  . DEXA SCAN  10/18/1996  . PNA vac Low Risk Adult (2 of 2 - PCV13) 03/20/2015  . INFLUENZA VACCINE  01/12/2016   No flowsheet data found. Functional Status Survey:    Filed Vitals:   08/27/15 1441  BP: 97/68  Pulse: 72  Temp: 98.6 F (37 C)  TempSrc: Oral  Resp: 17  Height: 5\' 3"  (1.6 m)  Weight: 136 lb (61.689 kg)  SpO2: 97%   Body mass index is 24.1 kg/(m^2). Physical Exam  Constitutional: She appears well-developed and well-nourished. No distress.  HENT:  Head: Normocephalic.  Mouth/Throat: Oropharynx is clear and moist.  Eyes: Conjunctivae and EOM are normal. Pupils are equal, round, and reactive to light. Right eye exhibits no discharge. Left eye exhibits no discharge. No scleral icterus.  Neck: Neck supple. No JVD present.  Cardiovascular: Normal rate, regular rhythm, normal heart sounds and intact distal pulses.  Exam reveals no gallop and no friction rub.   No murmur heard. Pulmonary/Chest: Effort normal and breath sounds normal. No respiratory distress. She has no wheezes. She has no rales.  Abdominal: Soft. Bowel sounds are normal. She exhibits no distension and no mass. There is no tenderness. There is no rebound and no guarding.  Musculoskeletal: She exhibits no edema or tenderness.  Bilateral hand contractures and Bilat foot drop.   Lymphadenopathy:    She has no cervical adenopathy.  Neurological: She is alert.  Skin: Skin is warm and dry. No rash noted. No erythema. No pallor.  Psychiatric: She has a normal mood and affect.    Labs reviewed:  Recent Labs  12/18/14 1016  NA 141  K 4.5  BUN 19  CREATININE 0.7    Recent Labs  12/18/14 1016  AST 14  ALT 8  ALKPHOS 57    Recent Labs  12/18/14 1016  WBC 9.7  HGB 10.7*  HCT 33*  PLT 302   Lab Results  Component Value Date   TSH 5.40 03/18/2015   No results found for: HGBA1C No results found for:  CHOL, HDL, LDLCALC, LDLDIRECT, TRIG, CHOLHDL  Significant Diagnostic Results in last 30 days:  No results found.  Assessment/Plan Depression No mood changes. Continue on Lexapro 10 mg tablet   Parkinson's Disease  Continue on sinemet IR.Assist with all meals. Aspiration Precaution. Continue hospice service.   Protein Calorie Malnutrition Continue on supplement. Continue Assistance with feeding.    Urinary retention  Foley cath in place draining clear yellow urine.Continue to monitor.    Family/ staff Communication: reviewed plan of care with facility Nurse.  Labs/tests ordered:  None

## 2015-09-23 ENCOUNTER — Non-Acute Institutional Stay (SKILLED_NURSING_FACILITY): Payer: Medicare Other | Admitting: Family

## 2015-09-23 ENCOUNTER — Encounter: Payer: Self-pay | Admitting: Family

## 2015-09-23 DIAGNOSIS — G3183 Dementia with Lewy bodies: Secondary | ICD-10-CM

## 2015-09-23 DIAGNOSIS — F329 Major depressive disorder, single episode, unspecified: Secondary | ICD-10-CM

## 2015-09-23 DIAGNOSIS — R1310 Dysphagia, unspecified: Secondary | ICD-10-CM | POA: Diagnosis not present

## 2015-09-23 DIAGNOSIS — E46 Unspecified protein-calorie malnutrition: Secondary | ICD-10-CM

## 2015-09-23 DIAGNOSIS — K219 Gastro-esophageal reflux disease without esophagitis: Secondary | ICD-10-CM

## 2015-09-23 DIAGNOSIS — Z960 Presence of urogenital implants: Secondary | ICD-10-CM

## 2015-09-23 DIAGNOSIS — R269 Unspecified abnormalities of gait and mobility: Secondary | ICD-10-CM

## 2015-09-23 DIAGNOSIS — F0393 Unspecified dementia, unspecified severity, with mood disturbance: Secondary | ICD-10-CM

## 2015-09-23 DIAGNOSIS — Z978 Presence of other specified devices: Secondary | ICD-10-CM

## 2015-09-23 DIAGNOSIS — F0280 Dementia in other diseases classified elsewhere without behavioral disturbance: Secondary | ICD-10-CM | POA: Diagnosis not present

## 2015-09-23 DIAGNOSIS — Z9289 Personal history of other medical treatment: Secondary | ICD-10-CM | POA: Diagnosis not present

## 2015-09-23 NOTE — Progress Notes (Signed)
Location:  [place] Room Number: [place] of Service:  SNF (31) Provider:  Marlowe Sax, FNP-C Blanchie Serve, MD  Patient Care Team: Blanchie Serve, MD as PCP - General (Internal Medicine) Robert Bellow, MD (General Surgery)  Extended Emergency Contact Information Primary Emergency Contact: Schmidt,Shirley Address: [place], [place] [address] Johnnette Litter of [place] Phone: [phone] Mobile Phone: [phone] Relation: Daughter  Code Status: DNR Goals of care: Advanced Directive information Advanced Directives 09/23/2015  Does patient have an advance directive? Yes  Type of Advance Directive Out of facility DNR (pink MOST or yellow form)  Does patient want to make changes to advanced directive? No - Patient declined  Copy of advanced directive(s) in chart? Yes     Chief Complaint  Patient presents with  . Medical Management of Chronic Issues    Routine Visit    HPI:  Pt is a 82 y.o. female seen today [facility] for medical management of chronic diseases. She has a medical history of Parkinson disease, CAD, Depression,  GERD, Dysphagia, Afib, hypothyroidism, Breast CA, DM among others. She is seen in her room today. Unable to obtain HPI and ROS due to dementia. Facility staff reports no new concerns. She sits  up on her wheelchair daily in the dinning area without any agitation. Her Protein supplements recent discontinued due to progressive gaining of weight.    Past Medical History  Diagnosis Date  . Parkinson disease ([place])   . Carotid artery disease ([place])   . Hernia   . Retinopathy   . DVT (deep venous thrombosis) ([place]) 1998  . High cholesterol   . Hypertension   . Hypothyroidism   . Atrial fibrillation ([place])   . Cancer [facility]) 1998    left breast mastectomy chemo and radiation  . Cancer [facility]) 2014    right breast wide excision   . Diabetes [facility])    Past Surgical  History  Procedure Laterality Date  . Appendectomy  1949  . Carpal tunnel release Bilateral 1976  . Eye surgery  [phone]  . Cholecystectomy  2007  . Breast surgery Left 1998    mastectomy  . Breast surgery Right 2014    wide excision, sentinel node bx, mastoplasty,MammoSite    Allergies  Allergen Reactions  . Penicillins Anaphylaxis  . Fish Allergy Hives      Medication List       This list is accurate as of: 09/23/15 10:08 AM.  Always use your most recent med list.               acetaminophen 325 MG tablet  Commonly known as:  TYLENOL  2 tabs every 6 hrs prn pain or fever     acetaminophen 650 MG CR tablet  Commonly known as:  TYLENOL  Take 650 mg by mouth 3 (three) times daily.     carbidopa-levodopa 25-100 MG tablet  Commonly known as:  SINEMET IR  Take 1 tablet by mouth 3 (three) times daily. For parkinsons     escitalopram 10 MG tablet  Commonly known as:  LEXAPRO  Take 10 mg by mouth daily. For depression     ipratropium-albuterol 0.5-2.5 (3) MG/3ML Soln  Commonly known as:  DUONEB  Take 3 mLs by nebulization every 6 (six) hours as needed. For shortness of breath and wheezing     morphine 20 MG/ML concentrated solution  Commonly known as:  ROXANOL  0.25 mL by mouth every 1 hour as needed for SOB/Pain     OXYGEN  Inhale 2 L into the lungs.     primidone 50 MG tablet  Commonly known as:  MYSOLINE  Take 50 mg by mouth 2 (two) times daily. For tremors     QUEtiapine 25 MG tablet  Commonly known as:  SEROQUEL  25mg  qam and 50mg  QHS     ranitidine 150 MG tablet  Commonly known as:  ZANTAC  Take 150 mg by mouth daily.     sennosides-docusate sodium 8.6-50 MG tablet  Commonly known as:  SENOKOT-S  Take 2 tablets by mouth at bedtime. For constipation     zinc oxide 11.3 % Crea cream  Commonly known as:  BALMEX  Apply 1 application topically daily as needed.        Review of Systems  Unable to perform ROS: Dementia    Immunization History   Administered Date(s) Administered  . Influenza-Unspecified 03/24/2014, 04/01/2015  . PPD Test 03/20/2014, 03/20/2014, 04/06/2014, 04/06/2014, 02/18/2015, 02/18/2015  . Pneumococcal Conjugate-13 04/23/2015  . Pneumococcal-Unspecified 03/19/2014   Pertinent  Health Maintenance Due  Topic Date Due  . DEXA SCAN  06/14/2023 (Originally 10/18/1996)  . INFLUENZA VACCINE  01/12/2016  . PNA vac Low Risk Adult  Completed   No flowsheet data found. Functional Status Survey:    Filed Vitals:   09/23/15 0938  BP: 105/50  Pulse: 69  Temp: 97.8 F (36.6 C)  Height: 5\' 3"  (1.6 m)  Weight: 138 lb 9.6 oz (62.869 kg)  SpO2: 94%   Body mass index is 24.56 kg/(m^2). Physical Exam  Constitutional: She appears well-developed and well-nourished. No distress.  HENT:  Head: Normocephalic.  Mouth/Throat: Oropharynx is clear and moist.  Eyes: Conjunctivae and EOM are normal. Pupils are equal, round, and reactive to light. Right eye exhibits no discharge. Left eye exhibits no discharge. No scleral icterus.  Neck: Normal range of motion. No JVD present. No thyromegaly present.  Cardiovascular: Normal rate, regular rhythm, normal heart sounds and intact distal pulses.  Exam reveals no gallop and no friction rub.   No murmur heard. Pulmonary/Chest: Effort normal and breath sounds normal. No respiratory distress. She has no wheezes. She has no rales.  Abdominal: Soft. Bowel sounds are normal. She exhibits no distension. There is no tenderness. There is no rebound and no guarding.  Genitourinary:  Foley Catheter draining adequate amounts of yellow clear urine.   Musculoskeletal: She exhibits no edema or tenderness.  Lymphadenopathy:    She has no cervical adenopathy.  Neurological: She is alert.  Response verbally in low tone at times.   Skin: Skin is warm and dry. No rash noted. No erythema. No pallor.    Labs reviewed:  Recent Labs  12/18/14 1016  NA 141  K 4.5  BUN 19  CREATININE 0.7     Recent Labs  12/18/14 1016  AST 14  ALT 8  ALKPHOS 57    Recent Labs  12/18/14 1016  WBC 9.7  HGB 10.7*  HCT 33*  PLT 302   Lab Results  Component Value Date   TSH 5.40 03/18/2015   No results found for: HGBA1C No results found for: CHOL, HDL, LDLCALC, LDLDIRECT, TRIG, CHOLHDL  Significant Diagnostic Results in last 30 days:  No results found.  Assessment/Plan Parkinson's Disease  Continue on Carbidopa-levodopa  Protein Malnutrition Progressive weight gain. Protein supplements discontinued. Continue to monitor for weight loss. Continue  to assist with feeding.   GERD Stable. Continue to monitor.   Dysphagia Assist with meals. Aspiration precaution.   Abnormal Gait  Uses standard wheelchair. Fall and safety precautions.   Foley Catheter  Draining adequate amounts of yellow clear urine. Continue foley care per facility protocol.   Depression Stable on escitalopram. Continue to monitor for mood changes.   Dementia  No new behavioral issues reported. Continue to assist with ADL's. Fall and safety precautions. Skin Care.    Family/ staff Communication: Reviewed plan with facility Nurse supervisor.  Labs/tests ordered: None

## 2015-10-22 ENCOUNTER — Encounter: Payer: Self-pay | Admitting: Family

## 2015-10-22 ENCOUNTER — Non-Acute Institutional Stay (SKILLED_NURSING_FACILITY): Payer: Medicare Other | Admitting: Family

## 2015-10-22 DIAGNOSIS — F329 Major depressive disorder, single episode, unspecified: Secondary | ICD-10-CM | POA: Diagnosis not present

## 2015-10-22 DIAGNOSIS — G20 Parkinson's disease: Secondary | ICD-10-CM | POA: Diagnosis not present

## 2015-10-22 DIAGNOSIS — F0393 Unspecified dementia, unspecified severity, with mood disturbance: Secondary | ICD-10-CM

## 2015-10-22 DIAGNOSIS — K219 Gastro-esophageal reflux disease without esophagitis: Secondary | ICD-10-CM

## 2015-10-22 DIAGNOSIS — F0280 Dementia in other diseases classified elsewhere without behavioral disturbance: Secondary | ICD-10-CM | POA: Diagnosis not present

## 2015-10-22 DIAGNOSIS — E46 Unspecified protein-calorie malnutrition: Secondary | ICD-10-CM

## 2015-10-22 DIAGNOSIS — R1310 Dysphagia, unspecified: Secondary | ICD-10-CM

## 2015-10-22 NOTE — Progress Notes (Signed)
Location:  [place] Room Number: [place] of Service:  SNF [phone]) Provider:  Marlowe Sax, NP  Blanchie Serve, MD  Patient Care Team: Blanchie Serve, MD as PCP - General (Internal Medicine) Robert Bellow, MD (General Surgery)  Extended Emergency Contact Information Primary Emergency Contact: Schmidt,Shirley Address: [address], [place] [address] Johnnette Litter of [place] Phone: [phone] Mobile Phone: [phone] Relation: Daughter  Code Status: DNR  Goals of care: Advanced Directive information Advanced Directives 10/22/2015  Does patient have an advance directive? Yes  Type of Advance Directive Out of facility DNR (pink MOST or yellow form)  Does patient want to make changes to advanced directive? No - Patient declined  Copy of advanced directive(s) in chart? Yes     Chief Complaint  Patient presents with  . Medical Management of Chronic Issues    Routine Visit    HPI:  Pt is a 82 y.o. female seen today at [facility] for medical management of chronic diseases.She has a medical history of Parkinson's disease, Dementia, Dysphagia among others. She is seen today in her room. Attempts to response verbally. Facility staff reports no new concerns. Unable to obtain HPI and ROS due to her dementia. She has progressively gained weight now stable. Protein supplement discontinued due to increased oral intake.    Past Medical History  Diagnosis Date  . Parkinson disease ([employer])   . Carotid artery disease ([place])   . Hernia   . Retinopathy   . DVT (deep venous thrombosis) ([place]) 1998  . High cholesterol   . Hypertension   . Hypothyroidism   . Atrial fibrillation ([place])   . Cancer [facility]) 1998    left breast mastectomy chemo and radiation  . Cancer [facility]) 2014    right breast wide excision   . Diabetes [facility])    Past Surgical History  Procedure Laterality Date  . Appendectomy  1949  . Carpal  tunnel release Bilateral 1976  . Eye surgery  [phone]  . Cholecystectomy  2007  . Breast surgery Left 1998    mastectomy  . Breast surgery Right 2014    wide excision, sentinel node bx, mastoplasty,MammoSite    Allergies  Allergen Reactions  . Penicillins Anaphylaxis  . Fish Allergy Hives      Medication List       This list is accurate as of: 10/22/15  9:40 AM.  Always use your most recent med list.               acetaminophen 325 MG tablet  Commonly known as:  TYLENOL  2 tabs every 6 hrs prn pain or fever Do not exceed 4 gm of tylenol in 24 hours.     acetaminophen 650 MG CR tablet  Commonly known as:  TYLENOL  Take 650 mg by mouth 3 (three) times daily.     carbidopa-levodopa 25-100 MG tablet  Commonly known as:  SINEMET IR  Take 1 tablet by mouth 3 (three) times daily. For parkinsons     escitalopram 10 MG tablet  Commonly known as:  LEXAPRO  Take 10 mg by mouth every morning. For depression     ipratropium-albuterol 0.5-2.5 (3) MG/3ML Soln  Commonly known as:  DUONEB  Take 3 mLs by nebulization every 6 (six) hours as needed. For shortness of breath and wheezing     morphine 20 MG/ML concentrated solution  Commonly known as:  ROXANOL  0.25 mL by mouth every 1 hour as needed for SOB/Pain     OXYGEN  Inhale 2 L into the lungs.     primidone 50 MG tablet  Commonly known as:  MYSOLINE  Take 50 mg by mouth 2 (two) times daily. For tremors     QUEtiapine 25 MG tablet  Commonly known as:  SEROQUEL  Take 25 mg by mouth daily.     QUEtiapine 50 MG tablet  Commonly known as:  SEROQUEL  Take 50 mg by mouth at bedtime.     ranitidine 150 MG tablet  Commonly known as:  ZANTAC  Take 150 mg by mouth daily.     sennosides-docusate sodium 8.6-50 MG tablet  Commonly known as:  SENOKOT-S  Take 2 tablets by mouth at bedtime. For constipation     UNABLE TO FIND  Med Name: Med Pass : Give 120 ml of honey thick fluid of choice three times a day with Med pass for  additional hydration.     zinc oxide 11.3 % Crea cream  Commonly known as:  BALMEX  Apply cream topically to the buttocks daily and as needed        Review of Systems  Unable to perform ROS: Dementia    Immunization History  Administered Date(s) Administered  . Influenza-Unspecified 03/24/2014, 04/01/2015  . PPD Test 03/20/2014, 03/20/2014, 04/06/2014, 04/06/2014, 02/18/2015, 02/18/2015  . Pneumococcal Conjugate-13 04/23/2015  . Pneumococcal-Unspecified 03/19/2014   Pertinent  Health Maintenance Due  Topic Date Due  . DEXA SCAN  06/14/2023 (Originally 10/18/1996)  . INFLUENZA VACCINE  01/12/2016  . PNA vac Low Risk Adult  Completed   No flowsheet data found. Functional Status Survey:    Filed Vitals:   10/22/15 0919  BP: 112/53  Pulse: 78  Temp: 97.5 F (36.4 C)  Height: 5\' 3"  (1.6 m)  Weight: 135 lb (61.236 kg)  SpO2: 94%   Body mass index is 23.92 kg/(m^2). Physical Exam  Constitutional: She appears well-developed and well-nourished. No distress.  HENT:  Head: Normocephalic.  Mouth/Throat: Oropharynx is clear and moist. No oropharyngeal exudate.  Eyes: Conjunctivae and EOM are normal. Pupils are equal, round, and reactive to light. Right eye exhibits no discharge. Left eye exhibits no discharge. No scleral icterus.  Neck: Normal range of motion. No JVD present.  Cardiovascular: Normal rate, regular rhythm, normal heart sounds and intact distal pulses.  Exam reveals no gallop and no friction rub.   No murmur heard. Pulmonary/Chest: Effort normal and breath sounds normal. No respiratory distress. She has no wheezes. She has no rales.  Abdominal: Soft. Bowel sounds are normal. She exhibits no distension. There is no tenderness. There is no rebound and no guarding.  Genitourinary:  Foley Catheter draining aqeduate amounts of urine.   Musculoskeletal: She exhibits no edema or tenderness.  Right hand contracture.   Lymphadenopathy:    She has no cervical adenopathy.   Neurological: She is alert.  Skin: Skin is warm and dry. No rash noted. No erythema. No pallor.  Psychiatric: She has a normal mood and affect.    Labs reviewed:  Recent Labs  12/18/14 1016  NA 141  K 4.5  BUN 19  CREATININE 0.7    Recent Labs  12/18/14 1016  AST 14  ALT 8  ALKPHOS 57    Recent Labs  12/18/14 1016  WBC 9.7  HGB 10.7*  HCT 33*  PLT 302   Lab Results  Component Value  Date   TSH 5.40 03/18/2015   No results found for: HGBA1C No results found for: CHOL, HDL, LDLCALC, LDLDIRECT, TRIG, CHOLHDL  Significant Diagnostic Results in last 30 days:  No results found.  Assessment/Plan  Parkinson's disease  Progressive. Requires total assistance with ADL's. Continue on Carbidopa-levodopa 25-100 mg tablet.Continue to monitor.    Depression Mood stable. Continue on escitalopram 10 mg Tablet.   Dementia Progressive. Continue with Seroquel. Continue to assist with ADL's. Fall and safety precautions.   Protein Calorie Malnutrition  Progressive Weight gain. Protein supplement discontinued due to improved oral intake. Continue to monitor. Monitor BMP in 4 weeks.   Dysphagia Continue on puree diet with honey thicken liquids. Aspiration precaution.   GERD Stable on Zantac 150 mg tablet  Family/ staff Communication: Reviewed plan of care with facility Nurse supervisor.   Labs/tests ordered:   BMP in 4 weeks

## 2015-11-20 LAB — BASIC METABOLIC PANEL: Potassium: 4.7 mmol/L (ref 3.4–5.3)

## 2015-11-23 ENCOUNTER — Non-Acute Institutional Stay (SKILLED_NURSING_FACILITY): Payer: Medicare Other | Admitting: Internal Medicine

## 2015-11-23 ENCOUNTER — Encounter: Payer: Self-pay | Admitting: Internal Medicine

## 2015-11-23 DIAGNOSIS — D638 Anemia in other chronic diseases classified elsewhere: Secondary | ICD-10-CM | POA: Diagnosis not present

## 2015-11-23 DIAGNOSIS — G20 Parkinson's disease: Secondary | ICD-10-CM

## 2015-11-23 DIAGNOSIS — F329 Major depressive disorder, single episode, unspecified: Secondary | ICD-10-CM | POA: Diagnosis not present

## 2015-11-23 DIAGNOSIS — K219 Gastro-esophageal reflux disease without esophagitis: Secondary | ICD-10-CM | POA: Diagnosis not present

## 2015-11-23 DIAGNOSIS — R1310 Dysphagia, unspecified: Secondary | ICD-10-CM

## 2015-11-23 DIAGNOSIS — S41111D Laceration without foreign body of right upper arm, subsequent encounter: Secondary | ICD-10-CM | POA: Diagnosis not present

## 2015-11-23 DIAGNOSIS — R0600 Dyspnea, unspecified: Secondary | ICD-10-CM

## 2015-11-23 DIAGNOSIS — F0280 Dementia in other diseases classified elsewhere without behavioral disturbance: Secondary | ICD-10-CM

## 2015-11-23 DIAGNOSIS — E46 Unspecified protein-calorie malnutrition: Secondary | ICD-10-CM | POA: Diagnosis not present

## 2015-11-23 NOTE — Progress Notes (Signed)
Patient ID: Kristin Frey, female   DOB: 1930-02-07, 82 y.o.   MRN: [id number]     Facility: [facility]   Code status: DNR  Chief Complaint  Patient presents with  . Medical Management of Chronic Issues    Routine Visit    Allergies  Allergen Reactions  . Penicillins Anaphylaxis  . Fish Allergy Hives   Advanced Directives 11/23/2015  Does patient have an advance directive? Yes  Type of Advance Directive Out of facility DNR (pink MOST or yellow form)  Does patient want to make changes to advanced directive? No - Patient declined  Copy of advanced directive(s) in chart? Yes    HPI 82 y/o female pt is seen for routine visit. She has been at her baseline with dementia and is non verbal. She is now out of hospice services.  She has skin tear to her right arm and is getting skin care for this. She is OOB daily. She is under total care and needs assistance with feeding. No fall reported. No pressure ulcer. No new behavioral changes reported.  ROS Unable to obtain  Past Medical History  Diagnosis Date  . Parkinson disease ([place])   . Carotid artery disease ([place])   . Hernia   . Retinopathy   . DVT (deep venous thrombosis) ([place]) 1998  . High cholesterol   . Hypertension   . Hypothyroidism   . Atrial fibrillation ([place])   . Cancer [facility]) 1998    left breast mastectomy chemo and radiation  . Cancer [facility]) 2014    right breast wide excision   . Diabetes [facility])      Medication List       This list is accurate as of: 11/23/15  3:07 PM.  Always use your most recent med list.               acetaminophen 325 MG tablet  Commonly known as:  TYLENOL  2 tabs every 6 hrs prn pain or fever Do not exceed 4 gm of tylenol in 24 hours.     acetaminophen 650 MG CR tablet  Commonly known as:  TYLENOL  Take 650 mg by mouth 3 (three) times daily.     carbidopa-levodopa 25-100 MG tablet  Commonly known as:  SINEMET IR  Take 1 tablet by mouth 3 (three) times  daily. For parkinsons     escitalopram 10 MG tablet  Commonly known as:  LEXAPRO  Take 10 mg by mouth every morning. For depression     ipratropium-albuterol 0.5-2.5 (3) MG/3ML Soln  Commonly known as:  DUONEB  Take 3 mLs by nebulization every 6 (six) hours as needed. For shortness of breath and wheezing     morphine 20 MG/ML concentrated solution  Commonly known as:  ROXANOL  0.25 mL by mouth every 1 hour as needed for SOB/Pain     OXYGEN  Inhale 2 L into the lungs.     primidone 50 MG tablet  Commonly known as:  MYSOLINE  Take 50 mg by mouth 2 (two) times daily. For tremors     QUEtiapine 25 MG tablet  Commonly known as:  SEROQUEL  Take 25 mg by mouth daily.     QUEtiapine 50 MG tablet  Commonly known as:  SEROQUEL  Take 50 mg by mouth at bedtime.     ranitidine 150 MG tablet  Commonly known as:  ZANTAC  Take 150 mg by mouth daily.     sennosides-docusate sodium 8.6-50  MG tablet  Commonly known as:  SENOKOT-S  Take 2 tablets by mouth at bedtime. For constipation     UNABLE TO FIND  Med Name: Med Pass : Give 120 ml of honey thick fluid of choice three times a day with Med pass for additional hydration.       Physical exam BP 126/70 mmHg  Pulse 80  Temp(Src) 96.8 F (36 C) (Oral)  Resp 20  Ht 5\' 3"  (1.6 m)  Wt 136 lb (61.689 kg)  BMI 24.10 kg/m2  SpO2 99%  Wt Readings from Last 3 Encounters:  11/23/15 136 lb (61.689 kg)  10/22/15 135 lb (61.236 kg)  09/23/15 138 lb 9.6 oz (62.869 kg)   General- elderly female in no acute distress Head- atraumatic, normocephalic Eyes- no pallor, no icterus, no discharge Mouth- normal mucus membrane Cardiovascular- normal s1,s2, no murmurs Respiratory- bilateral clear to auscultation, no wheeze, no rhonchi, no crackles Abdomen- bowel sounds present, soft, non tender, foley in place Musculoskeletal- able to move all 4 extremities, trace leg edema Neurological- resting hand tremors Skin- warm and dry,right forearm skin  tear Psychiatry- alert and calm   Labs CBC Latest Ref Rng 12/18/2014 05/22/2014 04/28/2014  WBC - 9.7 8.1 11.3  Hemoglobin 12.0 - 16.0 g/dL 10.7(A) 10.9(A) 10.3(A)  Hematocrit 36 - 46 % 33(A) 35(A) 34(A)  Platelets 150 - 399 K/L 302 209 282   CMP Latest Ref Rng 12/18/2014 06/25/2014 05/22/2014  Glucose 65-99 mg/dL - - -  BUN 4 - 21 mg/dL 19 16 21   Creatinine 0.5 - 1.1 mg/dL 0.7 0.9 1.6(A)  Sodium 137 - 147 mmol/L 141 136(A) 139  Potassium 3.4 - 5.3 mmol/L 4.5 4.5 3.6  Chloride 98-107 mmol/L - - -  CO2 21-32 mmol/L - - -  Calcium 8.5-10.1 mg/dL - - -  Total Protein 6.4-8.2 g/dL - - -  Total Bilirubin 0.2-1.0 mg/dL - - -  Alkaline Phos 25 - 125 U/L 57 - -  AST 13 - 35 U/L 14 - -  ALT 7 - 35 U/L 8 - -     Assessment/plan  Dysphagia Will have SLP to follow, assistance with feeding, aspiration precautions. Continue pureed food with honey thick liquids  Skin tear Has full thickness skin tear to right forearm. Continue skin care and to wear geri sleeves  Protein calorie malnutrition Continue magic cup supplement with pureed diet for now. Has been losing weight and with her dementia,decline anticipated  Chronic depression Stable mood. Continue escitalopram 10 mg daily and quetiapine 25 mg am and 50 mg pm daily  Parkinson's dementia Stable, continue sinemet and primidone 50 mg bid. Continue supportive care. Fall precautions. Pressure ulcer prophylaxis.   gerd Stable on ranitidine, monitor  dyspnea None at present. On o2 and prn roxanol, breathing has been stable, continue roxanol but change from q1h prn to q6h prn for now and wean off as tolerated. Wean off o2 as tolerated to keep o2 sat > 90%. Reviewed her cxr from facility, does not show any chronic lung disease changes  Anemia of chronic disease Monitor cbc and cmp  Labs ordered- cbc, cmp  Blanchie Serve, MD  [facility] [phone] (Monday-Friday 8 am - 5 pm) [phone] (afterhours)

## 2015-11-24 LAB — CBC AND DIFFERENTIAL
HCT: 36 % (ref 36–46)
Hemoglobin: 10.8 g/dL — AB (ref 12.0–16.0)
WBC: 9.2 10^3/mL

## 2015-11-24 LAB — HEPATIC FUNCTION PANEL
ALK PHOS: 80 U/L (ref 25–125)
ALT: 9 U/L (ref 7–35)
Bilirubin, Total: 0.3 mg/dL

## 2015-11-24 LAB — BASIC METABOLIC PANEL
BUN: 21 mg/dL (ref 4–21)
Creatinine: 0.8 mg/dL (ref <=1.1)
SODIUM: 140 mmol/L (ref 137–147)

## 2015-11-26 ENCOUNTER — Other Ambulatory Visit: Payer: Self-pay | Admitting: *Deleted

## 2015-11-26 MED ORDER — AMBULATORY NON FORMULARY MEDICATION: Status: DC

## 2015-11-26 NOTE — Telephone Encounter (Signed)
[facility] 

## 2015-12-24 ENCOUNTER — Non-Acute Institutional Stay (SKILLED_NURSING_FACILITY): Payer: Medicare Other | Admitting: Family

## 2015-12-24 ENCOUNTER — Encounter: Payer: Self-pay | Admitting: Family

## 2015-12-24 DIAGNOSIS — R339 Retention of urine, unspecified: Secondary | ICD-10-CM | POA: Diagnosis not present

## 2015-12-24 DIAGNOSIS — F329 Major depressive disorder, single episode, unspecified: Secondary | ICD-10-CM | POA: Diagnosis not present

## 2015-12-24 DIAGNOSIS — R1310 Dysphagia, unspecified: Secondary | ICD-10-CM

## 2015-12-24 DIAGNOSIS — F0280 Dementia in other diseases classified elsewhere without behavioral disturbance: Secondary | ICD-10-CM

## 2015-12-24 DIAGNOSIS — F0393 Unspecified dementia, unspecified severity, with mood disturbance: Secondary | ICD-10-CM

## 2015-12-24 DIAGNOSIS — G20 Parkinson's disease: Secondary | ICD-10-CM | POA: Diagnosis not present

## 2015-12-24 NOTE — Progress Notes (Signed)
Location:    [place] [place] Room Number: 207-P Place of Service:  SNF (31) Provider: Marlowe Sax FNP-C    Patient Care Team: Blanchie Serve, MD as PCP - General (Internal Medicine) Robert Bellow, MD (General Surgery)  Extended Emergency Contact Information Primary Emergency Contact: Schmidt,Shirley Address: [address] [place], [place] [address] Johnnette Litter of [place] Phone: [phone] Mobile Phone: [phone] Relation: Daughter   Code Status: Full Code Goals of care: Advanced Directive information Advanced Directives 12/24/2015  Does patient have an advance directive? Yes  Type of Advance Directive Out of facility DNR (pink MOST or yellow form);Healthcare Power of Attorney  Does patient want to make changes to advanced directive? No - Patient declined  Copy of advanced directive(s) in chart? -     Chief Complaint  Patient presents with  . Medical Management of Chronic Issues    HPI:  Pt is a 82 y.o. female seen today at [facility]  for medical management of chronic diseases. She is seen in her room today. She is non-verbal during visit. Her B/p log ranging in the 80's/40's to 90's/40's. Left heel pressure ulcer progressive healing. She continues to require total care assistance. Facility staff report no new concerns.    Past Medical History  Diagnosis Date  . Parkinson disease ([place])   . Carotid artery disease ([place])   . Hernia   . Retinopathy   . DVT (deep venous thrombosis) ([place]) 1998  . High cholesterol   . Hypertension   . Hypothyroidism   . Atrial fibrillation ([place])   . Cancer [facility]) 1998    left breast mastectomy chemo and radiation  . Cancer [facility]) 2014    right breast wide excision   . Diabetes [facility])    Past Surgical History  Procedure Laterality Date  . Appendectomy  1949  . Carpal tunnel release Bilateral 1976  . Eye surgery  [phone]  . Cholecystectomy  2007  . Breast  surgery Left 1998    mastectomy  . Breast surgery Right 2014    wide excision, sentinel node bx, mastoplasty,MammoSite    Allergies  Allergen Reactions  . Penicillins Anaphylaxis  . Fish Allergy Hives      Medication List       This list is accurate as of: 12/24/15 12:06 PM.  Always use your most recent med list.               acetaminophen 325 MG tablet  Commonly known as:  TYLENOL  2 tabs every 6 hrs prn pain or fever Do not exceed 4 gm of tylenol in 24 hours.     acetaminophen 650 MG CR tablet  Commonly known as:  TYLENOL  Take 650 mg by mouth 3 (three) times daily.     carbidopa-levodopa 25-100 MG tablet  Commonly known as:  SINEMET IR  Take 1 tablet by mouth 3 (three) times daily. For parkinsons     escitalopram 10 MG tablet  Commonly known as:  LEXAPRO  Take 10 mg by mouth every morning. For depression     ipratropium-albuterol 0.5-2.5 (3) MG/3ML Soln  Commonly known as:  DUONEB  Take 3 mLs by nebulization every 6 (six) hours as needed. For shortness of breath and wheezing     morphine 20 MG/ML concentrated solution  Commonly known as:  ROXANOL  Give 0.57ml by by mouth every 6 hour as  needed for dyspnea, and every hour for pain.     OXYGEN  Inhale 2 L into the lungs.     primidone 50 MG tablet  Commonly known as:  MYSOLINE  Take 50 mg by mouth 2 (two) times daily. For tremors     QUEtiapine 25 MG tablet  Commonly known as:  SEROQUEL  Take 25 mg by mouth daily. For psychosis     QUEtiapine 50 MG tablet  Commonly known as:  SEROQUEL  Take 50 mg by mouth at bedtime. For psychosis     ranitidine 150 MG tablet  Commonly known as:  ZANTAC  Take 150 mg by mouth daily. For gerd     sennosides-docusate sodium 8.6-50 MG tablet  Commonly known as:  SENOKOT-S  Take 2 tablets by mouth at bedtime. For constipation     UNABLE TO FIND  Med Name: Med Pass : Give 120 ml of honey thick fluid of choice daily with Med pass for additional hydration.         Review of Systems  Unable to perform ROS: Dementia    Immunization History  Administered Date(s) Administered  . Influenza-Unspecified 03/24/2014, 04/01/2015  . PPD Test 03/20/2014, 03/20/2014, 04/06/2014, 04/06/2014, 02/18/2015, 02/18/2015  . Pneumococcal Conjugate-13 04/23/2015  . Pneumococcal-Unspecified 03/19/2014   Pertinent  Health Maintenance Due  Topic Date Due  . DEXA SCAN  06/14/2023 (Originally 10/18/1996)  . INFLUENZA VACCINE  01/12/2016  . PNA vac Low Risk Adult  Completed   No flowsheet data found. Functional Status Survey:    Filed Vitals:   12/24/15 1054  BP: 96/46  Pulse: 63  Temp: 97.3 F (36.3 C)  Resp: 18  Height: 5\' 3"  (1.6 m)  Weight: 136 lb (61.689 kg)  SpO2: 95%   Body mass index is 24.1 kg/(m^2). Physical Exam  Constitutional: She appears well-developed and well-nourished. No distress.  Elderly in no acute distress   HENT:  Head: Normocephalic.  Mouth/Throat: Oropharynx is clear and moist. No oropharyngeal exudate.  Eyes: Conjunctivae and EOM are normal. Pupils are equal, round, and reactive to light. Right eye exhibits no discharge. Left eye exhibits no discharge. No scleral icterus.  Neck: Normal range of motion. No JVD present.  Cardiovascular: Normal rate, regular rhythm, normal heart sounds and intact distal pulses.  Exam reveals no gallop and no friction rub.   No murmur heard. Pulmonary/Chest: Effort normal and breath sounds normal. No respiratory distress. She has no wheezes. She has no rales.  Abdominal: Soft. Bowel sounds are normal. She exhibits no distension. There is no tenderness. There is no rebound and no guarding.  Genitourinary:  Foley Catheter draining aqeduate amounts of urine.   Musculoskeletal: She exhibits no edema or tenderness.  Right hand contracture.   Lymphadenopathy:    She has no cervical adenopathy.  Neurological: She is alert.  Non-verbal   Skin: Skin is warm and dry. No rash noted. No erythema. No  pallor.  Psychiatric: She has a normal mood and affect.    Labs reviewed:  Recent Labs  11/20/15 11/24/15  NA  --  140  K 4.7  --   BUN  --  21  CREATININE  --  0.8    Recent Labs  11/24/15  ALT 9  ALKPHOS 80    Recent Labs  11/24/15  WBC 9.2  HGB 10.8*  HCT 36   Lab Results  Component Value Date   TSH 5.40 03/18/2015   No results found for: HGBA1C No results found  for: CHOL, HDL, LDLCALC, LDLDIRECT, TRIG, CHOLHDL  Significant Diagnostic Results in last 30 days:  No results found.  Assessment/Plan Parkinson's disease  Progressive decline.Continue on Carbidopa-levodopa 25-100 mg tablet daily.continue to assist with ADL's.  Depression No mood changes. Continue on escitalopram 10 mg tablet daily.    Dysphagia  Continue with Aspiration precautions.   Urinary Retention Continue foley Catheter to straight drain. Change per facility protocol.   Left heel pressure ulcer  Afebrile. No drainage noted. Continue with wound care.   Hypotension  B/p log ranging in the 80's/40's -90's/40's for the past few days. Encourage fluid intake. Monitor B/p Bid x 1 week then resume previous orders.          Family/ staff Communication:  Reviewed plan of care with facility Nurse supervisor.  Labs/tests ordered: None

## 2016-01-11 IMAGING — CR DG CHEST 1V PORT
1 series · 1 of 1 positions shown · non-contrast
Comparison: 06/30/2012

CLINICAL DATA: Increasing dementia. Confusion. History of bilateral
breast cancer.

EXAM:
PORTABLE CHEST - 1 VIEW

[ap]
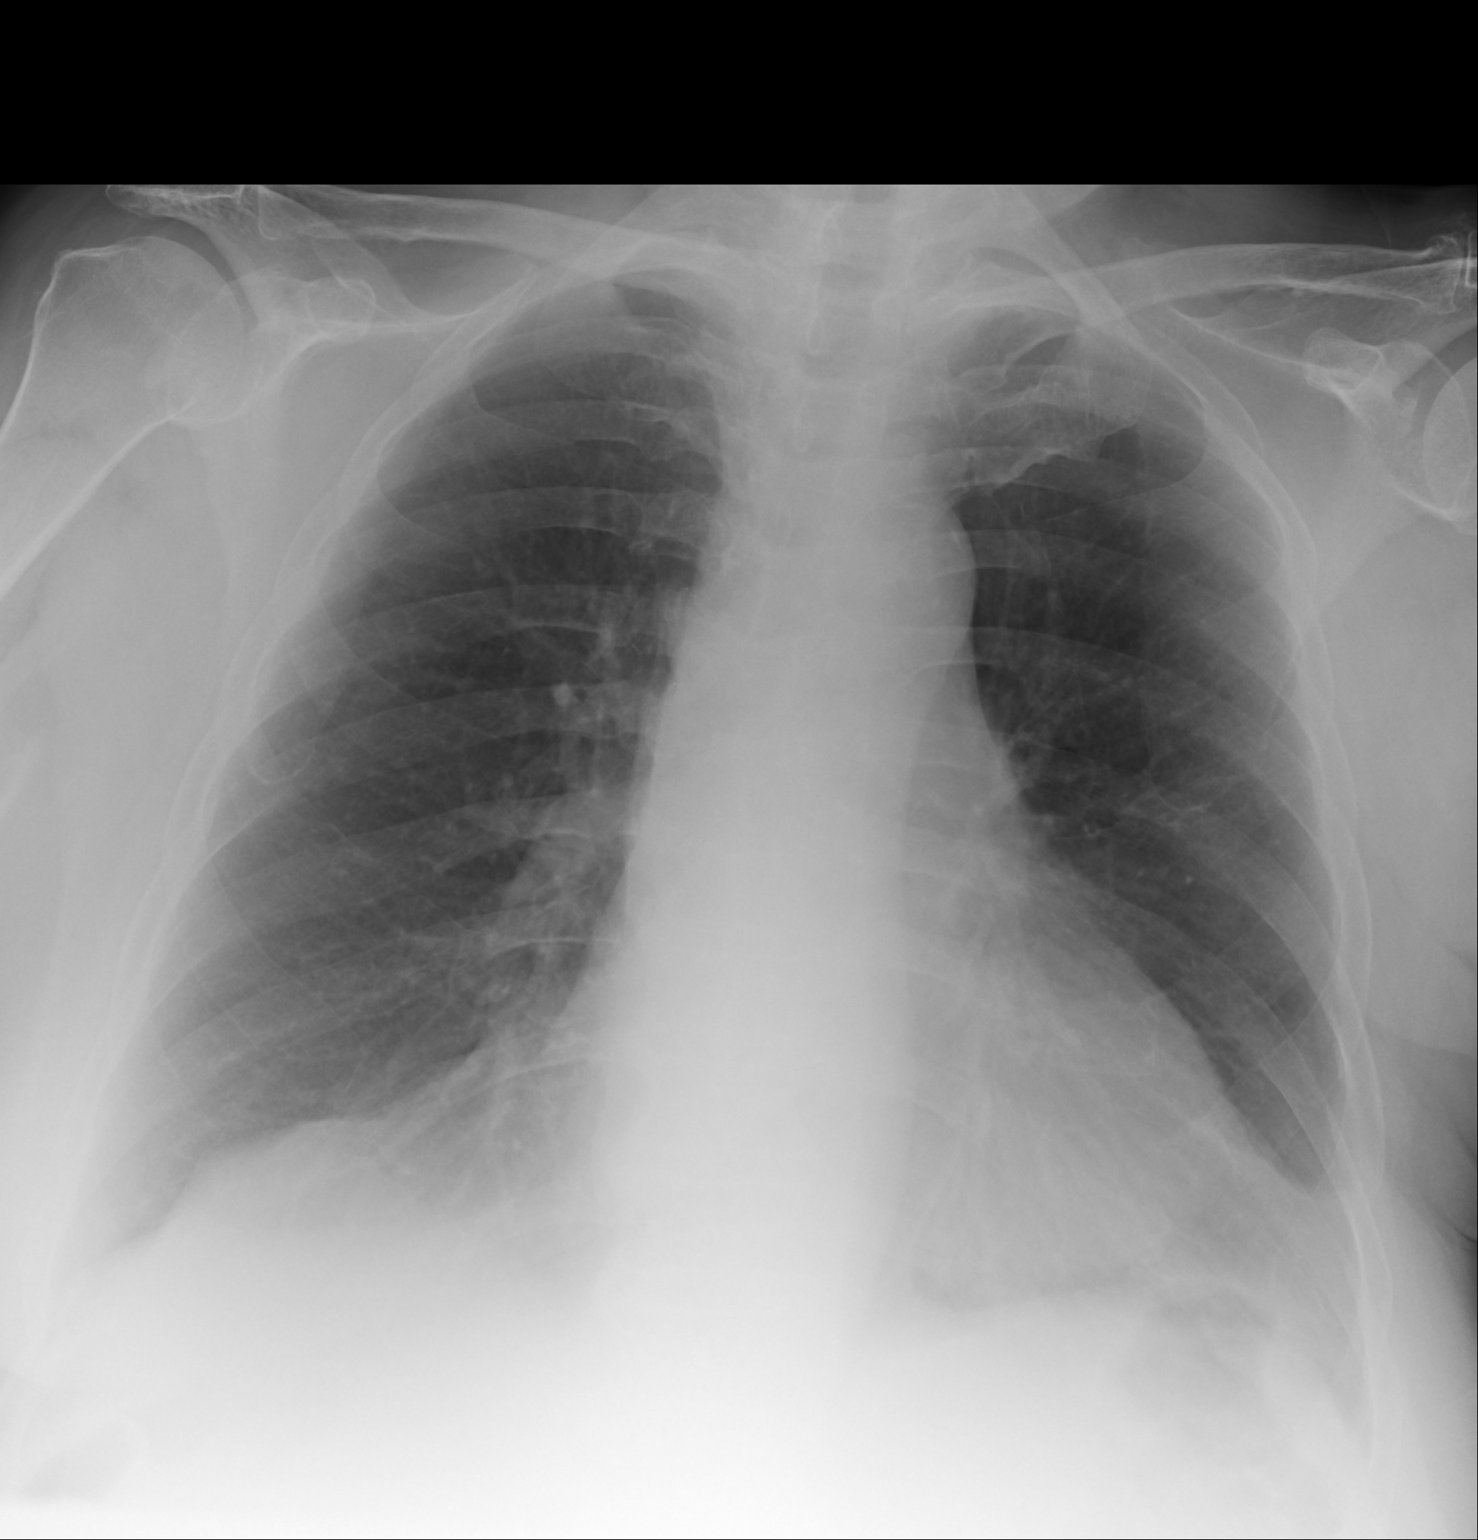

[1 of 1 positions shown; findings below may reference images not displayed]

FINDINGS: The heart size and mediastinal contours are within normal limits.
Both lungs are clear. The visualized skeletal structures are
unremarkable.
IMPRESSION: No active disease.

## 2016-01-18 ENCOUNTER — Non-Acute Institutional Stay (SKILLED_NURSING_FACILITY): Payer: Medicare Other | Admitting: Family

## 2016-01-18 DIAGNOSIS — K5901 Slow transit constipation: Secondary | ICD-10-CM

## 2016-01-18 DIAGNOSIS — F0393 Unspecified dementia, unspecified severity, with mood disturbance: Secondary | ICD-10-CM

## 2016-01-18 DIAGNOSIS — F329 Major depressive disorder, single episode, unspecified: Secondary | ICD-10-CM

## 2016-01-18 DIAGNOSIS — R339 Retention of urine, unspecified: Secondary | ICD-10-CM

## 2016-01-18 DIAGNOSIS — K219 Gastro-esophageal reflux disease without esophagitis: Secondary | ICD-10-CM | POA: Diagnosis not present

## 2016-01-18 DIAGNOSIS — R1312 Dysphagia, oropharyngeal phase: Secondary | ICD-10-CM

## 2016-01-18 DIAGNOSIS — F0280 Dementia in other diseases classified elsewhere without behavioral disturbance: Secondary | ICD-10-CM

## 2016-01-18 DIAGNOSIS — G20 Parkinson's disease: Secondary | ICD-10-CM | POA: Diagnosis not present

## 2016-01-18 NOTE — Progress Notes (Signed)
Location:    [place] of Service:  SNF (31) Provider:  Raymonde Hamblin FNP-C   Blanchie Serve, MD  Patient Care Team: Blanchie Serve, MD as PCP - General (Internal Medicine) Robert Bellow, MD (General Surgery)  Extended Emergency Contact Information Primary Emergency Contact: Schmidt,Shirley Address: [address] [place], [place] [address] Johnnette Litter of [place] Phone: [phone] Mobile Phone: [phone] Relation: Daughter  Code Status:  DNR  Goals of care: Advanced Directive information Advanced Directives 12/24/2015  Does patient have an advance directive? Yes  Type of Advance Directive Out of facility DNR (pink MOST or yellow form);Healthcare Power of Attorney  Does patient want to make changes to advanced directive? No - Patient declined  Copy of advanced directive(s) in chart? -  Pre-existing out of facility DNR order (yellow form or pink MOST form) -     Chief Complaint  Patient presents with  . Medical Management of Chronic Issues    HPI:  Pt is a 82 y.o. female seen today at [facility] for medical management of chronic diseases.She has a medical history of Parkinson's disease,Hypothyroidism, Afib, Depression, Dementia, GERD among other conditions. She is seen in her room today. She is alert but non-verbal though able to follow simple commands at times during visit. She has had no recent fall episodes, weight changes or hospital admission.Facility staff reports no new concerns.     Past Medical History:  Diagnosis Date  . Atrial fibrillation ([place])   . Cancer [facility]) 1998   left breast mastectomy chemo and radiation  . Cancer [facility]) 2014   right breast wide excision   . Carotid artery disease ([place])   . Diabetes ([place])   . DVT (deep venous thrombosis) ([place]) 1998  . Hernia   . High cholesterol   . Hypertension   . Hypothyroidism   . Parkinson disease ([place])   . Retinopathy    Past  Surgical History:  Procedure Laterality Date  . APPENDECTOMY  1949  . BREAST SURGERY Left 1998   mastectomy  . BREAST SURGERY Right 2014   wide excision, sentinel node bx, mastoplasty,MammoSite  . CARPAL TUNNEL RELEASE Bilateral 1976  . CHOLECYSTECTOMY  2007  . EYE SURGERY  1985-1986    Allergies  Allergen Reactions  . Penicillins Anaphylaxis  . Fish Allergy Hives      Medication List       Accurate as of 01/18/16  6:22 PM. Always use your most recent med list.          acetaminophen 325 MG tablet Commonly known as:  TYLENOL 2 tabs every 6 hrs prn pain or fever Do not exceed 4 gm of tylenol in 24 hours.   acetaminophen 650 MG CR tablet Commonly known as:  TYLENOL Take 650 mg by mouth 3 (three) times daily.   carbidopa-levodopa 25-100 MG tablet Commonly known as:  SINEMET IR Take 1 tablet by mouth 3 (three) times daily. For parkinsons   escitalopram 10 MG tablet Commonly known as:  LEXAPRO Take 10 mg by mouth every morning. For depression   ipratropium-albuterol 0.5-2.5 (3) MG/3ML Soln Commonly known as:  DUONEB Take 3 mLs by nebulization every 6 (six) hours as needed. For shortness of breath and wheezing   morphine 20 MG/ML concentrated solution Commonly known as:  ROXANOL Give 0.49ml by by mouth every 6 hour as needed for dyspnea, and every hour  for pain.   OXYGEN Inhale 2 L into the lungs.   primidone 50 MG tablet Commonly known as:  MYSOLINE Take 50 mg by mouth 2 (two) times daily. For tremors   QUEtiapine 25 MG tablet Commonly known as:  SEROQUEL Take 25 mg by mouth daily. For psychosis   QUEtiapine 50 MG tablet Commonly known as:  SEROQUEL Take 50 mg by mouth at bedtime. For psychosis   ranitidine 150 MG tablet Commonly known as:  ZANTAC Take 150 mg by mouth daily. For gerd   sennosides-docusate sodium 8.6-50 MG tablet Commonly known as:  SENOKOT-S Take 2 tablets by mouth at bedtime. For constipation   UNABLE TO FIND Med Name: Med Pass :  Give 120 ml of honey thick fluid of choice daily with Med pass for additional hydration.       Review of Systems  Unable to perform ROS: Dementia    Immunization History  Administered Date(s) Administered  . Influenza-Unspecified 03/24/2014, 04/01/2015  . PPD Test 03/20/2014, 03/20/2014, 04/06/2014, 04/06/2014, 02/18/2015, 02/18/2015  . Pneumococcal Conjugate-13 04/23/2015  . Pneumococcal-Unspecified 03/19/2014   Pertinent  Health Maintenance Due  Topic Date Due  . INFLUENZA VACCINE  01/12/2016  . DEXA SCAN  06/14/2023 (Originally 10/18/1996)  . PNA vac Low Risk Adult  Completed      Vitals:   01/18/16 1045  BP: 101/62  Pulse: 61  Resp: 18  Temp: (!) 96.6 F (35.9 C)  SpO2: 97%  Weight: 136 lb 3.2 oz (61.8 kg)  Height: 5\' 3"  (1.6 m)   Body mass index is 24.13 kg/m. Physical Exam  Constitutional: She appears well-developed and well-nourished. No distress.  Elderly alert but non-verbal though follows simple commands at times.  HENT:  Head: Normocephalic.  Mouth/Throat: Oropharynx is clear and moist. No oropharyngeal exudate.  Eyes: Conjunctivae and EOM are normal. Pupils are equal, round, and reactive to light. Right eye exhibits no discharge. Left eye exhibits no discharge. No scleral icterus.  Neck: Normal range of motion. No JVD present.  Cardiovascular: Normal rate, regular rhythm, normal heart sounds and intact distal pulses.  Exam reveals no gallop and no friction rub.   No murmur heard. Pulmonary/Chest: Effort normal and breath sounds normal. No respiratory distress. She has no wheezes. She has no rales.  Abdominal: Soft. Bowel sounds are normal. She exhibits no distension. There is no tenderness. There is no rebound and no guarding.  Genitourinary:  Genitourinary Comments: Foley Catheter draining aqeduate amounts yellow clear urine.   Musculoskeletal: She exhibits no edema or tenderness.  Right hand contracture.   Lymphadenopathy:    She has no cervical  adenopathy.  Neurological: She is alert.  Non-verbal   Skin: Skin is warm and dry. No rash noted. No erythema. No pallor.  Psychiatric: She has a normal mood and affect.    Labs reviewed:  Recent Labs  11/20/15 11/24/15  NA  --  140  K 4.7  --   BUN  --  21  CREATININE  --  0.8    Recent Labs  11/24/15  ALT 9  ALKPHOS 80    Recent Labs  11/24/15  WBC 9.2  HGB 10.8*  HCT 36   Lab Results  Component Value Date   TSH 5.40 03/18/2015   Assessment/Plan 1. Dysphagia Continue to assist with all meals. Aspiration precaution.   2. Parkinson's disease ([place]) Progressive decline. Continue on Sinemet IR 25-100 mg Tablet three times daily and Primidone 50 mg twice daily. Continue to assist with  ADL's.   3. Depression due to dementia Continue on Lexapro 10 mg tablet.   4. Urinary retention Has indwelling foley catheter. Encourage fluid intake. Foley care per facility protocol.   5. Gastroesophageal reflux disease without esophagitis Continue on Ranitidine 150 mg daily.   6. Slow transit constipation Current regimen effective. Continue to encourage oral and fluid intake.    Family/ staff Communication: Reviewed plan of care with facility Nurse supervisor  Labs/tests ordered: None

## 2016-02-04 ENCOUNTER — Non-Acute Institutional Stay (SKILLED_NURSING_FACILITY): Payer: Medicare Other | Admitting: Family

## 2016-02-04 ENCOUNTER — Encounter: Payer: Self-pay | Admitting: Family

## 2016-02-04 DIAGNOSIS — N3001 Acute cystitis with hematuria: Secondary | ICD-10-CM

## 2016-02-04 NOTE — Progress Notes (Signed)
Location:   [place] Room Number: [id number] of Service:  SNF (31) Provider:  Marlowe Sax, FNP-C  Blanchie Serve, MD  Patient Care Team: Blanchie Serve, MD as PCP - General (Internal Medicine) Robert Bellow, MD (General Surgery)  Extended Emergency Contact Information Primary Emergency Contact: Schmidt,Shirley Address: [address] [place], [place] [address] Johnnette Litter of [place] Phone: [phone] Mobile Phone: [phone] Relation: Daughter  Code Status:  DNR Goals of care: Advanced Directive information Advanced Directives 12/24/2015  Does patient have an advance directive? Yes  Type of Advance Directive Out of facility DNR (pink MOST or yellow form);Healthcare Power of Attorney  Does patient want to make changes to advanced directive? No - Patient declined  Copy of advanced directive(s) in chart? -  Pre-existing out of facility DNR order (yellow form or pink MOST form) -     Chief Complaint  Patient presents with  . Acute Visit    UTI    HPI:  Pt is a 82 y.o. female seen today at [facility]  for an acute visit for abnormal lab results. She is seen in her room today. She is unable to provide HPI and ROS due to non-verbal. Her recent urine lab results is positive for trace blood, Nitrites and large Leukocytes. Urine cultures showed > 100,000 colonies of Klebsiella Pneumoniae and Providencia stuartii sensitivity to rocephin.    Past Medical History:  Diagnosis Date  . Atrial fibrillation ([place])   . Cancer [facility]) 1998   left breast mastectomy chemo and radiation  . Cancer [facility]) 2014   right breast wide excision   . Carotid artery disease ([place])   . Diabetes ([place])   . DVT (deep venous thrombosis) ([place]) 1998  . Hernia   . High cholesterol   . Hypertension   . Hypothyroidism   . Parkinson disease ([place])   . Retinopathy    Past Surgical History:  Procedure Laterality Date  .  APPENDECTOMY  1949  . BREAST SURGERY Left 1998   mastectomy  . BREAST SURGERY Right 2014   wide excision, sentinel node bx, mastoplasty,MammoSite  . CARPAL TUNNEL RELEASE Bilateral 1976  . CHOLECYSTECTOMY  2007  . EYE SURGERY  1985-1986    Allergies  Allergen Reactions  . Penicillins Anaphylaxis  . Shellfish Allergy   . Fish Allergy Hives      Medication List       Accurate as of 02/04/16 12:31 PM. Always use your most recent med list.          acetaminophen 650 MG CR tablet Commonly known as:  TYLENOL Take 650 mg by mouth 3 (three) times daily.   acetaminophen 325 MG tablet Commonly known as:  TYLENOL Take 650 mg by mouth every 6 (six) hours as needed.   carbidopa-levodopa 25-100 MG tablet Commonly known as:  SINEMET IR Take 1 tablet by mouth 3 (three) times daily. For parkinsons   DECUBI-VITE PO Take one tablet by mouth once daily   escitalopram 10 MG tablet Commonly known as:  LEXAPRO Take 10 mg by mouth every morning. For depression   ipratropium-albuterol 0.5-2.5 (3) MG/3ML Soln Commonly known as:  DUONEB Take 3 mLs by nebulization every 6 (six) hours as needed. For shortness of breath and wheezing   morphine 20 MG/ML concentrated solution Commonly known as:  ROXANOL Give 0.1ml by by mouth every 6 hour as needed for  dyspnea, and every hour for pain.   OXYGEN Inhale 2 L into the lungs.   primidone 50 MG tablet Commonly known as:  MYSOLINE Take 50 mg by mouth 2 (two) times daily. For tremors   QUEtiapine 25 MG tablet Commonly known as:  SEROQUEL Take 25 mg by mouth daily. For psychosis   QUEtiapine 50 MG tablet Commonly known as:  SEROQUEL Take 50 mg by mouth at bedtime. For psychosis   ranitidine 150 MG tablet Commonly known as:  ZANTAC Take 150 mg by mouth daily. For gerd   sennosides-docusate sodium 8.6-50 MG tablet Commonly known as:  SENOKOT-S Take 2 tablets by mouth at bedtime. For constipation   UNABLE TO FIND Med Name: Med Pass :  Give 120 ml of honey thick fluid of choice daily with Med pass for additional hydration.       Review of Systems  Unable to perform ROS: Dementia    Immunization History  Administered Date(s) Administered  . Influenza-Unspecified 03/24/2014, 04/01/2015  . PPD Test 03/20/2014, 03/20/2014, 04/06/2014, 04/06/2014, 02/18/2015, 02/18/2015  . Pneumococcal Conjugate-13 04/23/2015  . Pneumococcal-Unspecified 03/19/2014   Pertinent  Health Maintenance Due  Topic Date Due  . INFLUENZA VACCINE  01/12/2016  . DEXA SCAN  06/14/2023 (Originally 10/18/1996)  . PNA vac Low Risk Adult  Completed      Vitals:   02/04/16 1217  BP: 136/78  Pulse: 76  Resp: 20  Temp: 97.3 F (36.3 C)  TempSrc: Oral  SpO2: 98%  Weight: 136 lb (61.7 kg)  Height: 5\' 3"  (1.6 m)   Body mass index is 24.09 kg/m. Physical Exam  Constitutional: She appears well-developed and well-nourished. No distress.  Elderly in no acute distress   HENT:  Head: Normocephalic.  Mouth/Throat: Oropharynx is clear and moist. No oropharyngeal exudate.  Eyes: Conjunctivae and EOM are normal. Pupils are equal, round, and reactive to light. Right eye exhibits no discharge. Left eye exhibits no discharge. No scleral icterus.  Neck: Normal range of motion. No JVD present.  Cardiovascular: Normal rate, regular rhythm, normal heart sounds and intact distal pulses.  Exam reveals no gallop and no friction rub.   No murmur heard. Pulmonary/Chest: Effort normal and breath sounds normal. No respiratory distress. She has no wheezes. She has no rales.  Abdominal: Soft. Bowel sounds are normal. She exhibits no distension. There is no tenderness. There is no rebound and no guarding.  Genitourinary:  Genitourinary Comments: Foley Catheter draining aqeduate amounts of urine cloudy in color.   Musculoskeletal: She exhibits no edema or tenderness.  Right hand contracture.   Lymphadenopathy:    She has no cervical adenopathy.  Neurological: She is  alert.  Non-verbal   Skin: Skin is warm and dry. No rash noted. No erythema. No pallor.  Psychiatric: She has a normal mood and affect.    Labs reviewed:  Recent Labs  11/20/15 11/24/15  NA  --  140  K 4.7  --   BUN  --  21  CREATININE  --  0.8    Recent Labs  11/24/15  ALT 9  ALKPHOS 80    Recent Labs  11/24/15  WBC 9.2  HGB 10.8*  HCT 36   Lab Results  Component Value Date   TSH 5.40 03/18/2015   Assessment/Plan  Urinary Tract infection  positive for trace blood, Nitrites and large Leukocytes. Urine cultures showed > 100,000 colonies of Klebsiella Pneumoniae and Providencia stuartii sensitivity to rocephin.Give Rocephin 1 G I.M daily X 3 days  Mix with lidocaine for comfort.Florastor 250 mg Tablet twice daily X 7 days.     Family/ staff Communication: Reviewed plan of care with facility Nurse supervisor.   Labs/tests ordered: None

## 2016-02-17 ENCOUNTER — Non-Acute Institutional Stay (SKILLED_NURSING_FACILITY): Payer: Medicare Other | Admitting: Family

## 2016-02-17 DIAGNOSIS — G3183 Dementia with Lewy bodies: Secondary | ICD-10-CM | POA: Diagnosis not present

## 2016-02-17 DIAGNOSIS — K219 Gastro-esophageal reflux disease without esophagitis: Secondary | ICD-10-CM

## 2016-02-17 DIAGNOSIS — R339 Retention of urine, unspecified: Secondary | ICD-10-CM

## 2016-02-17 DIAGNOSIS — R1310 Dysphagia, unspecified: Secondary | ICD-10-CM | POA: Diagnosis not present

## 2016-02-17 DIAGNOSIS — E46 Unspecified protein-calorie malnutrition: Secondary | ICD-10-CM | POA: Diagnosis not present

## 2016-02-17 DIAGNOSIS — F0280 Dementia in other diseases classified elsewhere without behavioral disturbance: Secondary | ICD-10-CM

## 2016-02-17 DIAGNOSIS — K5901 Slow transit constipation: Secondary | ICD-10-CM | POA: Diagnosis not present

## 2016-02-17 NOTE — Progress Notes (Signed)
Location:   [place] Room Number: [place] of Service:  SNF (31) Provider:  Joeanne Robicheaux FNP-C   Blanchie Serve, MD  Patient Care Team: Blanchie Serve, MD as PCP - General (Internal Medicine) Robert Bellow, MD (General Surgery)  Extended Emergency Contact Information Primary Emergency Contact: Schmidt,Shirley Address: [address] [place], [place] [address] Johnnette Litter of [place] Phone: [phone] Mobile Phone: [phone] Relation: Daughter  Code Status: DNR  Goals of care: Advanced Directive information Advanced Directives 12/24/2015  Does patient have an advance directive? Yes  Type of Advance Directive Out of facility DNR (pink MOST or yellow form);Healthcare Power of Attorney  Does patient want to make changes to advanced directive? No - Patient declined  Copy of advanced directive(s) in chart? -  Pre-existing out of facility DNR order (yellow form or pink MOST form) -     Chief Complaint  Patient presents with  . Medical Management of Chronic Issues    HPI:  Pt is a 82 y.o. female seen today at [facility] for medical management of chronic diseases.She is seen in her room today. She is alert but non-verbal though follows some commands at times. She was treated with Rocephin  1 gm I.M. X 3 days 02/04/2016 for UTI. She responded well. Facility staff reports no new concerns. She continues to follow up with Hospice service for decline in condition. No recent hospital admission or fall episodes reported. She unable to Provide HPI or ROS     Past Medical History:  Diagnosis Date  . Atrial fibrillation ([place])   . Cancer [facility]) 1998   left breast mastectomy chemo and radiation  . Cancer [facility]) 2014   right breast wide excision   . Carotid artery disease ([place])   . Diabetes ([place])   . DVT (deep venous thrombosis) ([place]) 1998  . Hernia   . High cholesterol   . Hypertension   . Hypothyroidism    . Parkinson disease ([place])   . Retinopathy    Past Surgical History:  Procedure Laterality Date  . APPENDECTOMY  1949  . BREAST SURGERY Left 1998   mastectomy  . BREAST SURGERY Right 2014   wide excision, sentinel node bx, mastoplasty,MammoSite  . CARPAL TUNNEL RELEASE Bilateral 1976  . CHOLECYSTECTOMY  2007  . EYE SURGERY  1985-1986    Allergies  Allergen Reactions  . Penicillins Anaphylaxis  . Shellfish Allergy   . Fish Allergy Hives      Medication List       Accurate as of 02/17/16  6:44 PM. Always use your most recent med list.          acetaminophen 650 MG CR tablet Commonly known as:  TYLENOL Take 650 mg by mouth 3 (three) times daily.   acetaminophen 325 MG tablet Commonly known as:  TYLENOL Take 650 mg by mouth every 6 (six) hours as needed.   carbidopa-levodopa 25-100 MG tablet Commonly known as:  SINEMET IR Take 1 tablet by mouth 3 (three) times daily. For parkinsons   escitalopram 10 MG tablet Commonly known as:  LEXAPRO Take 10 mg by mouth every morning. For depression   ipratropium-albuterol 0.5-2.5 (3) MG/3ML Soln Commonly known as:  DUONEB Take 3 mLs by nebulization every 6 (six) hours as needed. For shortness of breath and wheezing   morphine 20 MG/ML concentrated solution Commonly known as:  ROXANOL  Give 0.68ml by by mouth every 6 hour as needed for dyspnea, and every hour for pain.   OXYGEN Inhale 2 L into the lungs.   primidone 50 MG tablet Commonly known as:  MYSOLINE Take 50 mg by mouth 2 (two) times daily. For tremors   QUEtiapine 25 MG tablet Commonly known as:  SEROQUEL Take 25 mg by mouth daily. For psychosis   QUEtiapine 50 MG tablet Commonly known as:  SEROQUEL Take 50 mg by mouth at bedtime. For psychosis   ranitidine 150 MG tablet Commonly known as:  ZANTAC Take 150 mg by mouth daily. For gerd   sennosides-docusate sodium 8.6-50 MG tablet Commonly known as:  SENOKOT-S Take 2 tablets by mouth at bedtime. For  constipation   UNABLE TO FIND Med Name: Med Pass : Give 120 ml of honey thick fluid of choice daily with Med pass for additional hydration.       Review of Systems  Unable to perform ROS: Dementia    Immunization History  Administered Date(s) Administered  . Influenza-Unspecified 03/24/2014, 04/01/2015  . PPD Test 03/20/2014, 03/20/2014, 04/06/2014, 04/06/2014, 02/18/2015, 02/18/2015  . Pneumococcal Conjugate-13 04/23/2015  . Pneumococcal-Unspecified 03/19/2014   Pertinent  Health Maintenance Due  Topic Date Due  . INFLUENZA VACCINE  01/12/2016  . DEXA SCAN  06/14/2023 (Originally 10/18/1996)  . PNA vac Low Risk Adult  Completed   No flowsheet data found. Functional Status Survey:    Vitals:   02/17/16 1145  BP: 132/65  Pulse: 60  Resp: 20  Temp: 97.5 F (36.4 C)  SpO2: 97%  Weight: 135 lb (61.2 kg)  Height: 5\' 3"  (1.6 m)   Body mass index is 23.91 kg/m. Physical Exam  Constitutional: She appears well-developed and well-nourished. No distress.  Elderly quiet during visit but follows simple commands at times.   HENT:  Head: Normocephalic.  Mouth/Throat: Oropharynx is clear and moist. No oropharyngeal exudate.  Eyes: Conjunctivae and EOM are normal. Pupils are equal, round, and reactive to light. Right eye exhibits no discharge. Left eye exhibits no discharge. No scleral icterus.  Neck: Normal range of motion. No JVD present.  Cardiovascular: Normal rate, regular rhythm, normal heart sounds and intact distal pulses.  Exam reveals no gallop and no friction rub.   No murmur heard. Pulmonary/Chest: Effort normal and breath sounds normal. No respiratory distress. She has no wheezes. She has no rales.  Abdominal: Soft. Bowel sounds are normal. She exhibits no distension. There is no tenderness. There is no rebound and no guarding.  Genitourinary:  Genitourinary Comments: Foley Catheter draining aqeduate amounts of yellow urine.     Musculoskeletal: She exhibits no edema  or tenderness.  Right hand contracture.   Lymphadenopathy:    She has no cervical adenopathy.  Neurological: She is alert.  Non-verbal   Skin: Skin is warm and dry. No rash noted. No erythema. No pallor.  Bilateral Long hand sleeves in place   Psychiatric: She has a normal mood and affect.    Labs reviewed:  Recent Labs  11/20/15 11/24/15  NA  --  140  K 4.7  --   BUN  --  21  CREATININE  --  0.8    Recent Labs  11/24/15  ALT 9  ALKPHOS 80    Recent Labs  11/24/15  WBC 9.2  HGB 10.8*  HCT 36   Lab Results  Component Value Date   TSH 5.40 03/18/2015   Assessment/Plan 1. Parkinson's disease, Lewy body ([place]) Progressive decline.continue  on Sinemet 25 -100 mg Tablet. Continue on Hospice services. Continue to assist with ADL's. Skin Care.   2. Dysphagia Continue to assist with feeding. Aspiration precautions.   3. Slow transit constipation Current regimen effective. Continue to monitor.   4. Gastroesophageal reflux disease without esophagitis Stable. Continue on Zantac.   5. Protein-calorie malnutrition ([place]) Has had one pound weight loss over three months.Continue on Protein supplements. Monitor BMP   6. Urinary retention Treated 8/24 /2017 for UTI tolerated antibiotics well. Continue Foley Care per facility Protocol.     Family/ staff Communication:Reviewed plan of care with Facility Nurse supervisor and Hospice Nurse.  Labs/tests ordered: None

## 2016-03-21 ENCOUNTER — Encounter: Payer: Self-pay | Admitting: Internal Medicine

## 2016-03-21 ENCOUNTER — Non-Acute Institutional Stay (SKILLED_NURSING_FACILITY): Payer: Medicare Other | Admitting: Internal Medicine

## 2016-03-21 DIAGNOSIS — K5909 Other constipation: Secondary | ICD-10-CM

## 2016-03-21 DIAGNOSIS — R339 Retention of urine, unspecified: Secondary | ICD-10-CM | POA: Diagnosis not present

## 2016-03-21 DIAGNOSIS — G20 Parkinson's disease: Secondary | ICD-10-CM | POA: Diagnosis not present

## 2016-03-21 DIAGNOSIS — M159 Polyosteoarthritis, unspecified: Secondary | ICD-10-CM

## 2016-03-21 DIAGNOSIS — J9611 Chronic respiratory failure with hypoxia: Secondary | ICD-10-CM

## 2016-03-21 DIAGNOSIS — D638 Anemia in other chronic diseases classified elsewhere: Secondary | ICD-10-CM | POA: Diagnosis not present

## 2016-03-21 DIAGNOSIS — D692 Other nonthrombocytopenic purpura: Secondary | ICD-10-CM

## 2016-03-21 NOTE — Progress Notes (Signed)
Patient ID: Kristin Frey, female   DOB: [birth year], 82 y.o.   MRN: [id number]     Facility: [facility]   Code status: DNR  Chief Complaint  Patient presents with  . Medical Management of Chronic Issues    Routine Visit    Allergies  Allergen Reactions  . Penicillins Anaphylaxis  . Shellfish Allergy   . Fish Allergy Hives   Advanced Directives 12/24/2015  Does patient have an advance directive? Yes  Type of Advance Directive Out of facility DNR (pink MOST or yellow form);Healthcare Power of Attorney  Does patient want to make changes to advanced directive? No - Patient declined  Copy of advanced directive(s) in chart? -  Pre-existing out of facility DNR order (yellow form or pink MOST form) -    HPI 82 y/o female pt is seen for routine visit. No new concern from nursing staff this visit. She has to be fed and is under total care. She is compliant with her medications. No fall reported. she is OOB daily. No pressure ulcer. No new behavioral changes reported. Her foley catheter is changed on monthly basis.   ROS Unable to obtain  Past Medical History:  Diagnosis Date  . Atrial fibrillation ([place])   . Cancer [facility]) 1998   left breast mastectomy chemo and radiation  . Cancer [facility]) 2014   right breast wide excision   . Carotid artery disease ([place])   . Diabetes ([place])   . DVT (deep venous thrombosis) ([place]) 1998  . Hernia   . High cholesterol   . Hypertension   . Hypothyroidism   . Parkinson disease ([place])   . Retinopathy      Medication List       Accurate as of 03/21/16  1:00 PM. Always use your most recent med list.          acetaminophen 650 MG CR tablet Commonly known as:  TYLENOL Take 650 mg by mouth 3 (three) times daily.   acetaminophen 325 MG tablet Commonly known as:  TYLENOL Take 650 mg by mouth every 6 (six) hours as needed.   carbidopa-levodopa 25-100 MG tablet Commonly known as:  SINEMET IR Take 1 tablet by mouth 3  (three) times daily. For parkinsons   escitalopram 10 MG tablet Commonly known as:  LEXAPRO Take 10 mg by mouth every morning. For depression   ipratropium-albuterol 0.5-2.5 (3) MG/3ML Soln Commonly known as:  DUONEB Take 3 mLs by nebulization every 6 (six) hours as needed. For shortness of breath and wheezing   OXYGEN Inhale 2 L into the lungs.   primidone 50 MG tablet Commonly known as:  MYSOLINE Take 50 mg by mouth 2 (two) times daily. For tremors   Propylene Glycol 0.6 % Soln Apply 1 drop to eye 2 (two) times daily.   QUEtiapine 25 MG tablet Commonly known as:  SEROQUEL Take 25 mg by mouth daily. For psychosis   QUEtiapine 50 MG tablet Commonly known as:  SEROQUEL Take 50 mg by mouth at bedtime. For psychosis   ranitidine 150 MG tablet Commonly known as:  ZANTAC Take 150 mg by mouth daily. For gerd   sennosides-docusate sodium 8.6-50 MG tablet Commonly known as:  SENOKOT-S Take 1 tablet by mouth at bedtime. For constipation   UNABLE TO FIND Med Name: Med Pass : Give 120 ml of honey thick fluid of choice daily with Med pass for additional hydration.      Physical exam BP 134/78  Pulse 76   Temp 97.9 F (36.6 C) (Oral)   Resp 20   Ht 5\' 3"  (1.6 m)   Wt 131 lb 6.4 oz (59.6 kg)   SpO2 98%   BMI 23.28 kg/m   Wt Readings from Last 3 Encounters:  03/21/16 131 lb 6.4 oz (59.6 kg)  02/17/16 135 lb (61.2 kg)  02/04/16 136 lb (61.7 kg)   General- elderly female in no acute distress, non verbal Head- atraumatic, normocephalic Eyes- no pallor, no icterus, no discharge Mouth- normal mucus membrane Cardiovascular- normal s1,s2, no murmur Respiratory- bilateral clear to auscultation Abdomen- bowel sounds present, soft, non tender, foley catheter in place Musculoskeletal- able to move all 4 extremities, trace leg edema Neurological- resting hand tremors Skin- warm and dry, gerisleeve present, senile purpura present    Labs CBC Latest Ref Rng & Units  11/24/2015 12/18/2014 05/22/2014  WBC 10:3/mL 9.2 9.7 8.1  Hemoglobin 12.0 - 16.0 g/dL 10.8(A) 10.7(A) 10.9(A)  Hematocrit 36 - 46 % 36 33(A) 35(A)  Platelets 150 - 399 K/L - 302 209   CMP Latest Ref Rng & Units 11/24/2015 11/20/2015 12/18/2014  Glucose 65 - 99 mg/dL - - -  BUN 4 - 21 mg/dL 21 - 19  Creatinine 0.5 - 1.1 mg/dL 0.8 - 0.7  Sodium 137 - 147 mmol/L 140 - 141  Potassium 3.4 - 5.3 mmol/L - 4.7 4.5  Chloride 98 - 107 mmol/L - - -  CO2 21 - 32 mmol/L - - -  Calcium 8.5 - 10.1 mg/dL - - -  Total Protein 6.4 - 8.2 g/dL - - -  Total Bilirubin 0.2 - 1.0 mg/dL - - -  Alkaline Phos 25 - 125 U/L 80 - 57  AST 13 - 35 U/L - - 14  ALT 7 - 35 U/L 9 - 8     Assessment/plan  Anemia of chronic disease Stable but low Hb, monitor  Generalized osteoarthritis Continue tylenol 650 mg tid  Parkinson's disease Continue sinemet IR 25-100 mg tid, provide supportive care, fall precautions. Continue primidone to help with tremors  Urinary retention Chronic, has indwelling foley catheter. Monitor urine output. Continue foley care. Maintain hydratrion  Senile purpura No skin tear, wound reported. Continue skin care. Keep skin moisturized  Chronic constipation Continue senokot s  Chronic respiratory failure Breathing currently stable. Continue oxygen by nasal canula, bronchodilator rx   Blanchie Serve, MD Internal Medicine [facility] [address] [place], [place] [address] Cell Phone (Monday-Friday 8 am - 5 pm): [phone] On Call: [phone] and follow prompts after 5 pm and on weekends Office Phone: [phone] Office Fax: [phone]

## 2016-04-10 DIAGNOSIS — D638 Anemia in other chronic diseases classified elsewhere: Secondary | ICD-10-CM | POA: Insufficient documentation

## 2016-04-10 DIAGNOSIS — M159 Polyosteoarthritis, unspecified: Secondary | ICD-10-CM | POA: Insufficient documentation

## 2016-04-10 DIAGNOSIS — D692 Other nonthrombocytopenic purpura: Secondary | ICD-10-CM | POA: Insufficient documentation

## 2016-04-24 ENCOUNTER — Non-Acute Institutional Stay (SKILLED_NURSING_FACILITY): Payer: Medicare Other | Admitting: Family

## 2016-04-24 DIAGNOSIS — R269 Unspecified abnormalities of gait and mobility: Secondary | ICD-10-CM

## 2016-04-24 DIAGNOSIS — K219 Gastro-esophageal reflux disease without esophagitis: Secondary | ICD-10-CM | POA: Diagnosis not present

## 2016-04-24 DIAGNOSIS — G3183 Dementia with Lewy bodies: Secondary | ICD-10-CM | POA: Diagnosis not present

## 2016-04-24 DIAGNOSIS — M159 Polyosteoarthritis, unspecified: Secondary | ICD-10-CM

## 2016-04-24 DIAGNOSIS — K5901 Slow transit constipation: Secondary | ICD-10-CM

## 2016-04-24 NOTE — Progress Notes (Signed)
Location:  [place] Room Number: [place] of Service:  SNF (31) Provider:  Ludell Zacarias FNP-C   Blanchie Serve, MD  Patient Care Team: Blanchie Serve, MD as PCP - General (Internal Medicine) Robert Bellow, MD (General Surgery)  Extended Emergency Contact Information Primary Emergency Contact: Schmidt,Shirley Address: [address] [place], [place] [address] Johnnette Litter of [place] Phone: [phone] Mobile Phone: [phone] Relation: Daughter  Code Status:  DNR  Goals of care: Advanced Directive information Advanced Directives 12/24/2015  Does patient have an advance directive? Yes  Type of Advance Directive Out of facility DNR (pink MOST or yellow form);Healthcare Power of Attorney  Does patient want to make changes to advanced directive? No - Patient declined  Copy of advanced directive(s) in chart? -  Pre-existing out of facility DNR order (yellow form or pink MOST form) -     Chief Complaint  Patient presents with  . Medical Management of Chronic Issues    HPI:  Pt is a 82 y.o. female seen today for medical management of chronic diseases.She has a medical history of Parkinson's disease, Dementia,OA,Afib among other conditions. She is seen in her room today. She is nonverbal but follows commands. No recent fall episodes or hospital admission. Skin intact. Facility staff reports no concerns.    Past Medical History:  Diagnosis Date  . Atrial fibrillation ([place])   . Cancer [facility]) 1998   left breast mastectomy chemo and radiation  . Cancer [facility]) 2014   right breast wide excision   . Carotid artery disease ([place])   . Diabetes ([place])   . DVT (deep venous thrombosis) ([place]) 1998  . Hernia   . High cholesterol   . Hypertension   . Hypothyroidism   . Parkinson disease ([place])   . Retinopathy    Past Surgical History:  Procedure Laterality Date  . APPENDECTOMY  1949  . BREAST SURGERY Left 1998   mastectomy    . BREAST SURGERY Right 2014   wide excision, sentinel node bx, mastoplasty,MammoSite  . CARPAL TUNNEL RELEASE Bilateral 1976  . CHOLECYSTECTOMY  2007  . EYE SURGERY  1985-1986    Allergies  Allergen Reactions  . Penicillins Anaphylaxis  . Shellfish Allergy   . Fish Allergy Hives      Medication List       Accurate as of 04/24/16 10:45 AM. Always use your most recent med list.          acetaminophen 650 MG CR tablet Commonly known as:  TYLENOL Take 650 mg by mouth 3 (three) times daily.   acetaminophen 325 MG tablet Commonly known as:  TYLENOL Take 650 mg by mouth every 6 (six) hours as needed.   carbidopa-levodopa 25-100 MG tablet Commonly known as:  SINEMET IR Take 1 tablet by mouth 3 (three) times daily. For parkinsons   escitalopram 10 MG tablet Commonly known as:  LEXAPRO Take 10 mg by mouth every morning. For depression   ipratropium-albuterol 0.5-2.5 (3) MG/3ML Soln Commonly known as:  DUONEB Take 3 mLs by nebulization every 6 (six) hours as needed. For shortness of breath and wheezing   OXYGEN Inhale 2 L into the lungs.   primidone 50 MG tablet Commonly known as:  MYSOLINE Take 50 mg by mouth 2 (two) times daily. For tremors   Propylene Glycol 0.6 % Soln Apply 1 drop to eye 2 (two) times daily.   QUEtiapine  25 MG tablet Commonly known as:  SEROQUEL Take 25 mg by mouth daily. For psychosis   QUEtiapine 50 MG tablet Commonly known as:  SEROQUEL Take 50 mg by mouth at bedtime. For psychosis   ranitidine 150 MG tablet Commonly known as:  ZANTAC Take 150 mg by mouth daily. For gerd   sennosides-docusate sodium 8.6-50 MG tablet Commonly known as:  SENOKOT-S Take 1 tablet by mouth at bedtime. For constipation   UNABLE TO FIND Med Name: Med Pass : Give 120 ml of honey thick fluid of choice daily with Med pass for additional hydration.       Review of Systems  Unable to perform ROS: Dementia    Immunization History  Administered Date(s)  Administered  . Influenza-Unspecified 03/24/2014, 04/01/2015  . PPD Test 03/20/2014, 03/20/2014, 04/06/2014, 04/06/2014, 02/18/2015, 02/18/2015  . Pneumococcal Conjugate-13 04/23/2015  . Pneumococcal-Unspecified 03/19/2014   Pertinent  Health Maintenance Due  Topic Date Due  . INFLUENZA VACCINE  01/12/2016  . DEXA SCAN  06/14/2023 (Originally 10/18/1996)  . PNA vac Low Risk Adult  Completed   No flowsheet data found. Functional Status Survey:    Vitals:   04/18/16 1030  BP: 120/70  Pulse: 60  Resp: 20  Temp: 97.5 F (36.4 C)  SpO2: 97%  Weight: 131 lb 6.4 oz (59.6 kg)  Height: 5\' 3"  (1.6 m)   Body mass index is 23.28 kg/m. Physical Exam  Constitutional: She appears well-developed and well-nourished. No distress.  Nonverbal but follows commands.   HENT:  Head: Normocephalic.  Mouth/Throat: Oropharynx is clear and moist. No oropharyngeal exudate.  Eyes: Conjunctivae and EOM are normal. Pupils are equal, round, and reactive to light. Right eye exhibits no discharge. Left eye exhibits no discharge. No scleral icterus.  Neck: Normal range of motion. No JVD present.  Cardiovascular: Normal rate, regular rhythm, normal heart sounds and intact distal pulses.  Exam reveals no gallop and no friction rub.   No murmur heard. Pulmonary/Chest: Effort normal and breath sounds normal. No respiratory distress. She has no wheezes. She has no rales.  Abdominal: Soft. Bowel sounds are normal. She exhibits no distension. There is no tenderness. There is no rebound and no guarding.  Genitourinary:  Genitourinary Comments: Foley Catheter draining aqeduate amounts of yellow urine.     Musculoskeletal: She exhibits no edema or tenderness.  Right hand contracture. Uses wheelchair with assist  Lymphadenopathy:    She has no cervical adenopathy.  Neurological: She is alert.  Non-verbal   Skin: Skin is warm and dry. No rash noted. No erythema. No pallor.  Bilateral Long protective hand sleeves in  place.    Psychiatric: She has a normal mood and affect.    Labs reviewed:  Recent Labs  11/20/15 11/24/15  NA  --  140  K 4.7  --   BUN  --  21  CREATININE  --  0.8    Recent Labs  11/24/15  ALT 9  ALKPHOS 80    Recent Labs  11/24/15  WBC 9.2  HGB 10.8*  HCT 36   Lab Results  Component Value Date   TSH 5.40 03/18/2015   Assessment/Plan 1. Parkinson's disease, Lewy body Progressive decline expected.continue on sinemet three times daily. Continue to assist with ADL's.   2. Generalized osteoarthritis Continue on Tylenol PRN   3. Gastroesophageal reflux disease without esophagitis Continue on Zantac   4. Slow transit constipation Continue on senokot-S   5. Gait difficulty Uses wheelchair with assistance. Follows simple  commands but unable to self propel.   Family/ staff Communication: Reviewed plan of care with patient and facility Nurse supervisor.   Labs/tests ordered:  None

## 2016-05-16 ENCOUNTER — Non-Acute Institutional Stay (SKILLED_NURSING_FACILITY): Payer: Medicare Other | Admitting: Family

## 2016-05-16 DIAGNOSIS — M159 Polyosteoarthritis, unspecified: Secondary | ICD-10-CM | POA: Diagnosis not present

## 2016-05-16 DIAGNOSIS — R1312 Dysphagia, oropharyngeal phase: Secondary | ICD-10-CM | POA: Diagnosis not present

## 2016-05-16 DIAGNOSIS — K5901 Slow transit constipation: Secondary | ICD-10-CM

## 2016-05-16 DIAGNOSIS — G3183 Dementia with Lewy bodies: Secondary | ICD-10-CM

## 2016-05-16 DIAGNOSIS — K219 Gastro-esophageal reflux disease without esophagitis: Secondary | ICD-10-CM | POA: Diagnosis not present

## 2016-05-16 DIAGNOSIS — R339 Retention of urine, unspecified: Secondary | ICD-10-CM | POA: Diagnosis not present

## 2016-05-16 NOTE — Progress Notes (Signed)
Location:  [place] Room Number: [place] of Service:  SNF (31) Provider:  Arlie Riker FNP-C   Blanchie Serve, MD  Patient Care Team: Blanchie Serve, MD as PCP - General (Internal Medicine) Robert Bellow, MD (General Surgery)  Extended Emergency Contact Information Primary Emergency Contact: Schmidt,Shirley Address: [address] [place], [place] [address] Johnnette Litter of [place] Phone: [phone] Mobile Phone: [phone] Relation: Daughter  Code Status:  DNR  Goals of care: Advanced Directive information Advanced Directives 12/24/2015  Does Patient Have a Medical Advance Directive? Yes  Type of Advance Directive Out of facility DNR (pink MOST or yellow form);Healthcare Power of Attorney  Does patient want to make changes to medical advance directive? No - Patient declined  Copy of [place] in Chart? -  Pre-existing out of facility DNR order (yellow form or pink MOST form) -     Chief Complaint  Patient presents with  . Medical Management of Chronic Issues    HPI:  Pt is a 82 y.o. female seen today at [facility] for medical management of chronic diseases.She has a medical history of Parkinson's disease, Afib, left breast Cancer,GERD, OA, dysphagia among other conditions. She is seen in her room today.She is unable to provide HPI and ROS. Facility Nurse reports no recent weight changes, fall episodes or illness.       Past Medical History:  Diagnosis Date  . Atrial fibrillation ([place])   . Cancer [facility]) 1998   left breast mastectomy chemo and radiation  . Cancer [facility]) 2014   right breast wide excision   . Carotid artery disease ([place])   . Diabetes ([place])   . DVT (deep venous thrombosis) ([place]) 1998  . Hernia   . High cholesterol   . Hypertension   . Hypothyroidism   . Parkinson disease ([place])   . Retinopathy    Past Surgical History:  Procedure Laterality Date  .  APPENDECTOMY  1949  . BREAST SURGERY Left 1998   mastectomy  . BREAST SURGERY Right 2014   wide excision, sentinel node bx, mastoplasty,MammoSite  . CARPAL TUNNEL RELEASE Bilateral 1976  . CHOLECYSTECTOMY  2007  . EYE SURGERY  1985-1986    Allergies  Allergen Reactions  . Penicillins Anaphylaxis  . Shellfish Allergy   . Fish Allergy Hives      Medication List       Accurate as of 05/16/16  7:32 PM. Always use your most recent med list.          acetaminophen 650 MG CR tablet Commonly known as:  TYLENOL Take 650 mg by mouth 3 (three) times daily.   acetaminophen 325 MG tablet Commonly known as:  TYLENOL Take 650 mg by mouth every 6 (six) hours as needed.   carbidopa-levodopa 25-100 MG tablet Commonly known as:  SINEMET IR Take 1 tablet by mouth 3 (three) times daily. For parkinsons   escitalopram 10 MG tablet Commonly known as:  LEXAPRO Take 10 mg by mouth every morning. For depression   ipratropium-albuterol 0.5-2.5 (3) MG/3ML Soln Commonly known as:  DUONEB Take 3 mLs by nebulization every 6 (six) hours as needed. For shortness of breath and wheezing   OXYGEN Inhale 2 L into the lungs.   primidone 50 MG tablet Commonly known as:  MYSOLINE Take 50 mg by mouth 2 (two) times daily. For tremors   Propylene Glycol  0.6 % Soln Apply 1 drop to eye 2 (two) times daily.   QUEtiapine 25 MG tablet Commonly known as:  SEROQUEL Take 25 mg by mouth daily. For psychosis   QUEtiapine 50 MG tablet Commonly known as:  SEROQUEL Take 50 mg by mouth at bedtime. For psychosis   ranitidine 150 MG tablet Commonly known as:  ZANTAC Take 150 mg by mouth daily. For gerd   sennosides-docusate sodium 8.6-50 MG tablet Commonly known as:  SENOKOT-S Take 1 tablet by mouth at bedtime. For constipation   UNABLE TO FIND Med Name: Med Pass : Give 120 ml of honey thick fluid of choice daily with Med pass for additional hydration.       Review of Systems  Unable to perform  ROS: Dementia    Immunization History  Administered Date(s) Administered  . Influenza-Unspecified 03/24/2014, 04/01/2015  . PPD Test 03/20/2014, 03/20/2014, 04/06/2014, 04/06/2014, 02/18/2015, 02/18/2015  . Pneumococcal Conjugate-13 04/23/2015  . Pneumococcal-Unspecified 03/19/2014   Pertinent  Health Maintenance Due  Topic Date Due  . INFLUENZA VACCINE  01/12/2016  . DEXA SCAN  06/14/2023 (Originally 10/18/1996)  . PNA vac Low Risk Adult  Completed      Vitals:   05/16/16 1045  BP: 130/68  Pulse: 76  Resp: 18  Temp: 98.4 F (36.9 C)  SpO2: 95%  Weight: 131 lb 8 oz (59.6 kg)  Height: 5\' 3"  (1.6 m)   Body mass index is 23.29 kg/m. Physical Exam  Constitutional: She appears well-developed and well-nourished. No distress.  Nonverbal   HENT:  Head: Normocephalic.  Mouth/Throat: Oropharynx is clear and moist. No oropharyngeal exudate.  Eyes: Conjunctivae and EOM are normal. Pupils are equal, round, and reactive to light. Right eye exhibits no discharge. Left eye exhibits no discharge. No scleral icterus.  Neck: Normal range of motion. No JVD present.  Cardiovascular: Normal rate, regular rhythm, normal heart sounds and intact distal pulses.  Exam reveals no gallop and no friction rub.   No murmur heard. Pulmonary/Chest: Effort normal and breath sounds normal. No respiratory distress. She has no wheezes. She has no rales.  Abdominal: Soft. Bowel sounds are normal. She exhibits no distension. There is no tenderness. There is no rebound and no guarding.  Genitourinary:  Genitourinary Comments: Foley Catheter draining aqeduate amounts of yellow urine.     Musculoskeletal: She exhibits no edema or tenderness.  Right hand contracture.Wheelchair bound   Lymphadenopathy:    She has no cervical adenopathy.  Neurological: She is alert.  Non-verbal but follows simple commands.   Skin: Skin is warm and dry. No rash noted. No erythema. No pallor.  Bilateral Long protective hand  sleeves in place.    Psychiatric: She has a normal mood and affect.    Labs reviewed:  Recent Labs  11/20/15 11/24/15  NA  --  140  K 4.7  --   BUN  --  21  CREATININE  --  0.8    Recent Labs  11/24/15  ALT 9  ALKPHOS 80    Recent Labs  11/24/15  WBC 9.2  HGB 10.8*  HCT 36   Lab Results  Component Value Date   TSH 5.40 03/18/2015   Assessment/Plan 1. Parkinson's disease, Lewy body Progressive decline expected. Continue on sinemet IR 25-100 mg Tablet three times daily. Continue to assist with ADL's. Aspiration precautions. Skin Care.   2. Generalized osteoarthritis Continue current pain regimen.   3. Oropharyngeal dysphagia Continue to assist with all meals. Aspiration precautions.  4.  Urinary retention Indwelling foley catheter draining adequate amounts of yellow clear urine. Continue foley care per facility protocol.   5. Gastroesophageal reflux disease without esophagitis Continue on ranitidine daily.   6. Slow transit constipation Current regimen effective. Continue to monitor.     Family/ staff Communication: Reviewed plan of care with facility Nurse supervisor.   Labs/tests ordered: None

## 2016-05-24 ENCOUNTER — Encounter: Payer: Self-pay | Admitting: Family

## 2016-05-24 ENCOUNTER — Non-Acute Institutional Stay (SKILLED_NURSING_FACILITY): Payer: Medicare Other | Admitting: Family

## 2016-05-24 DIAGNOSIS — M25569 Pain in unspecified knee: Secondary | ICD-10-CM

## 2016-05-24 NOTE — Progress Notes (Signed)
Patient ID: Kristin Frey, female   DOB: Oct 27, 1929, 82 y.o.   MRN: [id number]  Location:  [place] Room Number: [id number] of Service:  SNF (31) Provider: Rachid Parham FNP-C   Blanchie Serve, MD  Patient Care Team: Blanchie Serve, MD as PCP - General (Internal Medicine) Robert Bellow, MD (General Surgery)  Extended Emergency Contact Information Primary Emergency Contact: Schmidt,Shirley Address: [address] [place], [place] [address] Johnnette Litter of [place] Phone: [phone] Mobile Phone: [phone] Relation: Daughter  Code Status: DNR  Goals of care: Advanced Directive information Advanced Directives 05/24/2016  Does Patient Have a Medical Advance Directive? Yes  Type of Advance Directive Out of facility DNR (pink MOST or yellow form);Healthcare Power of Attorney  Does patient want to make changes to medical advance directive? -  Copy of [place] in Chart? Yes  Pre-existing out of facility DNR order (yellow form or pink MOST form) Pink MOST form placed in chart (order not valid for inpatient use);Yellow form placed in chart (order not valid for inpatient use)     Chief Complaint  Patient presents with  . Acute Visit    pain in feet    HPI:  Pt is a 82 y.o. female seen today for an acute visit for evaluation of pain on the feet. She has a medical history of Parkinson Disease, Depression, OA among other conditions. She is seen in her room lying in bed this visit. Hospice Nurse reports patient showing signs of pain when CNA washing her feet. Patient nodded yes when asked if she had pain on the feet. She nods head and grimaces during visit but nonverbal.She is currently on scheduled tylenol but does not seem to relief pain.     Past Medical History:  Diagnosis Date  . Atrial fibrillation ([place])   . Cancer [facility]) 1998   left breast mastectomy chemo and radiation  . Cancer [facility]) 2014   right  breast wide excision   . Carotid artery disease ([place])   . Diabetes ([place])   . DVT (deep venous thrombosis) ([place]) 1998  . Hernia   . High cholesterol   . Hypertension   . Hypothyroidism   . Parkinson disease ([place])   . Retinopathy    Past Surgical History:  Procedure Laterality Date  . APPENDECTOMY  1949  . BREAST SURGERY Left 1998   mastectomy  . BREAST SURGERY Right 2014   wide excision, sentinel node bx, mastoplasty,MammoSite  . CARPAL TUNNEL RELEASE Bilateral 1976  . CHOLECYSTECTOMY  2007  . EYE SURGERY  1985-1986    Allergies  Allergen Reactions  . Penicillins Anaphylaxis  . Shellfish Allergy   . Fish Allergy Hives      Medication List       Accurate as of 05/24/16  3:04 PM. Always use your most recent med list.          acetaminophen 650 MG CR tablet Commonly known as:  TYLENOL Take 650 mg by mouth 3 (three) times daily.   acetaminophen 325 MG tablet Commonly known as:  TYLENOL Take 650 mg by mouth every 6 (six) hours as needed.   carbidopa-levodopa 25-100 MG tablet Commonly known as:  SINEMET IR Take 1 tablet by mouth 3 (three) times daily. For parkinsons   escitalopram 10 MG tablet Commonly known as:  LEXAPRO Take 10 mg by mouth every morning. For depression   ipratropium-albuterol  0.5-2.5 (3) MG/3ML Soln Commonly known as:  DUONEB Take 3 mLs by nebulization every 6 (six) hours as needed. For shortness of breath and wheezing   OXYGEN Inhale 2 L into the lungs.   primidone 50 MG tablet Commonly known as:  MYSOLINE Take 50 mg by mouth 2 (two) times daily. For tremors   Propylene Glycol 0.6 % Soln Apply 1 drop to eye 2 (two) times daily.   QUEtiapine 25 MG tablet Commonly known as:  SEROQUEL Take 25 mg by mouth daily. For psychosis   QUEtiapine 50 MG tablet Commonly known as:  SEROQUEL Take 50 mg by mouth at bedtime. For psychosis   ranitidine 150 MG tablet Commonly known as:  ZANTAC Take 150 mg by mouth daily. For gerd     sennosides-docusate sodium 8.6-50 MG tablet Commonly known as:  SENOKOT-S Take 1 tablet by mouth at bedtime. For constipation   UNABLE TO FIND Med Name: Med Pass : Give 120 ml of honey thick fluid of choice daily with Med pass for additional hydration.       Review of Systems  Unable to perform ROS: Dementia    Immunization History  Administered Date(s) Administered  . Influenza-Unspecified 03/24/2014, 04/01/2015, 03/29/2016  . PPD Test 03/20/2014, 03/20/2014, 04/06/2014, 04/06/2014, 02/18/2015, 02/18/2015  . Pneumococcal Conjugate-13 04/23/2015  . Pneumococcal-Unspecified 03/19/2014   Pertinent  Health Maintenance Due  Topic Date Due  . INFLUENZA VACCINE  01/12/2016  . DEXA SCAN  06/14/2023 (Originally 10/18/1996)  . PNA vac Low Risk Adult  Completed      Vitals:   05/24/16 1450  BP: 116/60  Resp: 16  Temp: (!) 96.9 F (36.1 C)  TempSrc: Oral  SpO2: 92%  Weight: 130 lb (59 kg)   Body mass index is 23.03 kg/m. Physical Exam  Constitutional: She appears well-developed and well-nourished. No distress.  Nonverbal but follows commands. Grimaces when feet are touched. Nods head when asked if in pain.   HENT:  Head: Normocephalic.  Mouth/Throat: Oropharynx is clear and moist. No oropharyngeal exudate.  Eyes: Conjunctivae and EOM are normal. Pupils are equal, round, and reactive to light. Right eye exhibits no discharge. Left eye exhibits no discharge. No scleral icterus.  Neck: Normal range of motion. No JVD present.  Cardiovascular: Normal rate, regular rhythm, normal heart sounds and intact distal pulses.  Exam reveals no gallop and no friction rub.   No murmur heard. Pulmonary/Chest: Effort normal and breath sounds normal. No respiratory distress. She has no wheezes. She has no rales.  Abdominal: Soft. Bowel sounds are normal. She exhibits no distension. There is no tenderness. There is no rebound and no guarding.  Genitourinary:  Genitourinary Comments: Foley  Catheter draining aqeduate amounts of yellow urine.     Musculoskeletal: She exhibits no edema or tenderness.  Right hand contracture. Uses wheelchair with assist  Lymphadenopathy:    She has no cervical adenopathy.  Neurological: She is alert.  Non-verbal but follows commands.  Skin: Skin is warm and dry. No rash noted. No erythema. No pallor.  Bilateral Long protective hand sleeves in place.    Psychiatric: She has a normal mood and affect.    Labs reviewed:  Recent Labs  11/20/15 11/24/15  NA  --  140  K 4.7  --   BUN  --  21  CREATININE  --  0.8    Recent Labs  11/24/15  ALT 9  ALKPHOS 80    Recent Labs  11/24/15  WBC 9.2  HGB  10.8*  HCT 36   Lab Results  Component Value Date   TSH 5.40 03/18/2015    Assessment/Plan Pain on Feet Nonverbal but grimaces when feet are touched. Also nods head for "yes" for pain. Will discontinue current Tylenol then start Tramadol 50 mg Tablet 1/2 Tablet twice for pain. Hold for sedation. Continue to follow up with Hospice service.    Family/ staff Communication: Reviewed plan of care with patient and facility Nurse supervisor.   Labs/tests ordered: None

## 2016-06-16 ENCOUNTER — Encounter: Payer: Self-pay | Admitting: Family

## 2016-06-16 ENCOUNTER — Non-Acute Institutional Stay (SKILLED_NURSING_FACILITY): Payer: Medicare Other | Admitting: Family

## 2016-06-16 DIAGNOSIS — L89152 Pressure ulcer of sacral region, stage 2: Secondary | ICD-10-CM

## 2016-06-16 DIAGNOSIS — F329 Major depressive disorder, single episode, unspecified: Secondary | ICD-10-CM

## 2016-06-16 DIAGNOSIS — G20 Parkinson's disease: Secondary | ICD-10-CM | POA: Diagnosis not present

## 2016-06-16 DIAGNOSIS — F0393 Unspecified dementia, unspecified severity, with mood disturbance: Secondary | ICD-10-CM

## 2016-06-16 DIAGNOSIS — F0280 Dementia in other diseases classified elsewhere without behavioral disturbance: Secondary | ICD-10-CM

## 2016-06-16 NOTE — Progress Notes (Signed)
Location:  [facility] Room Number: [id number] of Service:  SNF (31) Provider:  Marlowe Sax, FNP-C   Blanchie Serve, MD  Patient Care Team: Blanchie Serve, MD as PCP - General (Internal Medicine) Robert Bellow, MD (General Surgery)  Extended Emergency Contact Information Primary Emergency Contact: Schmidt,Shirley Address: [place] [place], [place] [address] Johnnette Litter of [place] Phone: [phone] Mobile Phone: [phone] Relation: Daughter  Code Status:  DNR Goals of care: Advanced Directive information Advanced Directives 06/16/2016  Does Patient Have a Medical Advance Directive? Yes  Type of Paramedic of [place];Out of facility DNR (pink MOST or yellow form)  Does patient want to make changes to medical advance directive? -  Copy of [place] in Chart? Yes  Pre-existing out of facility DNR order (yellow form or pink MOST form) Yellow form placed in chart (order not valid for inpatient use);Pink MOST form placed in chart (order not valid for inpatient use)     Chief Complaint  Patient presents with  . Medical Management of Chronic Issues    routine visit    HPI:  Pt is a 83 y.o. female seen today at [facility] for medical management of chronic diseases. She has a medical history of Parkinson disease, OA, breast cancer among other conditions.She is seen in her room today. She is unable to provider HPI and ROS. Facility Nurse reports no new concerns.    Past Medical History:  Diagnosis Date  . Atrial fibrillation ([place])   . Cancer [facility]) 1998   left breast mastectomy chemo and radiation  . Cancer [facility]) 2014   right breast wide excision   . Carotid artery disease ([place])   . Diabetes ([place])   . DVT (deep venous thrombosis) ([place]) 1998  . Hernia   . High cholesterol   . Hypertension   . Hypothyroidism   . Parkinson disease ([place])   . Retinopathy     Past Surgical History:  Procedure Laterality Date  . APPENDECTOMY  1949  . BREAST SURGERY Left 1998   mastectomy  . BREAST SURGERY Right 2014   wide excision, sentinel node bx, mastoplasty,MammoSite  . CARPAL TUNNEL RELEASE Bilateral 1976  . CHOLECYSTECTOMY  2007  . EYE SURGERY  1985-1986    Allergies  Allergen Reactions  . Penicillins Anaphylaxis  . Shellfish Allergy   . Fish Allergy Hives    Allergies as of 06/16/2016      Reactions   Penicillins Anaphylaxis   Shellfish Allergy    Fish Allergy Hives      Medication List       Accurate as of 06/16/16 10:48 AM. Always use your most recent med list.          acetaminophen 650 MG CR tablet Commonly known as:  TYLENOL Take 650 mg by mouth 3 (three) times daily.   acetaminophen 325 MG tablet Commonly known as:  TYLENOL Take 650 mg by mouth every 6 (six) hours as needed.   carbidopa-levodopa 25-100 MG tablet Commonly known as:  SINEMET IR Take 1 tablet by mouth 3 (three) times daily. For parkinsons   escitalopram 10 MG tablet Commonly known as:  LEXAPRO Take 10 mg by mouth every morning. For depression   ipratropium-albuterol 0.5-2.5 (3) MG/3ML Soln Commonly known as:  DUONEB Take 3 mLs by nebulization every 6 (six) hours as needed. For shortness of breath  and wheezing   multivitamin tablet Take 1 tablet by mouth daily.   OXYGEN Inhale 2 L into the lungs.   primidone 50 MG tablet Commonly known as:  MYSOLINE Take 50 mg by mouth 2 (two) times daily. For tremors   Propylene Glycol 0.6 % Soln Apply 1 drop to eye 2 (two) times daily.   PROSTAT PO Take by mouth. Take daily for 30 days   QUEtiapine 25 MG tablet Commonly known as:  SEROQUEL Take 25 mg by mouth daily. For psychosis   QUEtiapine 50 MG tablet Commonly known as:  SEROQUEL Take 50 mg by mouth at bedtime. For psychosis   ranitidine 150 MG tablet Commonly known as:  ZANTAC Take 150 mg by mouth daily. For gerd   sennosides-docusate sodium  8.6-50 MG tablet Commonly known as:  SENOKOT-S Take 1 tablet by mouth at bedtime. For constipation   traMADol 50 MG tablet Commonly known as:  ULTRAM Take 50 mg by mouth. Take 1/2 tablet 25 mg every 12 hours for feet/leg pain   UNABLE TO FIND Med Name: Med Pass : Give 120 ml of honey thick fluid of choice daily with Med pass for additional hydration.       Review of Systems  Unable to perform ROS: Dementia    Immunization History  Administered Date(s) Administered  . Influenza-Unspecified 03/24/2014, 04/01/2015, 03/29/2016  . PPD Test 03/20/2014, 03/20/2014, 04/06/2014, 04/06/2014, 02/18/2015, 02/18/2015  . Pneumococcal Conjugate-13 04/23/2015  . Pneumococcal-Unspecified 03/19/2014   Pertinent  Health Maintenance Due  Topic Date Due  . DEXA SCAN  06/14/2023 (Originally 10/18/1996)  . INFLUENZA VACCINE  Completed  . PNA vac Low Risk Adult  Completed    Vitals:   06/16/16 1030  BP: 121/60  Pulse: 62  Resp: 20  Temp: 98.8 F (37.1 C)  SpO2: 96%  Weight: 130 lb (59 kg)  Height: 5\' 3"  (1.6 m)   Body mass index is 23.03 kg/m. Physical Exam  Constitutional: She appears well-developed and well-nourished. No distress.  Nonverbal but follows commands. Grimaces when feet are touched. Nods head when asked if in pain.   HENT:  Head: Normocephalic.  Mouth/Throat: Oropharynx is clear and moist. No oropharyngeal exudate.  Eyes: Conjunctivae and EOM are normal. Pupils are equal, round, and reactive to light. Right eye exhibits no discharge. Left eye exhibits no discharge. No scleral icterus.  Neck: Normal range of motion. No JVD present.  Cardiovascular: Normal rate, regular rhythm, normal heart sounds and intact distal pulses.  Exam reveals no gallop and no friction rub.   No murmur heard. Pulmonary/Chest: Effort normal and breath sounds normal. No respiratory distress. She has no wheezes. She has no rales.  Abdominal: Soft. Bowel sounds are normal. She exhibits no distension.  There is no tenderness. There is no rebound and no guarding.  Genitourinary:  Genitourinary Comments: Foley Catheter draining aqeduate amounts of yellow urine.     Musculoskeletal: She exhibits no edema or tenderness.  Right hand contracture. Uses wheelchair with assist  Lymphadenopathy:    She has no cervical adenopathy.  Neurological: She is alert.  Non-verbal but follows commands.  Skin: Skin is warm and dry. No rash noted. No erythema. No pallor.  Stage 2 pressure ulcer to sacral area wound bed pink without any drainage noted. Surrounding tissues without any signs of infections.    Psychiatric: She has a normal mood and affect.    Labs reviewed:  Recent Labs  11/20/15 11/24/15  NA  --  140  K  4.7  --   BUN  --  21  CREATININE  --  0.8    Recent Labs  11/24/15  ALT 9  ALKPHOS 80    Recent Labs  11/24/15  WBC 9.2  HGB 10.8*  HCT 36   Lab Results  Component Value Date   TSH 5.40 03/18/2015   Assessment/Plan Parkinson's disease  Progressive decline expected. Continue on sinemet IR.Aspiration precautions. Continue to assist with ADL's.   Stage 2 sacral ulcer  Afebrile. Wound bed pink without any signs of infections. Continue with wound care.   Depression  Stable. Continue on Lexapro. Monitor for mood changes.    Family/ staff Communication: Reviewed plan of care with patient and facility Nurse supervisor.   Labs/tests ordered:  None

## 2016-07-14 ENCOUNTER — Non-Acute Institutional Stay (SKILLED_NURSING_FACILITY): Payer: Medicare Other | Admitting: Family

## 2016-07-14 ENCOUNTER — Encounter: Payer: Self-pay | Admitting: Family

## 2016-07-14 DIAGNOSIS — F329 Major depressive disorder, single episode, unspecified: Secondary | ICD-10-CM | POA: Diagnosis not present

## 2016-07-14 DIAGNOSIS — R638 Other symptoms and signs concerning food and fluid intake: Secondary | ICD-10-CM

## 2016-07-14 DIAGNOSIS — K219 Gastro-esophageal reflux disease without esophagitis: Secondary | ICD-10-CM | POA: Diagnosis not present

## 2016-07-14 DIAGNOSIS — F0280 Dementia in other diseases classified elsewhere without behavioral disturbance: Secondary | ICD-10-CM

## 2016-07-14 DIAGNOSIS — G20A1 Parkinson's disease without dyskinesia, without mention of fluctuations: Secondary | ICD-10-CM

## 2016-07-14 DIAGNOSIS — G20 Parkinson's disease: Secondary | ICD-10-CM | POA: Diagnosis not present

## 2016-07-14 DIAGNOSIS — F0393 Unspecified dementia, unspecified severity, with mood disturbance: Secondary | ICD-10-CM

## 2016-07-14 NOTE — Progress Notes (Signed)
Location:  [place] Room Number: 207-P Place of Service:  SNF (31) Provider:  Marlowe Sax, FNP-C   Blanchie Serve, MD  Patient Care Team: Blanchie Serve, MD as PCP - General (Internal Medicine) Robert Bellow, MD (General Surgery)  Extended Emergency Contact Information Primary Emergency Contact: Schmidt,Shirley Address: [address] [place], [place] [address] Johnnette Litter of [place] Phone: [phone] Mobile Phone: [phone] Relation: Daughter  Code Status:  DNR Goals of care: Advanced Directive information Advanced Directives 06/16/2016  Does Patient Have a Medical Advance Directive? Yes  Type of Paramedic of [place];Out of facility DNR (pink MOST or yellow form)  Does patient want to make changes to medical advance directive? -  Copy of [place] in Chart? Yes  Pre-existing out of facility DNR order (yellow form or pink MOST form) Yellow form placed in chart (order not valid for inpatient use);Pink MOST form placed in chart (order not valid for inpatient use)     Chief Complaint  Patient presents with  . Medical Management of Chronic Issues    Routine Visit     HPI:  Pt is a 83 y.o. female seen today at [facility] for medical management of chronic diseases. She has a medical history of Parkinson disease, OA, breast cancer among other conditions.She is seen in her room today. She is unable to provider HPI and ROS. Nurse reports patient has had decreased oral intake.Her previous skin breakdown resolved. She has preventive dressing changed by wound care Nurse. She continues to follow up with hospice service.    Past Medical History:  Diagnosis Date  . Atrial fibrillation ([place])   . Cancer [facility]) 1998   left breast mastectomy chemo and radiation  . Cancer [facility]) 2014   right breast wide excision   . Carotid artery disease ([place])   . Diabetes ([place])     . DVT (deep venous thrombosis) ([place]) 1998  . Hernia   . High cholesterol   . Hypertension   . Hypothyroidism   . Parkinson disease ([place])   . Retinopathy    Past Surgical History:  Procedure Laterality Date  . APPENDECTOMY  1949  . BREAST SURGERY Left 1998   mastectomy  . BREAST SURGERY Right 2014   wide excision, sentinel node bx, mastoplasty,MammoSite  . CARPAL TUNNEL RELEASE Bilateral 1976  . CHOLECYSTECTOMY  2007  . EYE SURGERY  1985-1986    Allergies  Allergen Reactions  . Penicillins Anaphylaxis  . Shellfish Allergy   . Fish Allergy Hives    Allergies as of 07/14/2016      Reactions   Penicillins Anaphylaxis   Shellfish Allergy    Fish Allergy Hives      Medication List       Accurate as of 07/14/16  1:10 PM. Always use your most recent med list.          acetaminophen 325 MG tablet Commonly known as:  TYLENOL Take 650 mg by mouth every 6 (six) hours as needed.   carbidopa-levodopa 25-100 MG tablet Commonly known as:  SINEMET IR Take 1 tablet by mouth 3 (three) times daily. For parkinsons   escitalopram 10 MG tablet Commonly known as:  LEXAPRO Take 10 mg by mouth every morning. For depression   ipratropium-albuterol 0.5-2.5 (3) MG/3ML Soln Commonly known as:  DUONEB Take 3 mLs by nebulization every 6 (six) hours as  needed. For shortness of breath and wheezing   OXYGEN Inhale 2 L into the lungs.   primidone 50 MG tablet Commonly known as:  MYSOLINE Take 50 mg by mouth 2 (two) times daily. For tremors   Propylene Glycol 0.6 % Soln Apply 1 drop to eye 2 (two) times daily.   QUEtiapine 25 MG tablet Commonly known as:  SEROQUEL Take 25 mg by mouth daily. For psychosis   QUEtiapine 50 MG tablet Commonly known as:  SEROQUEL Take 50 mg by mouth at bedtime. For psychosis   ranitidine 150 MG tablet Commonly known as:  ZANTAC Take 150 mg by mouth daily. For gerd   sennosides-docusate sodium 8.6-50 MG tablet Commonly known as:  SENOKOT-S Take 1  tablet by mouth at bedtime. For constipation   traMADol 50 MG tablet Commonly known as:  ULTRAM Take 25 mg by mouth every 12 (twelve) hours.   UNABLE TO FIND Med Name: Med Pass : Give 120 ml of honey thick fluid of choice daily with Med pass for additional hydration.       Review of Systems  Unable to perform ROS: Dementia    Immunization History  Administered Date(s) Administered  . Influenza-Unspecified 03/24/2014, 04/01/2015, 03/29/2016  . PPD Test 03/20/2014, 03/20/2014, 04/06/2014, 04/06/2014, 02/18/2015, 02/18/2015  . Pneumococcal Conjugate-13 04/23/2015  . Pneumococcal-Unspecified 03/19/2014   Pertinent  Health Maintenance Due  Topic Date Due  . DEXA SCAN  06/14/2023 (Originally 10/18/1996)  . INFLUENZA VACCINE  Completed  . PNA vac Low Risk Adult  Completed    Vitals:   07/14/16 1304  BP: (!) 141/72  Pulse: 62  Resp: 19  Temp: 98.3 F (36.8 C)  TempSrc: Oral  SpO2: 96%  Weight: 130 lb (59 kg)  Height: 5\' 3"  (1.6 m)   Body mass index is 23.03 kg/m. Physical Exam  Constitutional: She appears well-developed and well-nourished. No distress.  Nonverbal   HENT:  Head: Normocephalic.  Mouth/Throat: Oropharynx is clear and moist. No oropharyngeal exudate.  Eyes: Conjunctivae and EOM are normal. Pupils are equal, round, and reactive to light. Right eye exhibits no discharge. Left eye exhibits no discharge. No scleral icterus.  Neck: Normal range of motion. No JVD present.  Cardiovascular: Normal rate, regular rhythm, normal heart sounds and intact distal pulses.  Exam reveals no gallop and no friction rub.   No murmur heard. Pulmonary/Chest: Effort normal and breath sounds normal. No respiratory distress. She has no wheezes. She has no rales.  Abdominal: Soft. Bowel sounds are normal. She exhibits no distension. There is no tenderness. There is no rebound and no guarding.  Genitourinary:  Genitourinary Comments: Foley Catheter draining dark amber urine.      Musculoskeletal: She exhibits no edema or tenderness.  Bilateral lower extremities weakness   Lymphadenopathy:    She has no cervical adenopathy.  Neurological: She is alert.  Non-verbal but follows commands to turn in bed.   Skin: Skin is warm and dry. No rash noted. No erythema. No pallor.  Skin intact   Psychiatric: She has a normal mood and affect.    Labs reviewed:  Recent Labs  11/20/15 11/24/15  NA  --  140  K 4.7  --   BUN  --  21  CREATININE  --  0.8    Recent Labs  11/24/15  ALT 9  ALKPHOS 80    Recent Labs  11/24/15  WBC 9.2  HGB 10.8*  HCT 36   Lab Results  Component Value Date  TSH 5.40 03/18/2015   Assessment/Plan Parkinson's disease  Progressive decline expected.Decreased oral intake reported.Continue on sinemet IR.Continue primidone to help with tremors.Aspiration precautions. Continue to assist with ADL's. Continue to follow up with hospice service.   Decreased oral intake  Nurse reports decreased fluid intake. Continue to monitor. Continue to follow up with hospice service.   GERD  Continue on Zantac   Depression  Stable. Continue on Lexapro. Monitor for mood changes.    Family/ staff Communication: Reviewed plan of care with patient and facility Nurse supervisor.   Labs/tests ordered:  None

## 2016-08-11 ENCOUNTER — Non-Acute Institutional Stay (SKILLED_NURSING_FACILITY): Payer: Medicare Other | Admitting: Family

## 2016-08-11 ENCOUNTER — Encounter: Payer: Self-pay | Admitting: Family

## 2016-08-11 DIAGNOSIS — K219 Gastro-esophageal reflux disease without esophagitis: Secondary | ICD-10-CM | POA: Diagnosis not present

## 2016-08-11 DIAGNOSIS — F0393 Unspecified dementia, unspecified severity, with mood disturbance: Secondary | ICD-10-CM

## 2016-08-11 DIAGNOSIS — F329 Major depressive disorder, single episode, unspecified: Secondary | ICD-10-CM | POA: Diagnosis not present

## 2016-08-11 DIAGNOSIS — K5901 Slow transit constipation: Secondary | ICD-10-CM | POA: Diagnosis not present

## 2016-08-11 DIAGNOSIS — F0280 Dementia in other diseases classified elsewhere without behavioral disturbance: Secondary | ICD-10-CM | POA: Diagnosis not present

## 2016-08-11 DIAGNOSIS — G20 Parkinson's disease: Secondary | ICD-10-CM | POA: Diagnosis not present

## 2016-08-11 NOTE — Progress Notes (Signed)
Location:  [place] Room Number: [id number] of Service:  SNF (31) Provider:  Marlowe Sax, FNP-C   Blanchie Serve, MD  Patient Care Team: Blanchie Serve, MD as PCP - General (Internal Medicine) Robert Bellow, MD (General Surgery)  Extended Emergency Contact Information Primary Emergency Contact: Schmidt,Shirley Address: [place], [place] [address] Johnnette Litter of [place] Phone: [phone] Mobile Phone: [phone] Relation: Daughter  Code Status:  DNR Goals of care: Advanced Directive information Advanced Directives 08/11/2016  Does Patient Have a Medical Advance Directive? Yes  Type of Advance Directive Out of facility DNR (pink MOST or yellow form);Healthcare Power of Attorney  Does patient want to make changes to medical advance directive? No - Patient declined  Copy of [place] in Chart? -  Pre-existing out of facility DNR order (yellow form or pink MOST form) -     Chief Complaint  Patient presents with  . Medical Management of Chronic Issues    Routine Visit    HPI:  Pt is a 83 y.o. female seen today at [facility] for medical management of chronic diseases. She has a medical history of Parkinson disease, OA, breast cancer among other conditions.She is seen in her room today.No recent acute illness reported.No weight changes since prior visit.She is unable to provider HPI and ROS.she continues to have  decreased oral and fluid  intake.No skin break down.She continues to follow up with hospice service.    Past Medical History:  Diagnosis Date  . Atrial fibrillation ([place])   . Cancer [facility]) 1998   left breast mastectomy chemo and radiation  . Cancer [facility]) 2014   right breast wide excision   . Carotid artery disease ([place])   . Diabetes ([place])   . DVT (deep venous thrombosis) ([place]) 1998  . Hernia   . High cholesterol   . Hypertension   . Hypothyroidism   .  Parkinson disease ([place])   . Retinopathy    Past Surgical History:  Procedure Laterality Date  . APPENDECTOMY  1949  . BREAST SURGERY Left 1998   mastectomy  . BREAST SURGERY Right 2014   wide excision, sentinel node bx, mastoplasty,MammoSite  . CARPAL TUNNEL RELEASE Bilateral 1976  . CHOLECYSTECTOMY  2007  . EYE SURGERY  1985-1986    Allergies  Allergen Reactions  . Penicillins Anaphylaxis  . Shellfish Allergy   . Fish Allergy Hives    Allergies as of 08/11/2016      Reactions   Penicillins Anaphylaxis   Shellfish Allergy    Fish Allergy Hives      Medication List       Accurate as of 08/11/16  4:21 PM. Always use your most recent med list.          acetaminophen 325 MG tablet Commonly known as:  TYLENOL Take 650 mg by mouth every 6 (six) hours as needed for fever. Temp > 99.5   carbidopa-levodopa 25-100 MG tablet Commonly known as:  SINEMET IR Take 1 tablet by mouth 3 (three) times daily. For parkinsons   escitalopram 10 MG tablet Commonly known as:  LEXAPRO Take 10 mg by mouth every morning. For depression   ipratropium-albuterol 0.5-2.5 (3) MG/3ML Soln Commonly known as:  DUONEB Take 3 mLs by nebulization every 6 (six) hours as needed. For shortness of breath and wheezing   OXYGEN Inhale 2 L into the lungs  as needed. To maintain oxygen saturation greater than 90 % and shortness of breath   primidone 50 MG tablet Commonly known as:  MYSOLINE Take 50 mg by mouth 2 (two) times daily. For tremors   Propylene Glycol 0.6 % Soln Apply 1 drop to eye 2 (two) times daily.   QUEtiapine 25 MG tablet Commonly known as:  SEROQUEL Take 25 mg by mouth daily. For psychosis   QUEtiapine 50 MG tablet Commonly known as:  SEROQUEL Take 50 mg by mouth at bedtime. For psychosis   ranitidine 150 MG tablet Commonly known as:  ZANTAC Take 150 mg by mouth daily. For gerd   sennosides-docusate sodium 8.6-50 MG tablet Commonly known as:  SENOKOT-S Take 1 tablet by mouth  at bedtime. For constipation   traMADol 50 MG tablet Commonly known as:  ULTRAM Take 25 mg by mouth every 12 (twelve) hours.   UNABLE TO FIND Med Name: Med Pass : Give 120 ml of honey thick fluid of choice daily with Med pass for additional hydration.       Review of Systems  Unable to perform ROS: Dementia    Immunization History  Administered Date(s) Administered  . Influenza-Unspecified 03/24/2014, 04/01/2015, 03/29/2016  . PPD Test 03/20/2014, 03/20/2014, 04/06/2014, 04/06/2014, 02/18/2015, 02/18/2015  . Pneumococcal Conjugate-13 04/23/2015  . Pneumococcal-Unspecified 03/19/2014   Pertinent  Health Maintenance Due  Topic Date Due  . DEXA SCAN  06/14/2023 (Originally 10/18/1996)  . INFLUENZA VACCINE  Completed  . PNA vac Low Risk Adult  Completed    Vitals:   08/11/16 1613  BP: 113/69  Pulse: 61  Resp: 20  Temp: 97 F (36.1 C)  Weight: 126 lb (57.2 kg)  Height: 5\' 3"  (1.6 m)   Body mass index is 22.32 kg/m. Physical Exam  Constitutional: She appears well-developed and well-nourished. No distress.  Nonverbal though follows simple commands to turn in the bed.   HENT:  Head: Normocephalic.  Mouth/Throat: Oropharynx is clear and moist. No oropharyngeal exudate.  Eyes: Conjunctivae and EOM are normal. Pupils are equal, round, and reactive to light. Right eye exhibits no discharge. Left eye exhibits no discharge. No scleral icterus.  Neck: Normal range of motion. No JVD present.  Cardiovascular: Normal rate, regular rhythm, normal heart sounds and intact distal pulses.  Exam reveals no gallop and no friction rub.   No murmur heard. Pulmonary/Chest: Effort normal and breath sounds normal. No respiratory distress. She has no wheezes. She has no rales.  Abdominal: Soft. Bowel sounds are normal. She exhibits no distension. There is no tenderness. There is no rebound and no guarding.  Genitourinary:  Genitourinary Comments: Foley Catheter draining yellow urine.     Musculoskeletal: She exhibits no edema or tenderness.  Bilateral lower extremities weakness.   Lymphadenopathy:    She has no cervical adenopathy.  Neurological: She is alert.  Non-verbal but follows commands to turn in bed.   Skin: Skin is warm and dry. No rash noted. No erythema. No pallor.  Skin intact   Psychiatric: She has a normal mood and affect.    Labs reviewed:  Recent Labs  11/20/15 11/24/15  NA  --  140  K 4.7  --   BUN  --  21  CREATININE  --  0.8    Recent Labs  11/24/15  ALT 9  ALKPHOS 80    Recent Labs  11/24/15  WBC 9.2  HGB 10.8*  HCT 36   Lab Results  Component Value Date   TSH  5.40 03/18/2015   Assessment/Plan  constipation Current regimen effective. Encourage fluid intake. Continue to monitor.   Parkinson's disease  Progressive decline expected.Continue on sinemet IR.Continue primidone for tremors Continue to assist with ADL's. Continue to follow up with hospice service.   GERD  Continue on Zantac   Depression  Stable. Continue on Lexapro. Monitor for mood changes.    Family/ staff Communication: Reviewed plan of care with patient and facility Nurse supervisor.   Labs/tests ordered:  None

## 2016-09-19 ENCOUNTER — Encounter: Payer: Self-pay | Admitting: Internal Medicine

## 2016-09-19 ENCOUNTER — Non-Acute Institutional Stay (SKILLED_NURSING_FACILITY): Payer: Medicare Other | Admitting: Internal Medicine

## 2016-09-19 DIAGNOSIS — F02818 Dementia in other diseases classified elsewhere, unspecified severity, with other behavioral disturbance: Secondary | ICD-10-CM

## 2016-09-19 DIAGNOSIS — N319 Neuromuscular dysfunction of bladder, unspecified: Secondary | ICD-10-CM

## 2016-09-19 DIAGNOSIS — D638 Anemia in other chronic diseases classified elsewhere: Secondary | ICD-10-CM | POA: Diagnosis not present

## 2016-09-19 DIAGNOSIS — Z872 Personal history of diseases of the skin and subcutaneous tissue: Secondary | ICD-10-CM | POA: Diagnosis not present

## 2016-09-19 DIAGNOSIS — K219 Gastro-esophageal reflux disease without esophagitis: Secondary | ICD-10-CM

## 2016-09-19 DIAGNOSIS — G20 Parkinson's disease: Secondary | ICD-10-CM | POA: Diagnosis not present

## 2016-09-19 DIAGNOSIS — G8929 Other chronic pain: Secondary | ICD-10-CM

## 2016-09-19 DIAGNOSIS — R1319 Other dysphagia: Secondary | ICD-10-CM

## 2016-09-19 DIAGNOSIS — F0281 Dementia in other diseases classified elsewhere with behavioral disturbance: Secondary | ICD-10-CM

## 2016-09-19 NOTE — Progress Notes (Signed)
Patient ID: Kristin Frey, female   DOB: [birth year], 83 y.o.   MRN: [id number]     Facility: [facility]   Code status: DNR  Chief Complaint  Patient presents with  . Medical Management of Chronic Issues    Routine Visit     Allergies  Allergen Reactions  . Penicillins Anaphylaxis  . Shellfish Allergy   . Fish Allergy Hives   Advanced Directives 08/11/2016  Does Patient Have a Medical Advance Directive? Yes  Type of Advance Directive Out of facility DNR (pink MOST or yellow form);Healthcare Power of Attorney  Does patient want to make changes to medical advance directive? No - Patient declined  Copy of [place] in Chart? -  Pre-existing out of facility DNR order (yellow form or pink MOST form) -    HPI 83 y/o female patient seen for routine visit. She is under hospice care. She appears comfortable. She does not participate in HPI and ROS. She is OOB upto 3 days a week. She takes her medications per nursing. No fall has been reported. No pressure ulcer/ wound reported. She is under total care.   ROS Unable to obtain  Past Medical History:  Diagnosis Date  . Atrial fibrillation ([place])   . Cancer [facility]) 1998   left breast mastectomy chemo and radiation  . Cancer [facility]) 2014   right breast wide excision   . Carotid artery disease ([place])   . Diabetes ([place])   . DVT (deep venous thrombosis) ([place]) 1998  . Hernia   . High cholesterol   . Hypertension   . Hypothyroidism   . Parkinson disease ([place])   . Retinopathy    Allergies as of 09/19/2016      Reactions   Penicillins Anaphylaxis   Shellfish Allergy    Fish Allergy Hives      Medication List       Accurate as of 09/19/16  2:59 PM. Always use your most recent med list.          acetaminophen 325 MG tablet Commonly known as:  TYLENOL Take 650 mg by mouth every 6 (six) hours as needed for fever. Temp > 99.5   carbidopa-levodopa 25-100 MG tablet Commonly known as:  SINEMET  IR Take 1 tablet by mouth 3 (three) times daily. For parkinsons   escitalopram 10 MG tablet Commonly known as:  LEXAPRO Take 10 mg by mouth every morning. For depression   ipratropium-albuterol 0.5-2.5 (3) MG/3ML Soln Commonly known as:  DUONEB Take 3 mLs by nebulization every 6 (six) hours as needed. For shortness of breath and wheezing   OXYGEN Inhale 2 L into the lungs as needed. To maintain oxygen saturation greater than 90 % and shortness of breath   primidone 50 MG tablet Commonly known as:  MYSOLINE Take 50 mg by mouth 2 (two) times daily. For tremors   Propylene Glycol 0.6 % Soln Apply 1 drop to eye 2 (two) times daily.   QUEtiapine 25 MG tablet Commonly known as:  SEROQUEL Take 25 mg by mouth daily. For psychosis   QUEtiapine 50 MG tablet Commonly known as:  SEROQUEL Take 50 mg by mouth at bedtime. For psychosis   ranitidine 150 MG tablet Commonly known as:  ZANTAC Take 150 mg by mouth daily. For gerd   sennosides-docusate sodium 8.6-50 MG tablet Commonly known as:  SENOKOT-S Take 1 tablet by mouth at bedtime. For constipation   traMADol 50 MG tablet Commonly known as:  ULTRAM Take 25 mg by mouth every 12 (twelve) hours.   UNABLE TO FIND Med Name: Med Pass : Give 120 ml by mouth daily      Physical exam BP (!) 109/52   Pulse 76   Resp 16   Ht 5\' 3"  (1.6 m)   Wt 127 lb 3.2 oz (57.7 kg)   SpO2 90%   BMI 22.53 kg/m   Wt Readings from Last 3 Encounters:  09/19/16 127 lb 3.2 oz (57.7 kg)  08/11/16 126 lb (57.2 kg)  07/14/16 130 lb (59 kg)   General- elderly female in no acute distress, non verbal Head- atraumatic, normocephalic Eyes- no pallor, no icterus, no discharge Mouth- normal mucus membrane Cardiovascular- normal s1,s2, no murmur Respiratory- bilateral clear to auscultation Abdomen- bowel sounds present, soft, non tender, foley catheter in place Musculoskeletal-  trace leg edema, can move her extremities but weakness present, right wrist  contracture noted Neurological- resting hand tremors Skin- warm and dry, gerisleeve present, senile purpura present    Labs CBC Latest Ref Rng & Units 11/24/2015 12/18/2014 05/22/2014  WBC 10:3/mL 9.2 9.7 8.1  Hemoglobin 12.0 - 16.0 g/dL 10.8(A) 10.7(A) 10.9(A)  Hematocrit 36 - 46 % 36 33(A) 35(A)  Platelets 150 - 399 K/L - 302 209   CMP Latest Ref Rng & Units 11/24/2015 11/20/2015 12/18/2014  Glucose 65 - 99 mg/dL - - -  BUN 4 - 21 mg/dL 21 - 19  Creatinine 0.5 - 1.1 mg/dL 0.8 - 0.7  Sodium 137 - 147 mmol/L 140 - 141  Potassium 3.4 - 5.3 mmol/L - 4.7 4.5  Chloride 98 - 107 mmol/L - - -  CO2 21 - 32 mmol/L - - -  Calcium 8.5 - 10.1 mg/dL - - -  Total Protein 6.4 - 8.2 g/dL - - -  Total Bilirubin 0.2 - 1.0 mg/dL - - -  Alkaline Phos 25 - 125 U/L 80 - 57  AST 13 - 35 U/L - - 14  ALT 7 - 35 U/L 9 - 8     Assessment/plan  Parkinson's disease Continue sinemet IR 25-100 mg tid, provide supportive care, fall precautions. Continue primidone 50 mg bid to help with tremors  Chronic pain appears comfortable, on tramadol 25 mg bid, monitor  Dysphagia Continue pureed diet with nectar thickened liquid, aspiration precautions and assist with feeding  History of pressure ulcer Continue with preventative dressing to her sacral area. Frequent positioning. Gel overlay mattress and heel floaters in place  Anemia of chronic disease Appears clinically stable, check cbc and cmp  Neurogenic bladder Chronic, has indwelling foley catheter. Monitor urine output. Continue foley care. Maintain hydratrion  Dementia with behavior disturbance Advanced dementia. Supportive care. Currently on seroquel 25 mg in am and 50 mg qhs. Attempt GDR and change seroquel to 37.5 mg qhs and monitor. Continue escitalopram 10 mg daily for depression.   gerd Continue zantac 150 mg daily and monitor  Lab- cbc, cmp  Blanchie Serve, MD Internal Medicine [facility] [address] [place], [place] [address] Cell Phone (Monday-Friday 8 am - 5 pm): [phone] On Call: [phone] and follow prompts after 5 pm and on weekends Office Phone: [phone] Office Fax: [phone]

## 2016-09-27 LAB — BASIC METABOLIC PANEL
BUN: 13 mg/dL (ref 4–21)
CREATININE: 0.6 mg/dL (ref <=1.1)
Glucose: 80 mg/dL
Potassium: 4.8 mmol/L (ref 3.4–5.3)
SODIUM: 143 mmol/L (ref 137–147)

## 2016-09-27 LAB — CBC AND DIFFERENTIAL
HEMATOCRIT: 34 % — AB (ref 36–46)
Hemoglobin: 10.8 g/dL — AB (ref 12.0–16.0)
Platelets: 324 10*3/uL (ref 150–399)
WBC: 11.6 10^3/mL

## 2016-09-27 LAB — HEPATIC FUNCTION PANEL
ALK PHOS: 64 U/L (ref 25–125)
ALT: 4 U/L — AB (ref 7–35)
AST: 12 U/L — AB (ref 13–35)
Bilirubin, Total: 0.3 mg/dL

## 2016-10-05 ENCOUNTER — Non-Acute Institutional Stay (SKILLED_NURSING_FACILITY): Payer: Medicare Other | Admitting: Internal Medicine

## 2016-10-05 ENCOUNTER — Encounter: Payer: Self-pay | Admitting: Internal Medicine

## 2016-10-05 DIAGNOSIS — A498 Other bacterial infections of unspecified site: Secondary | ICD-10-CM

## 2016-10-05 DIAGNOSIS — D72825 Bandemia: Secondary | ICD-10-CM

## 2016-10-05 DIAGNOSIS — B964 Proteus (mirabilis) (morganii) as the cause of diseases classified elsewhere: Secondary | ICD-10-CM

## 2016-10-05 DIAGNOSIS — N319 Neuromuscular dysfunction of bladder, unspecified: Secondary | ICD-10-CM

## 2016-10-05 NOTE — Progress Notes (Signed)
Patient ID: Kristin Frey, female   DOB: 03/13/1930, 83 y.o.   MRN: [id number]     Facility: [facility]   Code status: DNR  Chief Complaint  Patient presents with  . Acute Visit    Abnornal Urinalysis    Allergies  Allergen Reactions  . Penicillins Anaphylaxis  . Shellfish Allergy   . Fish Allergy Hives   Advanced Directives 08/11/2016  Does Patient Have a Medical Advance Directive? Yes  Type of Advance Directive Out of facility DNR (pink MOST or yellow form);Healthcare Power of Attorney  Does patient want to make changes to medical advance directive? No - Patient declined  Copy of [place] in Chart? -  Pre-existing out of facility DNR order (yellow form or pink MOST form) -    HPI 83 y/o female patient seen for acute visit. She had a urine sample sent for study given cloudy appearance of her urine and leukocytosis. She has chronic foley catheter. Per nursing, her po intake is fair but she has poor hydration. Patient does not participate in HPI and ROS. She is under total care.   ROS Unable to obtain  Past Medical History:  Diagnosis Date  . Atrial fibrillation ([place])   . Cancer [facility]) 1998   left breast mastectomy chemo and radiation  . Cancer [facility]) 2014   right breast wide excision   . Carotid artery disease ([place])   . Diabetes ([place])   . DVT (deep venous thrombosis) ([place]) 1998  . Hernia   . High cholesterol   . Hypertension   . Hypothyroidism   . Parkinson disease ([place])   . Retinopathy    Allergies as of 10/05/2016      Reactions   Penicillins Anaphylaxis   Shellfish Allergy    Fish Allergy Hives      Medication List       Accurate as of 10/05/16  1:04 PM. Always use your most recent med list.          acetaminophen 325 MG tablet Commonly known as:  TYLENOL Take 650 mg by mouth every 6 (six) hours as needed for fever. Temp > 99.5   carbidopa-levodopa 25-100 MG tablet Commonly known as:  SINEMET IR Take 1  tablet by mouth 3 (three) times daily. For parkinsons   escitalopram 10 MG tablet Commonly known as:  LEXAPRO Take 10 mg by mouth every morning. For depression   ipratropium-albuterol 0.5-2.5 (3) MG/3ML Soln Commonly known as:  DUONEB Take 3 mLs by nebulization every 6 (six) hours as needed. For shortness of breath and wheezing   OXYGEN Inhale 2 L into the lungs as needed. To maintain oxygen saturation greater than 90 % and shortness of breath   primidone 50 MG tablet Commonly known as:  MYSOLINE Take 50 mg by mouth 2 (two) times daily. For tremors   Propylene Glycol 0.6 % Soln Apply 1 drop to eye 2 (two) times daily.   QUEtiapine 25 MG tablet Commonly known as:  SEROQUEL Take 25 mg by mouth every morning. For psychosis   QUEtiapine 50 MG tablet Commonly known as:  SEROQUEL Take 37.5 mg by mouth at bedtime. For psychosis   ranitidine 150 MG tablet Commonly known as:  ZANTAC Take 150 mg by mouth daily. For gerd   sennosides-docusate sodium 8.6-50 MG tablet Commonly known as:  SENOKOT-S Take 1 tablet by mouth at bedtime. For constipation   traMADol 50 MG tablet Commonly known as:  ULTRAM Take  25 mg by mouth every 12 (twelve) hours.   UNABLE TO FIND Med Name: Med Pass : Give 120 ml by mouth daily      Physical exam BP 140/76   Pulse 64   Temp 97.4 F (36.3 C) (Oral)   Resp 16   Ht 5\' 3"  (1.6 m)   Wt 127 lb 3.2 oz (57.7 kg)   SpO2 99%   BMI 22.53 kg/m   Wt Readings from Last 3 Encounters:  10/05/16 127 lb 3.2 oz (57.7 kg)  09/19/16 127 lb 3.2 oz (57.7 kg)  08/11/16 126 lb (57.2 kg)   General- elderly female in no acute distress, non verbal Head- atraumatic, normocephalic Mouth- moist mucus membrane Cardiovascular- normal s1,s2, no murmur Respiratory- bilateral clear to auscultation Abdomen- bowel sounds present, soft, non tender, foley catheter in place Musculoskeletal-  trace leg edema, right wrist contracture Skin- warm and dry, gerisleeve  present    Labs CBC Latest Ref Rng & Units 09/27/2016 11/24/2015 12/18/2014  WBC 10:3/mL 11.6 9.2 9.7  Hemoglobin 12.0 - 16.0 g/dL 10.8(A) 10.8(A) 10.7(A)  Hematocrit 36 - 46 % 34(A) 36 33(A)  Platelets 150 - 399 K/L 324 - 302   CMP Latest Ref Rng & Units 09/27/2016 11/24/2015 11/20/2015  Glucose 65 - 99 mg/dL - - -  BUN 4 - 21 mg/dL 13 21 -  Creatinine 0.5 - 1.1 mg/dL 0.6 0.8 -  Sodium 137 - 147 mmol/L 143 140 -  Potassium 3.4 - 5.3 mmol/L 4.8 - 4.7  Chloride 98 - 107 mmol/L - - -  CO2 21 - 32 mmol/L - - -  Calcium 8.5 - 10.1 mg/dL - - -  Total Protein 6.4 - 8.2 g/dL - - -  Total Bilirubin 0.2 - 1.0 mg/dL - - -  Alkaline Phos 25 - 125 U/L 64 80 -  AST 13 - 35 U/L 12(A) - -  ALT 7 - 35 U/L 4(A) 9 -     Assessment/plan  Proteus UTI Urine culture grew proteus mirabilis. Reviewed culture and sensitivity report. She is allergic to penicillin. Start ertapenem 1 g q24h x 1 week. continue to provide foley care. Encourage hydration 500 cc q shift for now. Check bmp. Will have mid line in place to help administer iv antibiotic.   Leukocytosis From her UTI. Starting antibiotic as above. Check cbc 10/10/16. Monitor temp curve.   Neurogenic bladder Chronic, has indwelling foley catheter. Monitor urine output. Continue foley care. Maintain hydratrion   Blanchie Serve, MD Internal Medicine [facility] [address] [place], [place] [address] Cell Phone (Monday-Friday 8 am - 5 pm): [phone] On Call: [phone] and follow prompts after 5 pm and on weekends Office Phone: [phone] Office Fax: [phone]

## 2016-10-11 ENCOUNTER — Non-Acute Institutional Stay (SKILLED_NURSING_FACILITY): Payer: Medicare Other | Admitting: Family

## 2016-10-11 ENCOUNTER — Encounter: Payer: Self-pay | Admitting: Family

## 2016-10-11 DIAGNOSIS — G20 Parkinson's disease: Secondary | ICD-10-CM

## 2016-10-11 DIAGNOSIS — R1319 Other dysphagia: Secondary | ICD-10-CM | POA: Diagnosis not present

## 2016-10-11 DIAGNOSIS — R339 Retention of urine, unspecified: Secondary | ICD-10-CM | POA: Diagnosis not present

## 2016-10-11 DIAGNOSIS — K219 Gastro-esophageal reflux disease without esophagitis: Secondary | ICD-10-CM | POA: Diagnosis not present

## 2016-10-11 DIAGNOSIS — F0281 Dementia in other diseases classified elsewhere with behavioral disturbance: Secondary | ICD-10-CM

## 2016-10-11 DIAGNOSIS — G20A1 Parkinson's disease without dyskinesia, without mention of fluctuations: Secondary | ICD-10-CM

## 2016-10-11 DIAGNOSIS — M159 Polyosteoarthritis, unspecified: Secondary | ICD-10-CM | POA: Diagnosis not present

## 2016-11-07 NOTE — Progress Notes (Signed)
Location:  [place] Room Number: [place] of Service:  SNF [phone])  Provider: Marlowe Sax FNP-C   PCP: Blanchie Serve, MD Patient Care Team: Blanchie Serve, MD as PCP - General (Internal Medicine) Bary Castilla Forest Gleason, MD (General Surgery)  Extended Emergency Contact Information Primary Emergency Contact: Schmidt,Shirley Address: [address], [place] [address] Johnnette Litter of [place] Phone: [phone] Mobile Phone: [phone] Relation: Daughter  Code Status: DNR Goals of care:  Advanced Directive information Advanced Directives 10/11/2016  Does Patient Have a Medical Advance Directive? Yes  Type of Paramedic of [place];Out of facility DNR (pink MOST or yellow form)  Does patient want to make changes to medical advance directive? No - Patient declined  Copy of [place] in Chart? Yes  Pre-existing out of facility DNR order (yellow form or pink MOST form) Pink MOST form placed in chart (order not valid for inpatient use);Yellow form placed in chart (order not valid for inpatient use)     Allergies  Allergen Reactions  . Penicillins Anaphylaxis  . Shellfish Allergy   . Fish Allergy Hives    Chief Complaint  Patient presents with  . Discharge Note    Discharge from [facility] to another SNF    HPI:  83 y.o. female seen today at [place] for discharge to [facility] in [place].She was here for Long term Care. She is currently on Hospice service due to progressive decline in condition due to advance dementia secondary to Parkinson's disease.She recently had leukocytosis and cloudy urine specimen was collected. Urine culture grew proteus mirabilis.MD ordered ertapenem 1 g q24h x 1 week to be given via a mid line but Patient's POA declined treatment.She is bed /wheelchair bound and requires total care assistance with her ADL's.She  does not require any DME. Discharge process will be arranged by facility social worker prior to discharge.she will be discharged with her medicationfrom the facility.Prescription medication will be written x 1 month then patient to follow up with PCP in 1-2 weeks. Facility staff report no new concerns.     Past Medical History:  Diagnosis Date  . Atrial fibrillation ([place])   . Cancer [facility]) 1998   left breast mastectomy chemo and radiation  . Cancer [facility]) 2014   right breast wide excision   . Carotid artery disease ([place])   . Diabetes ([place])   . DVT (deep venous thrombosis) ([place]) 1998  . Hernia   . High cholesterol   . Hypertension   . Hypothyroidism   . Parkinson disease ([place])   . Retinopathy     Past Surgical History:  Procedure Laterality Date  . APPENDECTOMY  1949  . BREAST SURGERY Left 1998   mastectomy  . BREAST SURGERY Right 2014   wide excision, sentinel node bx, mastoplasty,MammoSite  . CARPAL TUNNEL RELEASE Bilateral 1976  . CHOLECYSTECTOMY  2007  . EYE SURGERY  1985-1986      reports that she has never smoked. She has never used smokeless tobacco. She reports that she does not drink alcohol or use drugs. Social History   Social History  . Marital status: Married    Spouse name: N/A  . Number of children: 3  . Years of education: 12   Occupational History  . N/A    Social History Main Topics  . Smoking status: Never Smoker  .  Smokeless tobacco: Never Used  . Alcohol use No  . Drug use: No  . Sexual activity: No   Other Topics Concern  . Not on file   Social History Narrative   Patient lives at home alone.   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 Brother developed Parkinson's use to "chew coal" as a child.)   Caffeine Use: 2-3 cups caffeine daily    Allergies  Allergen Reactions  . Penicillins Anaphylaxis  . Shellfish Allergy   . Fish Allergy Hives    Pertinent  Health Maintenance Due  Topic  Date Due  . DEXA SCAN  06/14/2023 (Originally 10/18/1996)  . INFLUENZA VACCINE  01/11/2017  . PNA vac Low Risk Adult  Completed    Medications: Allergies as of 10/11/2016      Reactions   Penicillins Anaphylaxis   Shellfish Allergy    Fish Allergy Hives      Medication List       Accurate as of 10/11/16 11:59 PM. Always use your most recent med list.          acetaminophen 325 MG tablet Commonly known as:  TYLENOL Take 650 mg by mouth every 6 (six) hours as needed for fever. Temp > 99.5   carbidopa-levodopa 25-100 MG tablet Commonly known as:  SINEMET IR Take 1 tablet by mouth 3 (three) times daily. For parkinsons   escitalopram 10 MG tablet Commonly known as:  LEXAPRO Take 10 mg by mouth every morning. For depression   ipratropium-albuterol 0.5-2.5 (3) MG/3ML Soln Commonly known as:  DUONEB Take 3 mLs by nebulization every 6 (six) hours as needed. For shortness of breath and wheezing   NUTRITIONAL SUPPLEMENT Liqd Take by mouth. Med Pass 2.0 120 ml daily   OXYGEN Inhale 2 L into the lungs as needed. To maintain oxygen saturation greater than 90 % and shortness of breath   primidone 50 MG tablet Commonly known as:  MYSOLINE Take 50 mg by mouth 2 (two) times daily. For tremors   Propylene Glycol 0.6 % Soln Apply 1 drop to eye 2 (two) times daily.   QUEtiapine 25 MG tablet Commonly known as:  SEROQUEL Take 25 mg by mouth every morning. For psychosis   QUEtiapine 50 MG tablet Commonly known as:  SEROQUEL Take 37.5 mg by mouth at bedtime. For psychosis   ranitidine 150 MG tablet Commonly known as:  ZANTAC Take 150 mg by mouth daily. For gerd   sennosides-docusate sodium 8.6-50 MG tablet Commonly known as:  SENOKOT-S Take 1 tablet by mouth at bedtime. For constipation   traMADol 50 MG tablet Commonly known as:  ULTRAM Take 25 mg by mouth every 12 (twelve) hours.   UNABLE TO FIND Med Name: Med Pass : Give 120 ml by mouth daily       Review of Systems    Unable to perform ROS: Dementia    Vitals:   10/11/16 1253  BP: 135/60  Pulse: 63  Resp: 20  Temp: 97.3 F (36.3 C)  TempSrc: Oral  SpO2: 96%  Weight: 127 lb 3.2 oz (57.7 kg)  Height: 5\' 3"  (1.6 m)   Body mass index is 22.53 kg/m. Physical Exam  Constitutional: She appears well-developed and well-nourished. No distress.  Nonverbal during visit   HENT:  Head: Normocephalic.  Mouth/Throat: Oropharynx is clear and moist. No oropharyngeal exudate.  Eyes: Conjunctivae and EOM are normal. Pupils are equal, round, and reactive to light. Right eye  exhibits no discharge. Left eye exhibits no discharge. No scleral icterus.  Neck: Neck supple. No JVD present.  Cardiovascular: Normal rate, regular rhythm, normal heart sounds and intact distal pulses.  Exam reveals no gallop and no friction rub.   No murmur heard. Pulmonary/Chest: Effort normal and breath sounds normal. No respiratory distress. She has no wheezes. She has no rales.  Abdominal: Soft. Bowel sounds are normal. She exhibits no distension. There is no tenderness. There is no rebound and no guarding.  Genitourinary:  Genitourinary Comments: Foley Catheter draining adequate amounts of yellow urine.   Musculoskeletal: She exhibits no edema or tenderness.  Bilateral lower extremities weakness.   Lymphadenopathy:    She has no cervical adenopathy.  Neurological: She is alert.  Non-verbal but follows commands to turn in bed.   Skin: Skin is warm and dry. No rash noted. No erythema. No pallor.  Skin intact   Psychiatric: She has a normal mood and affect.    Labs reviewed: Basic Metabolic Panel:  Recent Labs  11/20/15 11/24/15 09/27/16  NA  --  140 143  K 4.7  --  4.8  BUN  --  21 13  CREATININE  --  0.8 0.6   Liver Function Tests:  Recent Labs  11/24/15 09/27/16  AST  --  12*  ALT 9 4*  ALKPHOS 80 64   CBC:  Recent Labs  11/24/15 09/27/16  WBC 9.2 11.6  HGB 10.8* 10.8*  HCT 36 34*  PLT  --  324    Assessment/Plan:     1. Parkinson's disease  Progressive decline expected.Continue on sinemet IR.Continue primidone for tremors Continue to assist with ADL's. Continue to follow up with hospice service.   2. Dysphagia, neurologic On pureed diet with nectar thickened liquid. Continue to assist with all meals. Aspiration precautions.   3. Gastroesophageal reflux disease without esophagitis Continue on Zantac 150 mg Tablet daily.   4. Dementia due to Parkinson's disease with behavioral disturbance Progressive decline expected. Continue on Seroquel and escitalopram.continue to assist with ADL's. Aspiration precaution. Skin care.    5. Urinary retention Chronic, has indwelling foley catheter.Had recent Urine culture which grew proteus mirabilis.MD ordered ertapenem 1 g q24h x 1 week to be given via a mid line.Patient's POA declined treatment. Continue to monitor urine output.Continue foley care. Maintain hydratrion  6. Generalized osteoarthritis Continue on Tylenol 325 mg Tablet every 6 hours as needed and Tramadol 25 mg Tablet every twelve hours.   Patient is being discharged with the following home health services:   -None required. Continue on Hospice services.   Patient is being discharged with the following durable medical equipment:   - None required.   Patient has been advised to f/u with their PCP in 1-2 weeks to for a transitions of care visit.Social services at their facility was responsible for arranging this appointment.  Pt was provided with adequate prescriptions of noncontrolled medications to reach the scheduled appointment.For controlled substances, a limited supply was provided as appropriate for the individual patient. If the pt normally receives these medications from a pain clinic or has a contract with another physician, these medications should be received from that clinic or physician only).    Future labs/tests needed:  CBC, BMP in 1-2 weeks PCP

## 2018-01-24 ENCOUNTER — Encounter: Payer: Self-pay | Admitting: Internal Medicine

## 2018-07-06 ENCOUNTER — Encounter: Payer: Self-pay | Admitting: Nurse Practitioner

## 2018-07-06 ENCOUNTER — Non-Acute Institutional Stay: Payer: Medicare Other | Admitting: Nurse Practitioner

## 2018-07-06 VITALS — HR 76 | Resp 20

## 2018-07-06 DIAGNOSIS — Z515 Encounter for palliative care: Secondary | ICD-10-CM

## 2018-07-06 DIAGNOSIS — R1310 Dysphagia, unspecified: Secondary | ICD-10-CM

## 2018-07-06 DIAGNOSIS — R5381 Other malaise: Secondary | ICD-10-CM | POA: Insufficient documentation

## 2018-07-06 NOTE — Progress Notes (Signed)
[facility] Telephone: [phone] Fax: [phone]  PATIENT NAME: DARWIN GUASTELLA DOB: 1930-03-29 MRN: [id number]  PRIMARY CARE PROVIDER:   Dr Orbie Pyo PROVIDER:  Dr Ronita [facility]  RESPONSIBLE PARTY:   Isaiah Serge daughter [phone]  ASSESSMENT:     I visited and observed Ms Bartley. She is chronically ill, debilitated. She did make eye contact with verbal cues though no verbal responses. She was cooperative with assessment. She does currently appear to be stable. DNR remains in place with medical goals focusing on Comfort. I have attempted to contact her daughter, [name]. I updated nursing staff no new changes to current goals of care.   Palliative care 8 / 29 / 2019 Hospice 7 / 29 / 2015 to 2 / 24 / 2017 Hospice 5 / 2 / 2018 to 8 / 15 / 2019  RECOMMENDATIONS and PLAN:  1. Palliative care encounter Z51.5; Palliative medicine team will continue to support patient, patient's family, and medical team. Visit consisted of counseling and education dealing with the complex and emotionally intense issues of symptom management and palliative care in the setting of serious and potentially life-threatening illness  2. Dysphagia R13.10; secondary to Parkinson/Lewy body dementia. Continue to monitor weights, appetite, aspiration precautions.   3. Debility R53.81 secondary to Parkinson/Lewy body dementia, encourage passive rom; encourage to transfer to geri-chair.   I spent 45 minutes providing this consultation,  from 2:00pm to 2:45pm  More than 50% of the time in this consultation was spent coordinating communication.   HISTORY OF PRESENT ILLNESS:  Brithany P Reinertsen is a 85 y.o. year old female with multiple medical problems including Parkinson's disease, Tremor, Lewy bodies dementia, atrial fibrillation, carotid artery disease, hypertension, hypothyroidism, DVT, diabetes, retinopathy, hyperlipidemia, anemia, left breast mastectomy with chemotherapy  radiation 1998 with right breast wide excision breast cancer 2014, gerd, neurogenic bladder, protein calorie malnutrition, history of hernia, eye surgery, cholecystectomy, appendectomy, bilateral carpal tunnel release. 5 / 3 / 2018 transferred from [facility] to [facility] on 5 / 1 / 2013 for Continued Care her daughter wanted to be closer to her so she could visit. She continues to reside at skilled long-term care facility. 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 She is total ADL care with incontinence. She does required to be fed and appetite has been Fair per staff. She has previously been under [employer] on two separate certifications but discharge due to stability. She is severely cognitively impaired, nonverbal. At present she is lying in the recliner, appears comfortable, debilitated. No visitors present . Palliative Care was asked to help address goals of care.   CODE STATUS: DNR  PPS: 30% HOSPICE ELIGIBILITY/DIAGNOSIS: TBD  PAST MEDICAL HISTORY:  Past Medical History:  Diagnosis Date  . Atrial fibrillation ([place])   . Cancer [facility]) 1998   left breast mastectomy chemo and radiation  . Cancer [facility]) 2014   right breast wide excision   . Carotid artery disease ([place])   . Diabetes ([place])   . DVT (deep venous thrombosis) ([place]) 1998  . Hernia   . High cholesterol   . Hypertension   . Hypothyroidism   . Parkinson disease ([place])   . Retinopathy     SOCIAL HX:  Social History   Tobacco Use  . Smoking status: Never Smoker  . Smokeless tobacco: Never Used  Substance Use Topics  . Alcohol use: No    ALLERGIES:  Allergies  Allergen Reactions  . Penicillins Anaphylaxis  .  Shellfish Allergy   . Fish Allergy Hives     PERTINENT MEDICATIONS:  Outpatient Encounter Medications as of 07/06/2018  Medication Sig  . acetaminophen (TYLENOL) 325 MG tablet Take 650 mg by mouth every 6 (six) hours as needed for fever. Temp > 99.5  . carbidopa-levodopa (SINEMET IR)  25-100 MG per tablet Take 1 tablet by mouth 3 (three) times daily. For parkinsons  . escitalopram (LEXAPRO) 10 MG tablet Take 10 mg by mouth every morning. For depression  . ipratropium-albuterol (DUONEB) 0.5-2.5 (3) MG/3ML SOLN Take 3 mLs by nebulization every 6 (six) hours as needed. For shortness of breath and wheezing  . NUTRITIONAL SUPPLEMENT LIQD Take by mouth. Med Pass 2.0 120 ml daily  . OXYGEN Inhale 2 L into the lungs as needed. To maintain oxygen saturation greater than 90 % and shortness of breath  . primidone (MYSOLINE) 50 MG tablet Take 50 mg by mouth 2 (two) times daily. For tremors  . Propylene Glycol 0.6 % SOLN Apply 1 drop to eye 2 (two) times daily.   . QUEtiapine (SEROQUEL) 25 MG tablet Take 25 mg by mouth every morning. For psychosis  . QUEtiapine (SEROQUEL) 50 MG tablet Take 37.5 mg by mouth at bedtime. For psychosis  . ranitidine (ZANTAC) 150 MG tablet Take 150 mg by mouth daily. For gerd  . sennosides-docusate sodium (SENOKOT-S) 8.6-50 MG tablet Take 1 tablet by mouth at bedtime. For constipation  . traMADol (ULTRAM) 50 MG tablet Take 25 mg by mouth every 12 (twelve) hours.   Marland Kitchen UNABLE TO FIND Med Name: Med Pass : Give 120 ml by mouth daily   No facility-administered encounter medications on file as of 07/06/2018.     PHYSICAL EXAM:   General: NAD, frail appearing, thin; debilitated female Cardiovascular: regular rate and rhythm Pulmonary: clear ant fields Abdomen: soft, nontender, + bowel sounds GU: no suprapubic tenderness Extremities: no edema, contractures Skin: no rashes/pale Neurological: Weakness but otherwise nonfocal/functional quadriplegic  Tanice Petre Ihor Gully, NP

## 2018-08-08 ENCOUNTER — Encounter: Payer: Self-pay | Admitting: Nurse Practitioner

## 2018-08-08 ENCOUNTER — Non-Acute Institutional Stay: Payer: Medicare Other | Admitting: Nurse Practitioner

## 2018-08-08 VITALS — HR 78 | Temp 97.9°F | Resp 18 | Wt 144.3 lb

## 2018-08-08 DIAGNOSIS — Z515 Encounter for palliative care: Secondary | ICD-10-CM

## 2018-08-08 DIAGNOSIS — R1310 Dysphagia, unspecified: Secondary | ICD-10-CM

## 2018-08-08 DIAGNOSIS — R5381 Other malaise: Secondary | ICD-10-CM | POA: Insufficient documentation

## 2018-08-08 NOTE — Progress Notes (Signed)
[facility] Consult Note Telephone: [phone]  Fax: [phone]  PATIENT NAME: Kristin Frey DOB: [birth year] MRN: [id number]  PRIMARY CARE PROVIDER:   Dr Orbie Pyo PROVIDER:  Dr [facility] RESPONSIBLE PARTY:  Isaiah Serge daughter [phone]  RECOMMENDATIONS and PLAN:  1. Palliative care encounter Z51.5; Palliative medicine team will continue to support patient, patient's family, and medical team. Visit consisted of counseling and education dealing with the complex and emotionally intense issues of symptom management and palliative care in the setting of serious and potentially life-threatening illness  2. Dysphagia R13.10; secondary to Parkinson/Lewy body dementia. Continue to monitor weights, appetite, aspiration precautions.   3. Debility R53.81 secondary to Parkinson/Lewy body dementia, encourage passive rom; encourage to transfer to geri-chair.   ASSESSMENT:     I visited and observed Kristin Frey. She did make eye contact with verbal cues. No meaningful discussion due to cognitive impairment. She was carpeted with assessment. She has not had a significant weight loss as a matter of fact she actually has had a 1.3 weight gain in the last 2 weeks. She does appear to be functionally and cognitively severely debilitated. She does remain a DNR with goals have been focused on comfort. Daughter, [name] per staff's wishes are to re-visit hospice services. I have called and left a message for [name] to return call for update on PC visit. At present time even though functional decline will need more information from daughter, clinically without weight loss, wounds, infections she continues to appear stable and would not meet criteria for hospice screening at this time  02/13/2018 weight 138.2 lb 05/09/2018 weight 146.2 lb 07/13/2018 weight 143.0 lb 07/23/2018 weight 144.3 lb  Palliative care 8 / 29 / 2019 Hospice 7 /  29 / 2015 to 2 / 24 / 2017 Hospice 5 / 2 / 2018 to 8 / 15 / 2019  I spent 50 minutes providing this consultation,  from 1:30pm to 2:20pm. More than 50% of the time in this consultation was spent coordinating communication.   HISTORY OF PRESENT ILLNESS:  Kristin Frey is a 85 y.o. year old female with multiple medical problems including Parkinson's disease, Tremor, Lewy bodies dementia, atrial fibrillation, carotid artery disease, hypertension, hypothyroidism, DVT, diabetes, retinopathy, hyperlipidemia, anemia, left breast mastectomy with chemotherapy radiation 1998 with right breast wide excision breast cancer 2014, gerd, neurogenic bladder, protein calorie malnutrition, history of hernia, eye surgery, cholecystectomy, appendectomy, bilateral carpal tunnel release. Kristin Frey continues to reside in skilled [facility]. She remains bed-bound, total lift to the chair functioning quadriplegic with contractures. She is total ADL dependent with incontinence bowel and bladder. She does require to be fed. Daughter asked staff to have Kristin Frey re-evaluated for possible Hospice Services as she feels like she is continuing to decline overall. She is severely cognitively impaired. No recent wounds, infections, hospitalizations, Falls. At present Kristin Frey's lying in the geri chair in the hall at the nurses station. She appears comfortable though debilitated. No visitors present. Palliative Care was asked to help address goals of care.   CODE STATUS: DNR  PPS: 30% HOSPICE ELIGIBILITY/DIAGNOSIS: TBD  PAST MEDICAL HISTORY:  Past Medical History:  Diagnosis Date  . Atrial fibrillation ([place])   . Cancer [facility]) 1998   left breast mastectomy chemo and radiation  . Cancer [facility]) 2014   right breast wide excision   . Carotid artery disease ([place])   . Diabetes ([place])   . DVT (  deep venous thrombosis) ([place]) 1998  . Hernia   . High cholesterol   . Hypertension   . Hypothyroidism     . Parkinson disease ([place])   . Retinopathy     SOCIAL HX:  Social History   Tobacco Use  . Smoking status: Never Smoker  . Smokeless tobacco: Never Used  Substance Use Topics  . Alcohol use: No    ALLERGIES:  Allergies  Allergen Reactions  . Penicillins Anaphylaxis  . Shellfish Allergy   . Fish Allergy Hives     PERTINENT MEDICATIONS:  Outpatient Encounter Medications as of 08/08/2018  Medication Sig  . acetaminophen (TYLENOL) 325 MG tablet Take 650 mg by mouth every 6 (six) hours as needed for fever. Temp > 99.5  . carbidopa-levodopa (SINEMET IR) 25-100 MG per tablet Take 1 tablet by mouth 3 (three) times daily. For parkinsons  . escitalopram (LEXAPRO) 10 MG tablet Take 10 mg by mouth every morning. For depression  . ipratropium-albuterol (DUONEB) 0.5-2.5 (3) MG/3ML SOLN Take 3 mLs by nebulization every 6 (six) hours as needed. For shortness of breath and wheezing  . NUTRITIONAL SUPPLEMENT LIQD Take by mouth. Med Pass 2.0 120 ml daily  . OXYGEN Inhale 2 L into the lungs as needed. To maintain oxygen saturation greater than 90 % and shortness of breath  . primidone (MYSOLINE) 50 MG tablet Take 50 mg by mouth 2 (two) times daily. For tremors  . Propylene Glycol 0.6 % SOLN Apply 1 drop to eye 2 (two) times daily.   . QUEtiapine (SEROQUEL) 25 MG tablet Take 25 mg by mouth every morning. For psychosis  . QUEtiapine (SEROQUEL) 50 MG tablet Take 37.5 mg by mouth at bedtime. For psychosis  . ranitidine (ZANTAC) 150 MG tablet Take 150 mg by mouth daily. For gerd  . sennosides-docusate sodium (SENOKOT-S) 8.6-50 MG tablet Take 1 tablet by mouth at bedtime. For constipation  . traMADol (ULTRAM) 50 MG tablet Take 25 mg by mouth every 12 (twelve) hours.   Marland Kitchen UNABLE TO FIND Med Name: Med Pass : Give 120 ml by mouth daily   No facility-administered encounter medications on file as of 08/08/2018.     PHYSICAL EXAM:   General: NAD, frail appearing, thin severely debilitated  functionally/cognitively female Cardiovascular: regular rate and rhythm Pulmonary: clear ant fields Abdomen: soft, nontender, + bowel sounds GU: no suprapubic tenderness Extremities: no edema, contractures Neurological: Weakness but otherwise nonfocal/functional quadriplegic  Jacyln Carmer Ihor Gully, NP

## 2018-08-09 ENCOUNTER — Telehealth: Payer: Self-pay | Admitting: Nurse Practitioner

## 2018-08-09 NOTE — Telephone Encounter (Signed)
I called Kristin Frey, Kristin Frey. We talked about purpose her palliative care visit. Verbal consent obtained. We talked about this it was Kristin Frey. We talked about progression of chronic disease of Lewy bodies dementia in the setting of natural aging. We talked about her appetite which has been good. We talked about her weight which has been stable and 5-day weight gain. We talked about symptoms. We talked about cognitive impairment and functional ability of which she is severe. We talked about the last time she was on Hospice services that discharged due to stability. We talked about Medicare criteria for hospice. We talked about expectations for decline. Discuss that with weight gain and would need to lose 10% of body weight, wounds, infections, pocketing food which she it's not exhibiting at present time. Discussed role palliative care and plan of care. We talked about medical goals of care including comfort which are Kristin Frey's wishes. DNR does remain in place. Discuss will follow up in 4 weeks if needed or sooner should she declined. We talked about challenges for skilled facility and encouraged Kristin Frey to continue with positive open communication with staff. Therapeutic listening and emotional support provided. Discussed coping strategies. Questions answer satisfaction. Contact information provided.  Total time spent 35 minutes  Documentation 10 minutes  Phone discussion 25 minutes

## 2018-08-22 ENCOUNTER — Other Ambulatory Visit: Payer: Self-pay

## 2018-08-22 ENCOUNTER — Non-Acute Institutional Stay: Payer: Medicare Other | Admitting: Primary Care

## 2018-08-22 DIAGNOSIS — G3183 Dementia with Lewy bodies: Secondary | ICD-10-CM

## 2018-08-22 DIAGNOSIS — R5381 Other malaise: Secondary | ICD-10-CM

## 2018-08-22 DIAGNOSIS — Z515 Encounter for palliative care: Secondary | ICD-10-CM

## 2018-08-22 DIAGNOSIS — R1319 Other dysphagia: Secondary | ICD-10-CM

## 2018-08-22 DIAGNOSIS — F0280 Dementia in other diseases classified elsewhere without behavioral disturbance: Secondary | ICD-10-CM

## 2018-08-22 NOTE — Progress Notes (Signed)
[facility] Consult Note Telephone: [phone]  Fax: [phone]  PATIENT NAME: Kristin Frey DOB: 21-Dec-1929 MRN: [id number]  PRIMARY CARE PROVIDER:  Dr Orbie Pyo PROVIDER:  Dr. Lovie Macadamia [address] [place] [address]  RESPONSIBLE PARTY:   Extended Emergency Contact Information Primary Emergency Contact: Schmidt,Shirley Address: [address]          [place],  [address] Johnnette Litter of [place] Phone: [phone] Mobile Phone: [phone] Relation: Daughter   ASSESSMENT and RECOMMENDATIONS:   1.Goals of Care: DNR,MOST with  limited limited interventions, limited abx and iv, no feeding tube. Family not available at visit but will f/u with phone call to them.   2. Debility: Staff report no new concerns or changes. State patient is at her functional baseline.  She eats well, being fed, and in fact has gained weight while in the facility. She has not had any recent skin breakdown. She will regard people, and attempts some conversation with the caregivers she is familiar with.  No obvious s/sx of decline at this assessment.  Palliative care will continue to follow for goals of care clarification, symptom management. Return 6 weeks or prn decline.  I spent 15 minutes providing this consultation,  from 1115 to 1130. More than 50% of the time in this consultation was spent coordinating communication.   HISTORY OF PRESENT ILLNESS:  Kristin Frey is a 85 y.o. year old female with multiple medical problems including Afib, HTN, Parkinson's, dementia. Palliative Care was asked to help address goals of care.   CODE STATUS: DNR,MOST with  limited limited interventions, limited abx and iv, no feeding tube.  PPS: 30% HOSPICE ELIGIBILITY/DIAGNOSIS: TBD  PAST MEDICAL HISTORY:  Past Medical History:  Diagnosis Date  . Atrial fibrillation ([place])   . Cancer [facility]) 1998   left breast mastectomy chemo and radiation   . Cancer [facility]) 2014   right breast wide excision   . Carotid artery disease ([place])   . Diabetes ([place])   . DVT (deep venous thrombosis) ([place]) 1998  . Hernia   . High cholesterol   . Hypertension   . Hypothyroidism   . Parkinson disease ([place])   . Retinopathy     SOCIAL HX:  Social History   Tobacco Use  . Smoking status: Never Smoker  . Smokeless tobacco: Never Used  Substance Use Topics  . Alcohol use: No    ALLERGIES:  Allergies  Allergen Reactions  . Penicillins Anaphylaxis  . Shellfish Allergy   . Fish Allergy Hives     PERTINENT MEDICATIONS:  Outpatient Encounter Medications as of 08/22/2018  Medication Sig  . acetaminophen (TYLENOL) 325 MG tablet Take 650 mg by mouth every 6 (six) hours as needed for fever. Temp > 99.5  . carbidopa-levodopa (SINEMET IR) 25-100 MG per tablet Take 1 tablet by mouth 3 (three) times daily. For parkinsons  . escitalopram (LEXAPRO) 10 MG tablet Take 10 mg by mouth every morning. For depression  . ipratropium-albuterol (DUONEB) 0.5-2.5 (3) MG/3ML SOLN Take 3 mLs by nebulization every 6 (six) hours as needed. For shortness of breath and wheezing  . NUTRITIONAL SUPPLEMENT LIQD Take by mouth. Med Pass 2.0 120 ml daily  . OXYGEN Inhale 2 L into the lungs as needed. To maintain oxygen saturation greater than 90 % and shortness of breath  . primidone (MYSOLINE) 50 MG tablet Take 50 mg by mouth 2 (two) times daily. For tremors  . Propylene Glycol 0.6 %  SOLN Apply 1 drop to eye 2 (two) times daily.   . QUEtiapine (SEROQUEL) 25 MG tablet Take 25 mg by mouth every morning. For psychosis  . QUEtiapine (SEROQUEL) 50 MG tablet Take 37.5 mg by mouth at bedtime. For psychosis  . ranitidine (ZANTAC) 150 MG tablet Take 150 mg by mouth daily. For gerd  . sennosides-docusate sodium (SENOKOT-S) 8.6-50 MG tablet Take 1 tablet by mouth at bedtime. For constipation  . traMADol (ULTRAM) 50 MG tablet Take 25 mg by mouth every 12 (twelve) hours.   Marland Kitchen UNABLE TO FIND Med  Name: Med Pass : Give 120 ml by mouth daily   No facility-administered encounter medications on file as of 08/22/2018.     PHYSICAL EXAM:   VS 97.5  74-18  95% 136/68       Wt. 148.6. Pt has gained weight while at SNF. General: NAD, frail appearing, WNWD Cardiovascular: regular rate and rhythm, S1S2S4 Pulmonary: clear all fields, no cough Abdomen: soft, nontender, + bowel sounds, BMs regular  Extremities: no edema, UE Contractures, LE drop foot Skin: no rashes,no wounds Neurological: Weakness dementia, non verbal,   Cyndia Skeeters DNP, AGPCNP-BC

## 2018-10-17 ENCOUNTER — Other Ambulatory Visit: Payer: Self-pay

## 2018-10-17 ENCOUNTER — Non-Acute Institutional Stay: Payer: Medicare Other | Admitting: Primary Care

## 2018-10-17 DIAGNOSIS — Z515 Encounter for palliative care: Secondary | ICD-10-CM

## 2018-10-17 NOTE — Progress Notes (Signed)
[facility] Consult Note Telephone: [phone]  Fax: [phone]   TELEHEALTH VISIT STATEMENT Due to the COVID-19 crisis, this visit was done via telemedicine from my office. It was initiated and consented to by this patient and/or family.  PATIENT NAME: Kristin Frey DOB: 11/19/1929 MRN: [id number]  PRIMARY CARE PROVIDER:   Juluis Pitch, MD  REFERRING PROVIDER:  Juluis Pitch, MD [address] [place], [place] [address]  RESPONSIBLE PARTY:  Extended Emergency Contact Information Primary Emergency Contact: Kristin Frey Address: [place], [place] [address] Johnnette Litter of [place] Mobile Phone: [phone] Relation: Daughter  Palliative Care was asked to follow patient by consultation request of Dr. Juluis Pitch, MD. This is a follow up visit.  ASSESSMENT and RECOMMENDATIONS:   1.Goals of Care: DNR, MOST with  limited limited interventions, limited abx and iv, no feeding tube. Family called after video visit, spoke with daughter Kristin Frey. Reviewed these directives and these are  the same wishes.   2. Debility: Continue current regimen. Staff report no new concerns or changes. State patient is at her functional baseline. She eats well, being fed, and in fact has gained weight while in the facility. She has not had any recent skin breakdown. She will regard people, and attempts some conversation with the caregivers she is familiar with.  No obvious s/sx of decline at this assessment.  3. UTI: Daughter states  h/o UTI 3 weeks ago, resolved with antibiotic. Does not have symptoms currently.  4. Mentation:  Increase stimulation via activities, visits when possible. Daughter is concerned about decreased social interactions. Needs to be oob and in chair to activities, when they resume.  Palliative care will continue to follow for goals of care clarification and symptom management. Return 6-8 weeks or prn.   I spent 25 minutes providing this consultation,  from 1415 to 1440. More than 50% of the time in this consultation was spent coordinating communication.   HISTORY OF PRESENT ILLNESS:  Kristin Frey is a 85 y.o. year old female with multiple medical problems including Afib, HTN, Parkinson's,Lewy Body  dementia.. Palliative Care was asked to help address goals of care.   CODE STATUS: DNR, limited MOST  PPS: 30% HOSPICE ELIGIBILITY/DIAGNOSIS: TBD  PAST MEDICAL HISTORY:  Past Medical History:  Diagnosis Date  . Atrial fibrillation ([place])   . Cancer [facility]) 1998   left breast mastectomy chemo and radiation  . Cancer [facility]) 2014   right breast wide excision   . Carotid artery disease ([place])   . Diabetes ([place])   . DVT (deep venous thrombosis) ([place]) 1998  . Hernia   . High cholesterol   . Hypertension   . Hypothyroidism   . Parkinson disease ([place])   . Retinopathy     SOCIAL HX:  Social History   Tobacco Use  . Smoking status: Never Smoker  . Smokeless tobacco: Never Used  Substance Use Topics  . Alcohol use: No    ALLERGIES:  Allergies  Allergen Reactions  . Penicillins Anaphylaxis  . Shellfish Allergy   . Fish Allergy Hives     PERTINENT MEDICATIONS:  Outpatient Encounter Medications as of 10/17/2018  Medication Sig  . acetaminophen (TYLENOL) 325 MG tablet Take 650 mg by mouth every 6 (six) hours as needed for fever. Temp > 99.5  . carbidopa-levodopa (SINEMET IR) 25-100 MG per tablet Take 1 tablet by mouth 3 (three) times daily. For parkinsons  .  escitalopram (LEXAPRO) 10 MG tablet Take 10 mg by mouth every morning. For depression  . ipratropium-albuterol (DUONEB) 0.5-2.5 (3) MG/3ML SOLN Take 3 mLs by nebulization every 6 (six) hours as needed. For shortness of breath and wheezing  . NUTRITIONAL SUPPLEMENT LIQD Take by mouth. Med Pass 2.0 120 ml daily  . OXYGEN Inhale 2 L into the lungs as needed. To maintain oxygen saturation greater than 90 % and shortness of breath  .  primidone (MYSOLINE) 50 MG tablet Take 50 mg by mouth 2 (two) times daily. For tremors  . Propylene Glycol 0.6 % SOLN Apply 1 drop to eye 2 (two) times daily.   . QUEtiapine (SEROQUEL) 25 MG tablet Take 25 mg by mouth every morning. For psychosis  . QUEtiapine (SEROQUEL) 50 MG tablet Take 37.5 mg by mouth at bedtime. For psychosis  . ranitidine (ZANTAC) 150 MG tablet Take 150 mg by mouth daily. For gerd  . sennosides-docusate sodium (SENOKOT-S) 8.6-50 MG tablet Take 1 tablet by mouth at bedtime. For constipation  . traMADol (ULTRAM) 50 MG tablet Take 25 mg by mouth every 12 (twelve) hours.   Marland Kitchen UNABLE TO FIND Med Name: Med Pass : Give 120 ml by mouth daily   No facility-administered encounter medications on file as of 10/17/2018.     PHYSICAL EXAM:   Wt 153, wt gain, eating all of food  General: NAD, frail appearing,WNWD Cardiovascular: no distress noted Pulmonary: no cough,does not use oxygen Abdomen: eats very well and gains weight, has senna bms.  GU: incontinent Extremities: no edema, UE  joint contractures, foot drop per report. Skin: no rashes, lesions, wounds Neurological: Weakness, advanced dementia, staff states she does not cry or show agitation. Occ. speaks to known staff.   Kristin Skeeters DNP AGPCNP-BC

## 2018-12-12 ENCOUNTER — Non-Acute Institutional Stay: Payer: Medicare Other | Admitting: Primary Care

## 2018-12-19 ENCOUNTER — Non-Acute Institutional Stay: Payer: Medicare Other | Admitting: Primary Care

## 2018-12-26 ENCOUNTER — Non-Acute Institutional Stay: Payer: Medicare Other | Admitting: Primary Care

## 2018-12-26 ENCOUNTER — Other Ambulatory Visit: Payer: Self-pay

## 2018-12-26 DIAGNOSIS — Z515 Encounter for palliative care: Secondary | ICD-10-CM

## 2018-12-26 NOTE — Progress Notes (Signed)
[facility] Consult Note Telephone: [phone]  Fax: [phone]  TELEHEALTH VISIT STATEMENT Due to the COVID-19 crisis, this visit was done via telemedicine from my office. It was initiated and consented to by this patient and/or family.  PATIENT NAME: Kristin Frey DOB: 26-Mar-1930 MRN: [id number]  PRIMARY CARE PROVIDER:   Juluis Pitch, [place]  REFERRING PROVIDER:  Juluis Pitch, [place],  [place] [address] [phone]  RESPONSIBLE PARTY:   Extended Emergency Contact Information Primary Emergency Contact: Kristin Frey Address: [place], [place] [address] Kristin Frey of [place] Mobile Phone: [phone] Relation: Daughter  Palliative Care was asked to follow this patient by consultation request of Juluis Pitch, MD. This is a follow up visit. Spoke with Legrand Como at [facility].  ASSESSMENT AND RECOMMENDATIONS:   1. Goals of Care: Maximize quality of life and symptom management.  2. Symptom Management:   Constipation: Recommend increasing senna to 2 tabs bid, and Miralax 17 mg q am. Now at  Day 7 of BM protocol, large and medium bms today and last night and has history of bm protocols  Needed regularly. Constipation increases risk of UTI.  UTI: Continue to increase fluids, bowel regularity to prevent.None recently, last in April, 2020.  Agitation: Recent increase in lexapro by psychiatry. Continue to monitor mood. Agitation denied by staff.  3. Family /Caregiver/Community Supports: Daughter is POA, not able to visit with Covid.  4. Cognitive / Functional decline: Seen by psych, increased lexapro.  5. Advanced Care Directive: DNR, MOST with limited limited interventions, limited abx and iv, no feeding tube  6. Follow up Palliative Care Visit: Palliative care will continue to follow for goals of care clarification and symptom management. Return 4-6 weeks or prn.  I spent  15 minutes providing this consultation,  from 1730 to 1745. More than 50% of the time in this consultation was spent coordinating communication.   HISTORY OF PRESENT ILLNESS:  Kristin Frey is a 85 y.o. year old female with multiple medical problems including h/o UTI,  Afib, HTN, Parkinson's,Lewy Body. Palliative Care was asked to help address goals of care.   CODE STATUS: DNR, MOST with limited limited interventions, limited abx and iv, no feeding tube  PPS: 30% HOSPICE ELIGIBILITY/DIAGNOSIS: TBD  PAST MEDICAL HISTORY:  Past Medical History:  Diagnosis Date  . Atrial fibrillation ([place])   . Cancer [facility]) 1998   left breast mastectomy chemo and radiation  . Cancer [facility]) 2014   right breast wide excision   . Carotid artery disease ([place])   . Diabetes ([place])   . DVT (deep venous thrombosis) ([place]) 1998  . Hernia   . High cholesterol   . Hypertension   . Hypothyroidism   . Parkinson disease ([place])   . Retinopathy     SOCIAL HX:  Social History   Tobacco Use  . Smoking status: Never Smoker  . Smokeless tobacco: Never Used  Substance Use Topics  . Alcohol use: No    ALLERGIES:  Allergies  Allergen Reactions  . Penicillins Anaphylaxis  . Shellfish Allergy   . Fish Allergy Hives     PERTINENT MEDICATIONS:  Outpatient Encounter Medications as of 12/26/2018  Medication Sig  . acetaminophen (TYLENOL) 325 MG tablet Take 650 mg by mouth every 6 (six) hours as needed for fever. Temp > 99.5  . carbidopa-levodopa (SINEMET IR) 25-100 MG per tablet Take 1 tablet by mouth  3 (three) times daily. For parkinsons  . escitalopram (LEXAPRO) 10 MG tablet Take 10 mg by mouth every morning. For depression  . ipratropium-albuterol (DUONEB) 0.5-2.5 (3) MG/3ML SOLN Take 3 mLs by nebulization every 6 (six) hours as needed. For shortness of breath and wheezing  . NUTRITIONAL SUPPLEMENT LIQD Take by mouth. Med Pass 2.0 120 ml daily  . OXYGEN Inhale 2 L into the lungs as needed. To maintain oxygen  saturation greater than 90 % and shortness of breath  . primidone (MYSOLINE) 50 MG tablet Take 50 mg by mouth 2 (two) times daily. For tremors  . Propylene Glycol 0.6 % SOLN Apply 1 drop to eye 2 (two) times daily.   . QUEtiapine (SEROQUEL) 25 MG tablet Take 25 mg by mouth every morning. For psychosis  . QUEtiapine (SEROQUEL) 50 MG tablet Take 37.5 mg by mouth at bedtime. For psychosis  . ranitidine (ZANTAC) 150 MG tablet Take 150 mg by mouth daily. For gerd  . sennosides-docusate sodium (SENOKOT-S) 8.6-50 MG tablet Take 1 tablet by mouth at bedtime. For constipation  . traMADol (ULTRAM) 50 MG tablet Take 25 mg by mouth every 12 (twelve) hours.   Marland Kitchen UNABLE TO FIND Med Name: Med Pass : Give 120 ml by mouth daily   No facility-administered encounter medications on file as of 12/26/2018.     PHYSICAL EXAM/ROS:   Current and past weights: 153.4 lg, 11/30/2018. Weight 9/19 was 138.2 lb. General: NAD, frail Cardiovascular: no chest pain reported, no edema,  Pulmonary: no cough, no increased SOB Abdomen: appetite fair, endorses constipation, recent laxative given, incontinent of bowel GU: denies dysuria, incontinent of urine MSK:  OOB with lift, in chair during day.  Skin: no rashes or wounds reported Neurological: Weakness, Lewy body dementia, non verbal,   Cyndia Skeeters DNP AGPCNP-BC

## 2019-02-26 ENCOUNTER — Non-Acute Institutional Stay: Payer: Medicare Other | Admitting: Primary Care

## 2019-02-26 ENCOUNTER — Other Ambulatory Visit: Payer: Self-pay

## 2019-02-26 DIAGNOSIS — Z515 Encounter for palliative care: Secondary | ICD-10-CM

## 2019-02-26 NOTE — Progress Notes (Signed)
[facility] Consult Note Telephone: [phone]  Fax: [phone]  TELEHEALTH VISIT STATEMENT Due to the COVID-19 crisis, this visit was done via telemedicine from my office. It was initiated and consented to by this patient and/or family.  PATIENT NAME: Kristin Frey DOB: [birth year] MRN: [id number]  PRIMARY CARE PROVIDER:   Juluis Pitch, MD, [address] [place] [address] [phone]  REFERRING PROVIDER:  Juluis Pitch, MD [address] [place],  [place] [address] [phone]  RESPONSIBLE PARTY:   Extended Emergency Contact Information Primary Emergency Contact: Schmidt,Shirley Address: [place], [place] [address] Johnnette Litter of [place] Mobile Phone: [phone] Relation: Daughter   ASSESSMENT AND RECOMMENDATIONS:   1. Advance Care Planning/Goals of Care: Goals include to maximize quality of life and symptom management.Talked to POA daughter and discussed RE her mother's care.  She asked about alertness, being out of bed, and current treatment for squamous cell ca. DNR, MOST with limited limited interventions, limited abx and iv, no feeding tube.  2. Symptom Management:   Skin: Daughter voiced concerns RE medication irritation. None yet reported. Squamous cell carcinoma of skin, on back, not able to remove yet, Rx with Efudex cream to back currently until recheck. Return to clinic in 3 weeks.  Nutrition: Continues to eat 100% of meals.  No recent weight available but appears WNWD.   Mood: Staff reports stable, daughter is concerned but feels her current med regimen is keeping her in a good mood place.   3. Family /Caregiver/Community Supports: Talked to daughter who states she Would like pt  to be dressed daily and not wear night gown. Lives in SNF with round the clock nursing.  4. Cognitive / Functional decline: Appears to be at baseline for mood, family states she cries and is anxious when her  daughter visits. She of course has not been able to since the [facility]. She would like her to stay on mood stabilizers. Family would like her up daily in the chair.   5. Follow up Palliative Care Visit: Palliative care will continue to follow for goals of care clarification and symptom management. Return 8 weeks or prn.  I spent 25 minutes providing this consultation,  from 1400 to 1430. More than 50% of the time in this consultation was spent coordinating communication.   HISTORY OF PRESENT ILLNESS:  Kristin Frey is a 85 y.o. year old female with multiple medical problems including lewy body dementia, parkinsons. Palliative Care was asked to follow this patient by consultation request of Juluis Pitch, MD to help address advance care planning and goals of care. This is a follow up visit.  CODE STATUS: DNR, MOST with limited limited interventions, limited abx and iv, no feeding tube.  PPS: 30% HOSPICE ELIGIBILITY/DIAGNOSIS: TBD  PAST MEDICAL HISTORY:  Past Medical History:  Diagnosis Date   Atrial fibrillation ([place])    Cancer ([place]) 1998   left breast mastectomy chemo and radiation   Cancer ([place]) 2014   right breast wide excision    Carotid artery disease ([facility])    Diabetes ([place])    DVT (deep venous thrombosis) ([place]) 1998   Hernia    High cholesterol    Hypertension    Hypothyroidism    Parkinson disease ([place])    Retinopathy     SOCIAL HX:  Social History   Tobacco Use   Smoking status: Never Smoker   Smokeless tobacco: Never  Used  Substance Use Topics   Alcohol use: No    ALLERGIES:  Allergies  Allergen Reactions   Penicillins Anaphylaxis   Shellfish Allergy    Fish Allergy Hives     PERTINENT MEDICATIONS:  Outpatient Encounter Medications as of 02/26/2019  Medication Sig   acetaminophen (TYLENOL) 325 MG tablet Take 650 mg by mouth every 6 (six) hours as needed for fever. Temp > 99.5   carbidopa-levodopa (SINEMET IR) 25-100 MG per  tablet Take 1 tablet by mouth 3 (three) times daily. For parkinsons   escitalopram (LEXAPRO) 10 MG tablet Take 10 mg by mouth every morning. For depression   ipratropium-albuterol (DUONEB) 0.5-2.5 (3) MG/3ML SOLN Take 3 mLs by nebulization every 6 (six) hours as needed. For shortness of breath and wheezing   NUTRITIONAL SUPPLEMENT LIQD Take by mouth. Med Pass 2.0 120 ml daily   OXYGEN Inhale 2 L into the lungs as needed. To maintain oxygen saturation greater than 90 % and shortness of breath   primidone (MYSOLINE) 50 MG tablet Take 50 mg by mouth 2 (two) times daily. For tremors   Propylene Glycol 0.6 % SOLN Apply 1 drop to eye 2 (two) times daily.    QUEtiapine (SEROQUEL) 25 MG tablet Take 25 mg by mouth every morning. For psychosis   QUEtiapine (SEROQUEL) 50 MG tablet Take 37.5 mg by mouth at bedtime. For psychosis   ranitidine (ZANTAC) 150 MG tablet Take 150 mg by mouth daily. For gerd   sennosides-docusate sodium (SENOKOT-S) 8.6-50 MG tablet Take 1 tablet by mouth at bedtime. For constipation   traMADol (ULTRAM) 50 MG tablet Take 25 mg by mouth every 12 (twelve) hours.    UNABLE TO FIND Med Name: Med Pass : Give 120 ml by mouth daily   No facility-administered encounter medications on file as of 02/26/2019.     PHYSICAL EXAM / ROS:   Current and past weights: not available today  General: NAD, frail appearing, aphasic, increased lexapro with good effect Cardiovascular: no chest pain reported, no edema,  Pulmonary: no cough, no increased SOB, Room air Abdomen: appetite very good, denies constipation, incontinent of bowel GU: denies dysuria, incontinent of urine MSK:  no joint deformities, non ambulatory, OOB with lift Skin: no rashes or wounds reported Neurological: Weakness, dementia, no change in mentation  Cyndia Skeeters DNP AGPCNP-BC

## 2019-02-27 ENCOUNTER — Non-Acute Institutional Stay: Payer: Medicare Other | Admitting: Primary Care

## 2019-03-22 ENCOUNTER — Non-Acute Institutional Stay: Payer: Medicare Other | Admitting: Primary Care

## 2019-03-22 ENCOUNTER — Telehealth: Payer: Self-pay | Admitting: Primary Care

## 2019-03-22 NOTE — Telephone Encounter (Signed)
Call to daughter, re new dx of squamous cell cancer 2 cm. She was inquiring about hospice admission and I took down her concerns. I explained that this is not a usual hospice diagnosis, and will evaluate her next week in a personal or video appointment, and return call to daughter when I have more information. She voiced agreement.

## 2019-03-22 NOTE — Telephone Encounter (Signed)
T.c from LTC to return call to daughter.

## 2019-03-25 ENCOUNTER — Non-Acute Institutional Stay: Payer: Medicare Other | Admitting: Primary Care

## 2019-03-25 ENCOUNTER — Other Ambulatory Visit: Payer: Self-pay

## 2019-03-25 DIAGNOSIS — Z515 Encounter for palliative care: Secondary | ICD-10-CM

## 2019-03-25 NOTE — Progress Notes (Signed)
[facility] Palliative Care Consult Note Telephone: [phone]  Fax: [phone]  TELEHEALTH VISIT STATEMENT Due to the COVID-19 crisis, this visit was done via telemedicine from my office. It was initiated and consented to by this patient and/or family.  PATIENT NAME: Kristin Frey [address] [place] [address] [phone] (home)  DOB: [birth year] MRN: [id number]  PRIMARY CARE PROVIDER:   Juluis Pitch, MD, [address] [place] [address] [phone]  REFERRING PROVIDER:  Juluis Pitch, MD [address] [place],  [place] [address] [phone]  RESPONSIBLE PARTY:   Extended Emergency Contact Information Primary Emergency Contact: Schmidt,Shirley Address: [place], [place] [address] Johnnette Litter of [place] Mobile Phone: [phone] Relation: Daughter   ASSESSMENT AND RECOMMENDATIONS:   1. Advance Care Planning/Goals of Care: Goals include to maximize quality of life and symptom management. T/c to POA to discuss her concern and desire for hospice admission. I reviewed the dermatology report RE the squamous cell cancer on her back, and also reviewed photos. This was dx made on bx in the nursing home. No scans available to assess any involvement outside the local area. Due to the patient's lack of physical decline/ current stability, she is not currently eligible for hospice services. Her PPS remains at 30 % and she eats 100% of all meals. We discussed possible treatments for [facility] but daughter did not want excision. I encouraged her to meet with dermatology for f/u and explore any medical management. Daughter also voiced discouragement at not being able to visit due to covid.  2. Symptom Management:   Dysphagia: Daughter  Is concerned that she is now having trouble swallowing, per staff report. Other reports that she is eating 100 %.  Recommend ST assessment to determine any ongoing swallowing  difficulty.  Skin  integrity: SCC on left back, Rx now with topicals per dermatology. Does not appear painful when treated by staff. Defer to dermatology follow up.  3. Family /Caregiver/Community Supports: Daughter is POA, states her mother is almost out of money for care. I directed her to discuss with LTC business office. She states her mother was on hospice in the past, and was d/c once she began eating.   4. Cognitive / Functional decline: At baseline, arouses, non verbal. Stable functionally, dependent in all adls.   5. Follow up Palliative Care Visit: Palliative care will continue to follow for goals of care clarification and symptom management. Return 4-6 weeks or prn.  I spent 60 minutes providing this consultation,  from 0900 to 1000. More than 50% of the time in this consultation was spent coordinating communication.   HISTORY OF PRESENT ILLNESS:  Kristin Frey is a 85 y.o. year old female with multiple medical problems including dementia, Squamous cell of the skin. Palliative Care was asked to follow this patient by consultation request of Juluis Pitch, MD to help address advance care planning and goals of care. This is a follow up visit.  CODE STATUS: DNR, MOST with limited limited interventions, limited abx and iv, no feeding tube.  PPS: 30% HOSPICE ELIGIBILITY/DIAGNOSIS: TBD  PAST MEDICAL HISTORY:  Past Medical History:  Diagnosis Date  . Atrial fibrillation ([place])   . Cancer [facility]) 1998   left breast mastectomy chemo and radiation  . Cancer [facility]) 2014   right breast wide excision   . Carotid artery disease ([place])   . Diabetes ([place])   . DVT (deep venous  thrombosis) ([place]) 1998  . Hernia   . High cholesterol   . Hypertension   . Hypothyroidism   . Parkinson disease ([place])   . Retinopathy     SOCIAL HX:  Social History   Tobacco Use  . Smoking status: Never Smoker  . Smokeless tobacco: Never Used  Substance Use Topics  . Alcohol use: No    ALLERGIES:  Allergies  Allergen  Reactions  . Penicillins Anaphylaxis  . Shellfish Allergy   . Fish Allergy Hives     PERTINENT MEDICATIONS:  Outpatient Encounter Medications as of 03/25/2019  Medication Sig  . acetaminophen (TYLENOL) 325 MG tablet Take 650 mg by mouth every 6 (six) hours as needed for fever. Temp > 99.5  . carbidopa-levodopa (SINEMET IR) 25-100 MG per tablet Take 1 tablet by mouth 3 (three) times daily. For parkinsons  . escitalopram (LEXAPRO) 10 MG tablet Take 10 mg by mouth every morning. For depression  . ipratropium-albuterol (DUONEB) 0.5-2.5 (3) MG/3ML SOLN Take 3 mLs by nebulization every 6 (six) hours as needed. For shortness of breath and wheezing  . NUTRITIONAL SUPPLEMENT LIQD Take by mouth. Med Pass 2.0 120 ml daily  . OXYGEN Inhale 2 L into the lungs as needed. To maintain oxygen saturation greater than 90 % and shortness of breath  . primidone (MYSOLINE) 50 MG tablet Take 50 mg by mouth 2 (two) times daily. For tremors  . Propylene Glycol 0.6 % SOLN Apply 1 drop to eye 2 (two) times daily.   . QUEtiapine (SEROQUEL) 25 MG tablet Take 25 mg by mouth every morning. For psychosis  . QUEtiapine (SEROQUEL) 50 MG tablet Take 37.5 mg by mouth at bedtime. For psychosis  . ranitidine (ZANTAC) 150 MG tablet Take 150 mg by mouth daily. For gerd  . sennosides-docusate sodium (SENOKOT-S) 8.6-50 MG tablet Take 1 tablet by mouth at bedtime. For constipation  . traMADol (ULTRAM) 50 MG tablet Take 25 mg by mouth every 12 (twelve) hours.   Marland Kitchen UNABLE TO FIND Med Name: Med Pass : Give 120 ml by mouth daily   No facility-administered encounter medications on file as of 03/25/2019.     PHYSICAL EXAM / ROS:   Current and past weights: stable General: NAD, frail appearing,obese Cardiovascular: no chest pain reported, no  LE edema reported Pulmonary: no cough, no increased SOB, room air Abdomen: appetite excellent, eats 100%, denies constipation, incontinent of bowel GU: denies dysuria, incontinent of urine MSK:   no joint deformities,non- ambulatory, oob with lift Skin: 3 cm round on back Left shoulder area, open, superficial,  squamous cell carcinoma per path report. Neurological: Weakness,  Awakens on verbal stimulation, not able to interact verbally, no new neuro deficits.   Cyndia Skeeters DNP AGPCNP-BC

## 2019-03-26 ENCOUNTER — Non-Acute Institutional Stay: Payer: Medicare Other | Admitting: Primary Care

## 2019-04-10 ENCOUNTER — Non-Acute Institutional Stay: Payer: Medicare Other | Admitting: Primary Care

## 2019-04-10 ENCOUNTER — Other Ambulatory Visit: Payer: Self-pay

## 2019-04-10 DIAGNOSIS — Z515 Encounter for palliative care: Secondary | ICD-10-CM

## 2019-04-10 NOTE — Progress Notes (Signed)
[facility] Palliative Care Consult Note Telephone: [phone]  Fax: [phone]  TELEHEALTH VISIT STATEMENT Due to the COVID-19 crisis, this visit was done via telemedicine from my office. It was initiated and consented to by this patient and/or family.  PATIENT NAME: Kristin Frey [address] [place] [address] [phone] (home)  DOB: 08-Feb-1930 MRN: [id number]  PRIMARY CARE PROVIDER:   Juluis Pitch, MD, [address] [place] [address] [phone]  REFERRING PROVIDER:  Juluis Pitch, MD [address] [place],  [place] [address] [phone]  RESPONSIBLE PARTY:   Extended Emergency Contact Information Primary Emergency Contact: Schmidt,Shirley Address: [place], [place] [address] Johnnette Litter of [place] Mobile Phone: [phone] Relation: Daughter   ASSESSMENT AND RECOMMENDATIONS:   1. Advance Care Planning/Goals of Care: Goals include to maximize quality of life and symptom management. Telemedicine telephone call today with the  long-term care  staff on behalf of [facility]. She has developed a squamous cell lesion that is being treated topically and her daughter has been weighing treatment options. Staff reported that she had gone to a dermatology appointment today to assess excisability of this lesion. This remains my recommendation, as a chronic squamous cell, while having the potential to metastasize, is more likely to result in increased risk of infection and increased care management requirements.   2. Symptom Management:   SKIN: Went to [facility]  today for dermatology. Procedure not able to be done.  She has begun back on  topical treatments.  UTI: Has recent uti which is now being treated.  Dementia: Subjectively, Kristin Frey continues to eat 100% of diet and maintain her weight. She is nonverbal and sleeps much of the time. She has advanced but stable dementia.   3. Family /Caregiver/Community  Supports:  Lives in LTC  With ATC care giving.   4. Cognitive / Functional decline: Baseline dementia/bedbound.  5. Follow up Palliative Care Visit: Palliative care will continue to follow for goals of care clarification and symptom management. Return 4-6 weeks or prn.  I spent 20 minutes providing this consultation,  from 1500 to 1520. More than 50% of the time in this consultation was spent coordinating communication.   HISTORY OF PRESENT ILLNESS:  Kristin Frey is a 85 y.o. year old female with multiple medical problems including dementia, squamous cell carcinoma of the skin. Palliative Care was asked to follow this patient by consultation request of Juluis Pitch, MD to help address advance care planning and goals of care. This is a follow up visit.  CODE STATUS: DNR, MOST with limited interventions, limited abx and iv, no feeding tube.  PPS: 30% HOSPICE ELIGIBILITY/DIAGNOSIS: TBD  PAST MEDICAL HISTORY:  Past Medical History:  Diagnosis Date  . Atrial fibrillation ([place])   . Cancer [facility]) 1998   left breast mastectomy chemo and radiation  . Cancer [facility]) 2014   right breast wide excision   . Carotid artery disease ([place])   . Diabetes ([place])   . DVT (deep venous thrombosis) ([place]) 1998  . Hernia   . High cholesterol   . Hypertension   . Hypothyroidism   . Parkinson disease ([place])   . Retinopathy     SOCIAL HX:  Social History   Tobacco Use  . Smoking status: Never Smoker  . Smokeless tobacco: Never Used  Substance Use Topics  . Alcohol use: No    ALLERGIES:  Allergies  Allergen Reactions  .  Penicillins Anaphylaxis  . Shellfish Allergy   . Fish Allergy Hives     PERTINENT MEDICATIONS:  Outpatient Encounter Medications as of 04/10/2019  Medication Sig  . acetaminophen (TYLENOL) 325 MG tablet Take 650 mg by mouth every 6 (six) hours as needed for fever. Temp > 99.5  . carbidopa-levodopa (SINEMET IR) 25-100 MG per tablet Take 1 tablet by mouth 3 (three) times daily.  For parkinsons  . escitalopram (LEXAPRO) 10 MG tablet Take 10 mg by mouth every morning. For depression  . ipratropium-albuterol (DUONEB) 0.5-2.5 (3) MG/3ML SOLN Take 3 mLs by nebulization every 6 (six) hours as needed. For shortness of breath and wheezing  . NUTRITIONAL SUPPLEMENT LIQD Take by mouth. Med Pass 2.0 120 ml daily  . OXYGEN Inhale 2 L into the lungs as needed. To maintain oxygen saturation greater than 90 % and shortness of breath  . primidone (MYSOLINE) 50 MG tablet Take 50 mg by mouth 2 (two) times daily. For tremors  . Propylene Glycol 0.6 % SOLN Apply 1 drop to eye 2 (two) times daily.   . QUEtiapine (SEROQUEL) 25 MG tablet Take 25 mg by mouth every morning. For psychosis  . QUEtiapine (SEROQUEL) 50 MG tablet Take 37.5 mg by mouth at bedtime. For psychosis  . ranitidine (ZANTAC) 150 MG tablet Take 150 mg by mouth daily. For gerd  . sennosides-docusate sodium (SENOKOT-S) 8.6-50 MG tablet Take 1 tablet by mouth at bedtime. For constipation  . traMADol (ULTRAM) 50 MG tablet Take 25 mg by mouth every 12 (twelve) hours.   Marland Kitchen UNABLE TO FIND Med Name: Med Pass : Give 120 ml by mouth daily   No facility-administered encounter medications on file as of 04/10/2019.     PHYSICAL EXAM / ROS:   Current and past weights: Stable General: NAD, frail by report Cardiovascular: no chest pain reported, no edema reported Pulmonary: no cough, no increased SOB Abdomen: appetite good, eats 100%,  incontinent of bowel GU: Rx for uti/ dysuria, incontinent of urine MSK:  no joint deformities, bedbound Skin: Squamous cell skin cancer on back, open and being tx with topicals. Neurological: Weakness, dementia, non verbal.  Jason Coop, NP

## 2019-05-07 ENCOUNTER — Non-Acute Institutional Stay: Payer: Medicare Other | Admitting: Primary Care

## 2019-05-10 ENCOUNTER — Non-Acute Institutional Stay: Payer: Medicare Other | Admitting: Primary Care

## 2019-05-10 ENCOUNTER — Other Ambulatory Visit: Payer: Self-pay

## 2019-05-10 DIAGNOSIS — Z515 Encounter for palliative care: Secondary | ICD-10-CM

## 2019-05-10 NOTE — Progress Notes (Signed)
[facility] Palliative Care Consult Note Telephone: [phone]  Fax: [phone]  TELEHEALTH VISIT STATEMENT Due to the COVID-19 crisis, this visit was done via telemedicine from my office. It was initiated and consented to by this patient and/or family.  PATIENT NAME: Kristin Frey [address] [place] [address] [phone] (home)  DOB: 07-Jun-1930 MRN: [id number]  PRIMARY CARE PROVIDER:   Juluis Pitch, MD, [address] [place] [address] [phone]  REFERRING PROVIDER:  Juluis Pitch, MD [address] [place],  [place] [address] [phone]  RESPONSIBLE PARTY:   Extended Emergency Contact Information Primary Emergency Contact: Schmidt,Shirley Address: [place], [place] [address] Johnnette Litter of [place] Mobile Phone: [phone] Relation: Daughter   ASSESSMENT AND RECOMMENDATIONS:   1. Advance Care Planning/Goals of Care: Goals include to maximize quality of life and symptom management. MOST in [place] per daughter who is POA.  2. Symptom Management:   Infection/ anti inflectives:   Has had 2 UTIs back to back and finished antibiotics recently. Continues to have 5FU for squamous cell carcinoma. Her MOST outlines trial or limited use of antibiotics. Revision remains my recommendation, as a chronic squamous cell, while having the potential to metastasize, is more likely to result in increased risk of infection and increased care management requirements. Remains negative for Covid infection.05/01/2019 return for excision of lesion per staff report. Recently had 5FU injection at [facility].  Nutrition: Maintaining weight with 100% intake.  3. Family /Caregiver/Community Supports:   Has daughter who is POA/ lives in [place] for past 6 years.  4. Cognitive / Functional decline: Awake, non verbal. Does not speak to staff. Bed bound  And total care.  5. Follow up Palliative Care Visit: Palliative care will  continue to follow for goals of care clarification and symptom management. Return 6 weeks or prn.  I spent 25 minutes providing this consultation,  from 1330 to 1355. More than 50% of the time in this consultation was spent coordinating communication.   HISTORY OF PRESENT ILLNESS:  Kristin Frey is a 85 y.o. year old female with multiple medical problems including squamous cell ca, parkinsons disease with lewy bodies. Palliative Care was asked to follow this patient by consultation request of Juluis Pitch, MD to help address advance care planning and goals of care. This is a follow up visit.  CODE STATUS: DNR, MOST with limited interventions, limited abx and iv, no feeding tube.  PPS: 30% HOSPICE ELIGIBILITY/DIAGNOSIS: no due to intake and weight stability.  PAST MEDICAL HISTORY:  Past Medical History:  Diagnosis Date  . Atrial fibrillation ([place])   . Cancer [facility]) 1998   left breast mastectomy chemo and radiation  . Cancer [facility]) 2014   right breast wide excision   . Carotid artery disease ([place])   . Diabetes ([place])   . DVT (deep venous thrombosis) ([place]) 1998  . Hernia   . High cholesterol   . Hypertension   . Hypothyroidism   . Parkinson disease ([place])   . Retinopathy     SOCIAL HX:  Social History   Tobacco Use  . Smoking status: Never Smoker  . Smokeless tobacco: Never Used  Substance Use Topics  . Alcohol use: No    ALLERGIES:  Allergies  Allergen Reactions  . Penicillins Anaphylaxis  . Shellfish Allergy   . Fish Allergy Hives     PERTINENT MEDICATIONS:  Outpatient Encounter Medications as of 05/10/2019  Medication Sig  . acetaminophen (TYLENOL) 325 MG tablet Take 650 mg by mouth every 6 (six) hours as needed for fever. Temp > 99.5  . carbidopa-levodopa (SINEMET IR) 25-100 MG per tablet Take 1 tablet by mouth 3 (three) times daily. For parkinsons  . escitalopram (LEXAPRO) 10 MG tablet Take 10 mg by mouth every morning. For depression  . ipratropium-albuterol  (DUONEB) 0.5-2.5 (3) MG/3ML SOLN Take 3 mLs by nebulization every 6 (six) hours as needed. For shortness of breath and wheezing  . NUTRITIONAL SUPPLEMENT LIQD Take by mouth. Med Pass 2.0 120 ml daily  . OXYGEN Inhale 2 L into the lungs as needed. To maintain oxygen saturation greater than 90 % and shortness of breath  . primidone (MYSOLINE) 50 MG tablet Take 50 mg by mouth 2 (two) times daily. For tremors  . Propylene Glycol 0.6 % SOLN Apply 1 drop to eye 2 (two) times daily.   . QUEtiapine (SEROQUEL) 25 MG tablet Take 25 mg by mouth every morning. For psychosis  . QUEtiapine (SEROQUEL) 50 MG tablet Take 37.5 mg by mouth at bedtime. For psychosis  . ranitidine (ZANTAC) 150 MG tablet Take 150 mg by mouth daily. For gerd  . sennosides-docusate sodium (SENOKOT-S) 8.6-50 MG tablet Take 1 tablet by mouth at bedtime. For constipation  . traMADol (ULTRAM) 50 MG tablet Take 25 mg by mouth every 12 (twelve) hours.   Marland Kitchen UNABLE TO FIND Med Name: Med Pass : Give 120 ml by mouth daily   No facility-administered encounter medications on file as of 05/10/2019.     PHYSICAL EXAM / ROS:  afebrile Current and past weights: 11/16 152.5 lbs, Has gained 20 lbs in 1 year. General: NAD, frail, obese Cardiovascular: no chest pain reported, no edema  Pulmonary: no cough, no increased SOB Abdomen: appetite excellent, denies constipation, incontinent of bowel GU: denies dysuria, incontinent of urine, recently Rx uti x 2. MSK:  no joint deformities, non ambulatory, bed bound Skin: no rashes or wounds reported Neurological: Weakness, lewy body dementia, Parkinson's disease  Jason Coop, NP

## 2019-06-26 ENCOUNTER — Non-Acute Institutional Stay: Payer: Medicare Other | Admitting: Primary Care

## 2019-06-26 ENCOUNTER — Other Ambulatory Visit: Payer: Self-pay

## 2019-06-26 DIAGNOSIS — Z515 Encounter for palliative care: Secondary | ICD-10-CM

## 2019-06-26 NOTE — Progress Notes (Signed)
[facility] Palliative Care Consult Note Telephone: [phone]  Fax: [phone]  TELEHEALTH VISIT STATEMENT Due to the COVID-19 crisis, this visit was done via telemedicine from my office. It was initiated and consented to by this patient and/or family.  PATIENT NAME: Kristin Frey [address] [place] [address] [phone] (home)  DOB: May 30, 1930 MRN: [id number]  PRIMARY CARE PROVIDER:   Juluis Pitch, MD, [address] [place] [address] [phone]  REFERRING PROVIDER:  Juluis Pitch, MD [address] [place],  [place] [address] [phone]  RESPONSIBLE PARTY:   Extended Emergency Contact Information Primary Emergency Contact: Schmidt,Shirley Address: [place], [place] [address] Johnnette Litter of [place] Mobile Phone: [phone] Relation: Daughter    Telephonic meeting conducted with Kristin Pucker, LPN.   ASSESSMENT AND RECOMMENDATIONS:   1. Advance Care Planning/Goals of Care: Goals include to maximize quality of life and symptom management.DNR and MOST on file. MOST with limited interventions, limited abx and iv, no feeding tube.  2. Symptom Management:   Nutrition: Has some dietary supplements  orderd now that she's started exhibiting some weight loss. Still eating 100% of meals. Will continue to follow to see if weight loss is established pattern. Has lost 5 lbs over past few months.  Pain: Does not exhibit signs of pain. Staff state she is comfortable per PAINAD scale.  Squamous cell ca: Staff states she has not had revision and continues to have 5 FU treatments on a regular basis.   3. Family /Caregiver/Community Supports: Has Daughter who is POA. Lives in [place].  4. Cognitive / Functional decline: Non verbal, PD and dementia. Dependent in all adls.  5. Follow up Palliative Care Visit: Palliative care will continue to follow for goals of care clarification and symptom management. Return 4-6 weeks or  prn.  I spent 15 minutes providing this consultation,  from 1430 to 1445. More than 50% of the time in this consultation was spent coordinating communication.   HISTORY OF PRESENT ILLNESS:  Kristin Frey is a 86 y.o. year old female with multiple medical problems including squamous cell ca of back, PD, Afib, DM. Palliative Care was asked to follow this patient by consultation request of Juluis Pitch, MD to help address advance care planning and goals of care. This is a follow up visit.  CODE STATUS: MOST with limited interventions, limited abx and iv, no feeding tube. PPS: 30% HOSPICE ELIGIBILITY/DIAGNOSIS: monitor for functional decline.  PAST MEDICAL HISTORY:  Past Medical History:  Diagnosis Date  . Atrial fibrillation ([place])   . Cancer [facility]) 1998   left breast mastectomy chemo and radiation  . Cancer [facility]) 2014   right breast wide excision   . Carotid artery disease ([place])   . Diabetes ([place])   . DVT (deep venous thrombosis) ([place]) 1998  . Hernia   . High cholesterol   . Hypertension   . Hypothyroidism   . Parkinson disease ([place])   . Retinopathy     SOCIAL HX:  Social History   Tobacco Use  . Smoking status: Never Smoker  . Smokeless tobacco: Never Used  Substance Use Topics  . Alcohol use: No    ALLERGIES:  Allergies  Allergen Reactions  . Penicillins Anaphylaxis  . Shellfish Allergy   . Fish Allergy Hives     PERTINENT MEDICATIONS:  Outpatient Encounter Medications as of 06/26/2019  Medication Sig  . acetaminophen (TYLENOL) 325 MG tablet  Take 650 mg by mouth every 6 (six) hours as needed for fever. Temp > 99.5  . carbidopa-levodopa (SINEMET IR) 25-100 MG per tablet Take 1 tablet by mouth 3 (three) times daily. For parkinsons  . escitalopram (LEXAPRO) 10 MG tablet Take 10 mg by mouth every morning. For depression  . ipratropium-albuterol (DUONEB) 0.5-2.5 (3) MG/3ML SOLN Take 3 mLs by nebulization every 6 (six) hours as needed. For shortness of breath and  wheezing  . NUTRITIONAL SUPPLEMENT LIQD Take by mouth. Med Pass 2.0 120 ml daily  . OXYGEN Inhale 2 L into the lungs as needed. To maintain oxygen saturation greater than 90 % and shortness of breath  . primidone (MYSOLINE) 50 MG tablet Take 50 mg by mouth 2 (two) times daily. For tremors  . Propylene Glycol 0.6 % SOLN Apply 1 drop to eye 2 (two) times daily.   . QUEtiapine (SEROQUEL) 25 MG tablet Take 25 mg by mouth every morning. For psychosis  . QUEtiapine (SEROQUEL) 50 MG tablet Take 37.5 mg by mouth at bedtime. For psychosis  . ranitidine (ZANTAC) 150 MG tablet Take 150 mg by mouth daily. For gerd  . sennosides-docusate sodium (SENOKOT-S) 8.6-50 MG tablet Take 1 tablet by mouth at bedtime. For constipation  . traMADol (ULTRAM) 50 MG tablet Take 25 mg by mouth every 12 (twelve) hours.   Marland Kitchen UNABLE TO FIND Med Name: Med Pass : Give 120 ml by mouth daily   No facility-administered encounter medications on file as of 06/26/2019.    PHYSICAL EXAM / ROS:   Current and past weights: fluctuations, 150.4 lbs, 156 lbs in 02/2019. Losing weight slowly.  General: NAD, frail appearing, overweight Cardiovascular: no chest pain reported, no edema Pulmonary: no cough, no increased SOB, room air Abdomen: appetite good,  denies constipation, incontinent of bowel GU: denies dysuria, incontinent of urine MSK:  Contractions of UE and LE deformities,non -ambulatory, up in chair  Skin: still Rx with 5 FU on squamous cell ca. Neurological: Weakness, non verbal, advanced dementia no pain per PAINAD scale.  Jason Coop, NP [facility]

## 2019-08-28 ENCOUNTER — Non-Acute Institutional Stay: Payer: Medicare Other | Admitting: Primary Care

## 2019-08-28 ENCOUNTER — Other Ambulatory Visit: Payer: Self-pay

## 2019-08-28 DIAGNOSIS — Z515 Encounter for palliative care: Secondary | ICD-10-CM

## 2019-08-28 NOTE — Progress Notes (Signed)
[facility] Consult Note Telephone: [phone]  Fax: [phone]    PATIENT NAME: Kristin Frey [address] [place] [address] [phone] (home)  DOB: 1930-05-13 MRN: [id number]  PRIMARY CARE PROVIDER:   Juluis Pitch, MD, [address] [place] [address] [phone]  REFERRING PROVIDER:  Juluis Pitch, MD [address] [place],  [place] [address] [phone]  RESPONSIBLE PARTY:   Extended Emergency Contact Information Primary Emergency Contact: Kristin Frey Address: [address] [place], [place] [address] [place] Mobile Phone: [phone] Relation: Daughter   ASSESSMENT AND RECOMMENDATIONS:   1. Advance Care Planning/Goals of Care: Goals include to maximize quality of life and symptom management. MOST with limited interventions, limited abx and iv, no feeding tube.  I spoke with Kristin Frey on 3/18,  the daughter of Kristin Frey. She reported concerns that her mother had not eaten for several days and could no longer swallow. Last evening after I visited,  the nursing home did some diagnostic x-rays and lab work. All were negative for pneumonia or food aspiration. We discussed the labile character of neurological disease. I let her know that people would frequently not eat for a short period of time and then resume. She voices much distress at her mother's situation. She stated she would not have wanted to live like this. Currently the only intervention offered is being fed. I discussed speech therapy with the daughter. It would be my recommendation that this would not be of benefit to her. There are no interventions that she could participate with due to her level of dementia. The family has voiced they only want to comfort care and do not want to pursue diagnostics or interventions.   2. 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 She continues to have her squamous cell cancer process on her back. She is in need of oral care 2-3 x/ daily after meals.  Dementia: Patient was crying on my visit. Nursing staff  reported that psychiatry has stopped her Seroquel,  which often results in her crying. I reported to staff that she was crying when I visited. I was able to calm her and when left alone, she stopped crying.  3. Family /Caregiver/Community Supports: POA is daughter, patient lives in [place]. 4. Cognitive / Functional decline: Can open eyes to name.  Cannot focus. FAST score 7 D.  5. Follow up Palliative Care Visit: Palliative care will continue to follow for goals of care clarification and symptom management. Return 6 weeks or prn.  I spent 25 minutes providing this consultation,  from 1200 to 1225. More than 50% of the time in this consultation was spent coordinating communication.   HISTORY OF PRESENT ILLNESS:  Kristin Frey is a 86 y.o. year old female with multiple medical problems including squamous cell ca of back, PD, Afib, DM. Palliative Care was asked to follow this patient by consultation request of Kristin Pitch, MD to help address advance care planning and goals of care. This is a follow up visit.  CODE STATUS: MOST with limited interventions, limited abx and iv, no feeding tube.  PPS: 20% HOSPICE ELIGIBILITY/DIAGNOSIS: no  PAST MEDICAL HISTORY:  Past Medical History:  Diagnosis Date  . Atrial fibrillation ([place])   . Cancer [facility]) 1998   left breast mastectomy chemo and radiation  .  Cancer [facility]) 2014   right breast wide excision   . Carotid artery disease ([place])   . Diabetes ([place])   . DVT (deep venous thrombosis) ([place]) 1998  . Hernia   . High cholesterol   . Hypertension   . Hypothyroidism   . Parkinson disease ([place])   . Retinopathy     SOCIAL HX:  Social History   Tobacco Use  . Smoking status: Never Smoker  . Smokeless tobacco: Never Used  Substance Use Topics  . Alcohol use: No     ALLERGIES:  Allergies  Allergen Reactions  . Penicillins Anaphylaxis  . Shellfish Allergy   . Fish Allergy Hives     PERTINENT MEDICATIONS:  Outpatient Encounter Medications as of 84/17/2021  Medication Sig  . acetaminophen (TYLENOL) 325 MG tablet Take 650 mg by mouth every 6 (six) hours as needed for fever. Temp > 99.5  . carbidopa-levodopa (SINEMET IR) 25-100 MG per tablet Take 1 tablet by mouth 3 (three) times daily. For parkinsons  . escitalopram (LEXAPRO) 10 MG tablet Take 10 mg by mouth every morning. For depression  . ipratropium-albuterol (DUONEB) 0.5-2.5 (3) MG/3ML SOLN Take 3 mLs by nebulization every 6 (six) hours as needed. For shortness of breath and wheezing  . NUTRITIONAL SUPPLEMENT LIQD Take by mouth. Med Pass 2.0 120 ml daily  . OXYGEN Inhale 2 L into the lungs as needed. To maintain oxygen saturation greater than 90 % and shortness of breath  . primidone (MYSOLINE) 50 MG tablet Take 50 mg by mouth 2 (two) times daily. For tremors  . Propylene Glycol 0.6 % SOLN Apply 1 drop to eye 2 (two) times daily.   . QUEtiapine (SEROQUEL) 25 MG tablet Take 25 mg by mouth every morning. For psychosis  . QUEtiapine (SEROQUEL) 50 MG tablet Take 37.5 mg by mouth at bedtime. For psychosis  . ranitidine (ZANTAC) 150 MG tablet Take 150 mg by mouth daily. For gerd  . sennosides-docusate sodium (SENOKOT-S) 8.6-50 MG tablet Take 1 tablet by mouth at bedtime. For constipation  . traMADol (ULTRAM) 50 MG tablet Take 25 mg by mouth every 12 (twelve) hours.   Marland Kitchen UNABLE TO FIND Med Name: Med Pass : Give 120 ml by mouth daily   No facility-administered encounter medications on file as of 08/28/2019.    PHYSICAL EXAM / ROS:   Current and past weights: stable  General: NAD, frail appearing, obese Cardiovascular: no chest pain reported, no edema Pulmonary: no cough, no increased SOB, room air Abdomen: appetite good, endorses constipation, incontinent of bowel GU: no signs of dysuria,  incontinent of urine MSK:  no joint deformities, non ambulatory Skin: back  wound  from squamous cell, getting chemo treatment. Neurological: Weakness, advanced dementia, non verbal  Jason Coop, NP [facility]  COVID-19 PATIENT SCREENING TOOL  Person answering questions: ____________Staff_______ _____   1.  Is the patient or any family member in the home showing any signs or symptoms regarding respiratory infection?               Person with Symptom- __________NA_________________  a. Fever  Yes___ No___          ___________________  b. Shortness of breath                                                    Yes___ No___          ___________________ c. Cough/congestion                                       Yes___  No___         ___________________ d. Body aches/pains                                                         Yes___ No___        ____________________ e. Gastrointestinal symptoms (diarrhea, nausea)           Yes___ No___        ____________________  2. Within the past 14 days, has anyone living in the home had any contact with someone with or under investigation for COVID-19?    Yes___ No_X_   Person __________________

## 2019-09-25 ENCOUNTER — Other Ambulatory Visit: Payer: Self-pay

## 2019-09-25 ENCOUNTER — Non-Acute Institutional Stay: Payer: Medicare Other | Admitting: Primary Care

## 2019-09-25 DIAGNOSIS — Z515 Encounter for palliative care: Secondary | ICD-10-CM

## 2019-09-25 NOTE — Progress Notes (Signed)
[facility] Consult Note Telephone: [phone]  Fax: [phone]    PATIENT NAME: Kristin Frey [address] [place] [address] [phone] (home)  DOB: 11-03-1929 MRN: [id number]  PRIMARY CARE PROVIDER:   Juluis Pitch, MD, [address] [place] [address] [phone]  REFERRING PROVIDER:  Juluis Pitch, MD [address] [place],  [place] [address] [phone]  RESPONSIBLE PARTY:   Extended Emergency Contact Information Primary Emergency Contact: Schmidt,Shirley Address: [place], [place] [address] [place] Mobile Phone: [phone] Relation: Daughter   I met with patient in the  facility.  ASSESSMENT AND RECOMMENDATIONS:   1. Advance Care Planning/Goals of Care: Goals include to maximize quality of life and symptom management. DNR on file .   2. Symptom Management:   Oral care: Has dark secretions draining from mouth, recommend scheduling oral care q shift or more frequently to keep this area clean and dry.  Nutrition: Reported 15 lbs wt loss in several months. Staff is to verify this is accurate and contact me tomorrow.    Crying/ Mood: Daugthter had correlated crying with going off seroquel. I recommend resuming seroquel  In a small dose. I recommend changing SSRI or increasing.  This could also be Pseudobulbar Affect and may respond to Neudexa. Please consult psychiatry/neurology about PBA.  3. Family /Caregiver/Community Supports: Daughter is POA. Lives in [place].  4. Cognitive / Functional decline: Non interactive and Awake. Cries during interview.  5. Follow up Palliative Care Visit: Palliative care will continue to follow for goals of care clarification and symptom management. Return 4-6 weeks or prn.  I spent 25 minutes providing this consultation,  from 1600 to 1625. More than 50% of the time in this consultation was spent coordinating communication.   HISTORY  OF PRESENT ILLNESS:  Kristin Frey is a 86 y.o. year old female with multiple medical problems including squamous cell ca of back, PD, Afib, DM.Marland Kitchen Palliative Care was asked to follow this patient by consultation request of Juluis Pitch, MD to help address advance care planning and goals of care. This is a follow up visit.  CODE STATUS: MOST with limited interventions, limited abx and iv, no feeding tube.   PPS: 30% HOSPICE ELIGIBILITY/DIAGNOSIS: TBD  PAST MEDICAL HISTORY:  Past Medical History:  Diagnosis Date  . Atrial fibrillation ([place])   . Cancer [facility]) 1998   left breast mastectomy chemo and radiation  . Cancer [facility]) 2014   right breast wide excision   . Carotid artery disease ([place])   . Diabetes ([place])   . DVT (deep venous thrombosis) ([place]) 1998  . Hernia   . High cholesterol   . Hypertension   . Hypothyroidism   . Parkinson disease ([place])   . Retinopathy     SOCIAL HX:  Social History   Tobacco Use  . Smoking status: Never Smoker  . Smokeless tobacco: Never Used  Substance Use Topics  . Alcohol use: No    ALLERGIES:  Allergies  Allergen Reactions  . Penicillins Anaphylaxis  . Shellfish Allergy   . Fish Allergy Hives     PERTINENT MEDICATIONS:  Outpatient Encounter Medications as of 09/25/2019  Medication Sig  . acetaminophen (TYLENOL) 325 MG tablet Take 650 mg by mouth every 6 (six) hours as needed for fever. Temp > 99.5  . carbidopa-levodopa (SINEMET IR) 25-100 MG per tablet Take 1 tablet by mouth 3 (three)  times daily. For parkinsons  . escitalopram (LEXAPRO) 10 MG tablet Take 10 mg by mouth every morning. For depression  . famotidine (PEPCID) 20 MG tablet Take 20 mg by mouth 2 (two) times daily.  Marland Kitchen levothyroxine (SYNTHROID) 50 MCG tablet Take 50 mcg by mouth daily before breakfast.  . NUTRITIONAL SUPPLEMENT LIQD Take by mouth. Med Pass 2.0 120 ml daily  . OXYGEN Inhale 2 L into the lungs as needed. To maintain oxygen saturation greater than 90 % and  shortness of breath  . Propylene Glycol 0.6 % SOLN Apply 1 drop to eye 2 (two) times daily.   . sennosides-docusate sodium (SENOKOT-S) 8.6-50 MG tablet Take 1 tablet by mouth at bedtime. For constipation  . [DISCONTINUED] ipratropium-albuterol (DUONEB) 0.5-2.5 (3) MG/3ML SOLN Take 3 mLs by nebulization every 6 (six) hours as needed. For shortness of breath and wheezing  . [DISCONTINUED] primidone (MYSOLINE) 50 MG tablet Take 50 mg by mouth 2 (two) times daily. For tremors  . [DISCONTINUED] QUEtiapine (SEROQUEL) 25 MG tablet Take 25 mg by mouth every morning. For psychosis  . [DISCONTINUED] QUEtiapine (SEROQUEL) 50 MG tablet Take 37.5 mg by mouth at bedtime. For psychosis  . [DISCONTINUED] ranitidine (ZANTAC) 150 MG tablet Take 150 mg by mouth daily. For gerd  . [DISCONTINUED] traMADol (ULTRAM) 50 MG tablet Take 25 mg by mouth every 12 (twelve) hours.   . [DISCONTINUED] UNABLE TO FIND Med Name: Med Pass : Give 120 ml by mouth daily   No facility-administered encounter medications on file as of 09/25/2019.     PHYSICAL EXAM / ROS:   Current and past weights: 153 lbs 12/20, 139 .6 lbs in 09/04/19. 15 lb loss which is 8% General: NAD, frail appearing, losing weight per staff. Repeating weight tomorrow.  Cardiovascular: no chest pain reported, no edema,  Pulmonary: no cough, no increased SOB, room air Abdomen: appetite good,, endorses constipation, incontinent of bowel, albumin on 3/17 3.1 , 10/20 3.0 albumin GU: denies dysuria, incontinent of urine MSK:  no joint deformities, non -ambulatory, bed bound Skin: no rashes or wounds reported, heel protectors in place.  Neurological: Weakness, advanced dementia, non verbal, crying frequently  Jason Coop, NP [facility]  COVID-19 PATIENT SCREENING TOOL  Person answering questions: ____________Staff_______ _____   1.  Is the patient or any family member in the home showing any signs or symptoms regarding respiratory infection?                Person with Symptom- __________NA_________________  a. Fever                                                                          Yes___ No___          ___________________  b. Shortness of breath                                                    Yes___ No___          ___________________ c. Cough/congestion  Yes___  No___         ___________________ d. Body aches/pains                                                         Yes___ No___        ____________________ e. Gastrointestinal symptoms (diarrhea, nausea)           Yes___ No___        ____________________  2. Within the past 14 days, has anyone living in the home had any contact with someone with or under investigation for COVID-19?    Yes___ No_X_   Person __________________   

## 2019-10-02 ENCOUNTER — Non-Acute Institutional Stay: Payer: Medicare Other | Admitting: Primary Care

## 2019-10-02 ENCOUNTER — Other Ambulatory Visit: Payer: Self-pay

## 2019-10-02 DIAGNOSIS — Z515 Encounter for palliative care: Secondary | ICD-10-CM

## 2019-10-02 NOTE — Progress Notes (Signed)
[facility] Consult Note Telephone: [phone]  Fax: [phone]    PATIENT NAME: Kristin Frey [address] [place] [address] [phone] (home)  DOB: [birth year] MRN: [id number]  PRIMARY CARE PROVIDER:   Juluis Pitch, MD, [address] [place] [address] [phone]  REFERRING PROVIDER:  Juluis Pitch, MD [address] [place],  [place] [address] [phone]  RESPONSIBLE PARTY:   Extended Emergency Contact Information Primary Emergency Contact: Schmidt,Kristin Frey Address: [place], [place] [address] [place] Mobile Phone: [phone] Relation: Daughter   I met with patient  in facility.  ASSESSMENT AND RECOMMENDATIONS:   1. Advance Care Planning/Goals of Care: Goals include to maximize quality of life and symptom management. DNR, comfort measures.  2. Symptom Management:  I saw Mrs. Kristin Frey today she seemed more relaxed and comfortable than a previous visit. Her  oral care was done and she opened her eyes and regarded me. Her physical exam is unremarkable.  She has had some weight loss, but today is at 141 lbs, 2 pound gain from last week. The loss  is a new trend. I recommend weekly weights to aid in assessing for hospice appropriateness. I talk to her [name] after the visit by phone. She is concerned about not being able to visit especially with her birthday coming up. I encourage her to discuss with the nursing home administration. Patient continues to be treated for skin cancer and two sites. I will continue to monitor for appropriateness to refer her to hospice.   3. Family /Caregiver/Community Supports: Daughter is Designer, industrial/product, lives in [place].  4. Cognitive / Functional decline: Alert oriented x 1 at times, dependent in all adls.  5. Follow up Palliative Care Visit: Palliative care will continue to follow for goals of care clarification and symptom management. Return 4  weeks or prn.  I spent 25 minutes providing this consultation,  from 1000 to 1025. More than 50% of the time in this consultation was spent coordinating communication.   HISTORY OF PRESENT ILLNESS:  Kristin Frey is a 86 y.o. year old female with multiple medical problems including PD, dementia, skin cancer. Palliative Care was asked to follow this patient by consultation request of Juluis Pitch, MD to help address advance care planning and goals of care. This is a follow up visit.  CODE STATUS: DNR PPS: 30% HOSPICE ELIGIBILITY/DIAGNOSIS: TBD  PAST MEDICAL HISTORY:  Past Medical History:  Diagnosis Date  . Atrial fibrillation ([place])   . Cancer [facility]) 1998   left breast mastectomy chemo and radiation  . Cancer [facility]) 2014   right breast wide excision   . Carotid artery disease ([place])   . Diabetes ([place])   . DVT (deep venous thrombosis) ([place]) 1998  . Hernia   . High cholesterol   . Hypertension   . Hypothyroidism   . Parkinson disease ([place])   . Retinopathy     SOCIAL HX:  Social History   Tobacco Use  . Smoking status: Never Smoker  . Smokeless tobacco: Never Used  Substance Use Topics  . Alcohol use: No    ALLERGIES:  Allergies  Allergen Reactions  . Penicillins Anaphylaxis  . Shellfish Allergy   . Fish Allergy Hives     PERTINENT MEDICATIONS:  Outpatient Encounter Medications as of 10/02/2019  Medication Sig  . acetaminophen (TYLENOL) 325 MG tablet Take 650 mg by mouth every 6 (six)  hours as needed for fever. Temp > 99.5  . carbidopa-levodopa (SINEMET IR) 25-100 MG per tablet Take 1 tablet by mouth 3 (three) times daily. For parkinsons  . escitalopram (LEXAPRO) 10 MG tablet Take 10 mg by mouth every morning. For depression  . famotidine (PEPCID) 20 MG tablet Take 20 mg by mouth 2 (two) times daily.  Marland Kitchen levothyroxine (SYNTHROID) 50 MCG tablet Take 50 mcg by mouth daily before breakfast.  . NUTRITIONAL SUPPLEMENT LIQD Take by mouth. Med Pass 2.0 120 ml daily  .  OXYGEN Inhale 2 L into the lungs as needed. To maintain oxygen saturation greater than 90 % and shortness of breath  . Propylene Glycol 0.6 % SOLN Apply 1 drop to eye 2 (two) times daily.   . sennosides-docusate sodium (SENOKOT-S) 8.6-50 MG tablet Take 1 tablet by mouth at bedtime. For constipation   No facility-administered encounter medications on file as of 10/02/2019.    PHYSICAL EXAM / ROS:   Current and past weights: 141 lbs, 2 lb gain from 1 week General: NAD, frail appearing, WNWD Cardiovascular: S1S2S4, no chest pain reported, no edema Pulmonary: CTA bil,  no cough, no increased SOB, oxygen at 2 L  Abdomen: appetite good, denies constipation, incontinent of bowel GU: denies dysuria, incontinent of urine MSK:  UE joint deformities, bedbound Skin: Wounds from open squamous cell ca. On back and face. Neurological: Weakness, PD with severe immobility and contractures, dementia,   Jason Coop, NP [facility]  COVID-19 PATIENT SCREENING TOOL  Person answering questions: ____________Staff_______ _____   1.  Is the patient or any family member in the home showing any signs or symptoms regarding respiratory infection?               Person with Symptom- __________NA_________________  a. Fever                                                                          Yes___ No___          ___________________  b. Shortness of breath                                                    Yes___ No___          ___________________ c. Cough/congestion                                       Yes___  No___         ___________________ d. Body aches/pains                                                         Yes___ No___        ____________________ e. Gastrointestinal symptoms (diarrhea, nausea)           Yes___ No___  ____________________  2. Within the past 14 days, has anyone living in the home had any contact with someone with or under investigation for COVID-19?    Yes___ No_X_    Person __________________

## 2019-10-21 ENCOUNTER — Non-Acute Institutional Stay: Payer: Medicare Other | Admitting: Primary Care

## 2019-10-21 ENCOUNTER — Other Ambulatory Visit: Payer: Self-pay

## 2019-10-21 DIAGNOSIS — Z515 Encounter for palliative care: Secondary | ICD-10-CM

## 2019-10-21 NOTE — Progress Notes (Signed)
[facility] Consult Note Telephone: [phone]  Fax: [phone]  PATIENT NAME: Kristin Frey [address] [place] [address] [phone] (home)  DOB: [birth year] MRN: [id number]  PRIMARY CARE PROVIDER:    Juluis Pitch, MD,  [address] [place] [address] [phone]  REFERRING PROVIDER:   Juluis Pitch, MD [address] [place],  [place] [address] [phone]  RESPONSIBLE PARTY:   Extended Emergency Contact Information Primary Emergency Contact: Kristin Frey,Kristin Frey Address: [place], [place] [address] [place] Mobile Phone: [phone] Relation: Daughter  I met with patient in the facility.  ASSESSMENT AND RECOMMENDATIONS:   1. Advance Care Planning/Goals of Care: Goals include to maximize quality of life and symptom management. [name] called to discuss mother's status. She feels she's declining, not speaking and not regarding her as she has in the past.  Staff states she was more animated after daughter left. Staff states she is not eating as voraciously as she has in the past and swallowing seems more tenuous. She is able to swallow pills however. We discussed hospice readiness, which will be better indicated once sustained weight loss is established. SNF RN states they can get a weight tomorrow and will call.  RECOMMEND weekly weights to establish hospice appropriateness. Discussed hospice assessment with SW and will keep apprised.  2. Symptom Management:   Pain: Getting scheduled tylenol  650 mg tid, displays pain on turning. Recommend tramadol 50 mg qid prn for ongoing MSK pain. Has TID sinemet 25-100 mg, recommend increase of this for stiffness.   3. Family /Caregiver/Community Supports: [name] was concerned RE decline. I will evaluate for hospice admission. She has not been able to visit in person until recently due to covid, so decline was likely more  perceivable.   4. Cognitive / Functional decline: At general baseline, non verbal, completely dependent in all alds and iadls.  5. Follow up Palliative Care Visit: Palliative care will continue to follow for goals of care clarification and symptom management. Return 2-4 weeks or prn.  I spent 25 minutes providing this consultation,  from 1300 to 1325. More than 50% of the time in this consultation was spent coordinating communication.   HISTORY OF PRESENT ILLNESS:  Kristin Frey is a 86 y.o. year old female with multiple medical problems including dementia, weight loss, PD. Palliative Care was asked to follow this patient by consultation request of Juluis Pitch, MD to help address advance care planning and goals of care. This is a follow up visit.  CODE STATUS: DNR  PPS: 30% HOSPICE ELIGIBILITY/DIAGNOSIS: TBD  PAST MEDICAL HISTORY:  Past Medical History:  Diagnosis Date  . Atrial fibrillation ([place])   . Cancer [facility]) 1998   left breast mastectomy chemo and radiation  . Cancer [facility]) 2014   right breast wide excision   . Carotid artery disease ([place])   . Diabetes ([place])   . DVT (deep venous thrombosis) ([place]) 1998  . Hernia   . High cholesterol   . Hypertension   . Hypothyroidism   . Parkinson disease ([place])   . Retinopathy     SOCIAL HX:  Social History   Tobacco Use  . Smoking status: Never Smoker  . Smokeless tobacco: Never Used  Substance Use Topics  . Alcohol use: No    ALLERGIES:  Allergies  Allergen Reactions  . Penicillins Anaphylaxis  . Shellfish Allergy   .  Fish Allergy Hives     PERTINENT MEDICATIONS:  Outpatient Encounter Medications as of 10/21/2019  Medication Sig  . acetaminophen (TYLENOL) 325 MG tablet Take 650 mg by mouth every 6 (six) hours as needed for fever. Temp > 99.5  . carbidopa-levodopa (SINEMET IR) 25-100 MG per tablet Take 1 tablet by mouth 3 (three) times daily. For parkinsons  . escitalopram (LEXAPRO) 10 MG tablet Take 10 mg by mouth  every morning. For depression  . famotidine (PEPCID) 20 MG tablet Take 20 mg by mouth 2 (two) times daily.  Marland Kitchen levothyroxine (SYNTHROID) 50 MCG tablet Take 50 mcg by mouth daily before breakfast.  . NUTRITIONAL SUPPLEMENT LIQD Take by mouth. Med Pass 2.0 120 ml daily  . OXYGEN Inhale 2 L into the lungs as needed. To maintain oxygen saturation greater than 90 % and shortness of breath  . Propylene Glycol 0.6 % SOLN Apply 1 drop to eye 2 (two) times daily.   . sennosides-docusate sodium (SENOKOT-S) 8.6-50 MG tablet Take 1 tablet by mouth at bedtime. For constipation   No facility-administered encounter medications on file as of 10/21/2019.    PHYSICAL EXAM / ROS:   Current and past weights: 139 lbs 4/15; 141 lbs 4/21; SNF will send updated tomorrow. General: NAD, frail appearing Cardiovascular: no chest pain reported, no LE edema  Pulmonary: no cough, no increased SOB, room air Abdomen: appetite lessening, denies constipation, incontinent of bowel GU: denies dysuria, incontinent of urine MSK:  no joint and ROM abnormalities, non ambulatory Skin:squamous cell ca site on back enlarging per staff report. Neurological: Weakness, advanced dementia, non verbal  Jason Coop, NP [facility]  COVID-19 PATIENT SCREENING TOOL  Person answering questions: ____________Staff_______ _____   1.  Is the patient or any family member in the home showing any signs or symptoms regarding respiratory infection?               Person with Symptom- __________NA_________________  a. Fever                                                                          Yes___ No___          ___________________  b. Shortness of breath                                                    Yes___ No___          ___________________ c. Cough/congestion                                       Yes___  No___         ___________________ d. Body aches/pains  Yes___ No___         ____________________ e. Gastrointestinal symptoms (diarrhea, nausea)           Yes___ No___        ____________________  2. Within the past 14 days, has anyone living in the home had any contact with someone with or under investigation for COVID-19?    Yes___ No_X_   Person __________________   

## 2019-10-22 ENCOUNTER — Telehealth: Payer: Self-pay | Admitting: Primary Care

## 2019-10-22 NOTE — Telephone Encounter (Signed)
Nursing home reports weight is now 144.5 lbs, up 3 more pounds in a month. She's had a 6 pound gain over 2 months time now. Will t/c daughter. Patient does not appear hospice eligible at this time.

## 2019-10-23 ENCOUNTER — Telehealth: Payer: Self-pay | Admitting: Primary Care

## 2019-10-23 NOTE — Telephone Encounter (Signed)
T/c from daughter RE hospice suggestion. Patient has now gained 3 more pounds, indicating that she is eating well and thriving. We discussed the disease process of dementia and difficulty in knowing course of disease. I will keep visiting to provide end of life guidance, assessment.

## 2019-11-28 ENCOUNTER — Other Ambulatory Visit: Payer: Self-pay

## 2019-11-28 ENCOUNTER — Non-Acute Institutional Stay: Payer: Medicare Other | Admitting: Primary Care

## 2019-11-28 DIAGNOSIS — Z515 Encounter for palliative care: Secondary | ICD-10-CM

## 2019-11-28 DIAGNOSIS — G3183 Dementia with Lewy bodies: Secondary | ICD-10-CM

## 2019-11-28 DIAGNOSIS — F0280 Dementia in other diseases classified elsewhere without behavioral disturbance: Secondary | ICD-10-CM

## 2019-11-28 NOTE — Progress Notes (Signed)
[facility] Consult Note Telephone: [phone]  Fax: [phone]  PATIENT NAME: Kristin Frey [address] [place] [address] [phone] (home)  DOB: 07-18-1929 MRN: [id number]  PRIMARY CARE PROVIDER:    Juluis Pitch, MD,  [address] [place] [address] [phone]  REFERRING PROVIDER:   Juluis Pitch, MD [address] [place],  [place] [address] [phone]  RESPONSIBLE PARTY:   Extended Emergency Contact Information Primary Emergency Contact: Kristin Frey Address: [place], [place] [address] [place] Mobile Phone: [phone] Relation: Daughter  I met with patient in the facility.  ASSESSMENT AND RECOMMENDATIONS:   1. Advance Care Planning/Goals of Care: Goals include to maximize quality of life and symptom management. Daughter continues to request hospice services. Patient has been stable with weight. I want to ascertain the stage of her squamous cell cancer; pt has appt with oncology tomorrow which should shed some more light on the prognosis. Need more information about disease process of this squamous cell ca for hospice recommendation.Daugther called, will return call tomorrow.   2. Symptom Management:   Disease process; Patient has parkinson's disease as well as growing squamous cell ca lesion on her mid upper back. This lesion is now approx 8 cm round, progressing in spite of flouracil cream. Daughter elected not to  excise with MOHS procedure due to duration of the surgery.   Nutrition: Recommend weekly weights . Honey thick liquids. ST consulted; at max potential at this time. States she has a high risk for aspiration and coughs with eating, but no recent pneumonia.Eating 75-100% of puree diet.   Pain: Has prn moabic and tramadol. She was rx for uti recently. Daughter has concerns RE her grimacing. I have noted grimace with any stimulus, including speaking.  Recommend increasing Acetaminophen to 650 mg CR q 8 hrs. Recommend increasing tramadol to 1 6 hrs prn pain. Crying was reportedly improved with seroquel but she was changed off this to depakote. Recommend revisiting medications for anxiety/behavior disturbances.   3. Family /Caregiver/Community Supports: Daughter Kristin Frey is POA. Lives in [place].  4. Cognitive / Functional decline:   5. Follow up Palliative Care Visit: Palliative care will continue to follow for goals of care clarification and symptom management. Return 1-2 weeks or prn.  I spent 35 minutes providing this consultation,  from 1130 to 1205. More than 50% of the time in this consultation was spent coordinating communication.   HISTORY OF PRESENT ILLNESS:  Kristin Frey is a 86 y.o. year old female with multiple medical problems including PD with Lewy body dementia, squamous cell cancer on back, dysphagia. Palliative Care was asked to follow this patient by consultation request of Kristin Pitch, MD to help address advance care planning and goals of care. This is a follow up visit.  CODE STATUS: DNR  PPS: 30%  HOSPICE ELIGIBILITY/DIAGNOSIS: Not evident at this time as terminal prognosis is not clear.   PAST MEDICAL HISTORY:  Past Medical History:  Diagnosis Date  . Atrial fibrillation ([place])   . Cancer [facility]) 1998   left breast mastectomy chemo and radiation  . Cancer [facility]) 2014   right breast wide excision   . Carotid artery disease ([place])   . Diabetes ([place])   . DVT (deep venous thrombosis) ([place]) 1998  . Hernia   . High cholesterol   . Hypertension   . Hypothyroidism   . Parkinson disease ([place])   .  Retinopathy     SOCIAL HX:  Social History   Tobacco Use  . Smoking status: Never Smoker  . Smokeless tobacco: Never Used  Substance Use Topics  . Alcohol use: No    ALLERGIES:  Allergies  Allergen Reactions  . Penicillins Anaphylaxis  . Shellfish Allergy   . Fish Allergy Hives     PERTINENT MEDICATIONS:    Outpatient Encounter Medications as of 11/28/2019  Medication Sig  . acetaminophen (TYLENOL) 325 MG tablet Take 650 mg by mouth every 6 (six) hours as needed for fever. Temp > 99.5  . carbidopa-levodopa (SINEMET IR) 25-100 MG per tablet Take 1 tablet by mouth 3 (three) times daily. For parkinsons  . divalproex (DEPAKOTE SPRINKLE) 125 MG capsule Take 125 mg by mouth 2 (two) times daily.  Marland Kitchen escitalopram (LEXAPRO) 10 MG tablet Take 10 mg by mouth every morning. For depression  . famotidine (PEPCID) 20 MG tablet Take 20 mg by mouth 2 (two) times daily.  . fluorouracil (EFUDEX) 5 % cream Apply 1 application topically daily.  Marland Kitchen levothyroxine (SYNTHROID) 50 MCG tablet Take 50 mcg by mouth daily before breakfast.  . NUTRITIONAL SUPPLEMENT LIQD Take by mouth. Med Pass 2.0 120 ml daily  . OXYGEN Inhale 2 L into the lungs as needed. To maintain oxygen saturation greater than 90 % and shortness of breath  . polyethylene glycol (MIRALAX / GLYCOLAX) 17 g packet Take 17 g by mouth daily.  Marland Kitchen Propylene Glycol 0.6 % SOLN Apply 1 drop to eye 2 (two) times daily.   . sennosides-docusate sodium (SENOKOT-S) 8.6-50 MG tablet Take 1 tablet by mouth at bedtime. For constipation  . traMADol (ULTRAM) 50 MG tablet Take 50 mg by mouth every 12 (twelve) hours as needed for moderate pain.   No facility-administered encounter medications on file as of 11/28/2019.    PHYSICAL EXAM / ROS:   Current and past weights: 140.1 wt, June, 2021; 139 lbs 4/15; 141 lbs 4/21. Wt a year ago= 153 lb, approx 8-10% wt loss.  General: NAD, frail appearing, non interactive Cardiovascular: no chest pain reported, no edema  Pulmonary: no cough, no increased SOB, oxygen 2 L [place] Abdomen: appetite 50-75% and better at times, endorses constipation, incontinent of bowel GU: denies dysuria, incontinent of urine MSK:  + joint and ROM abnormalities, non ambulatory, occ up in chair Skin: squamous cell lesions,  8 cm round, on back, has Right  maxillary  facial area that recurrs Neurological: Weakness, Becomes alert with daughter visit, otherwise non interactive.  Appears to be agitated with any stimulation, frowning and increasing tremors. Pronounced bil Hand contractures.  Jason Coop, NP [facility]  COVID-19 PATIENT SCREENING TOOL  Person answering questions: ____________Staff_______ _____   1.  Is the patient or any family member in the home showing any signs or symptoms regarding respiratory infection?               Person with Symptom- __________NA_________________  a. Fever                                                                          Yes___ No___          ___________________  b. Shortness of breath  Yes___ No___          ___________________ c. Cough/congestion                                       Yes___  No___         ___________________ d. Body aches/pains                                                         Yes___ No___        ____________________ e. Gastrointestinal symptoms (diarrhea, nausea)           Yes___ No___        ____________________  2. Within the past 14 days, has anyone living in the home had any contact with someone with or under investigation for COVID-19?    Yes___ No_X_   Person __________________   

## 2019-11-29 ENCOUNTER — Inpatient Hospital Stay: Payer: Federal, State, Local not specified - PPO

## 2019-11-29 ENCOUNTER — Telehealth: Payer: Self-pay | Admitting: Primary Care

## 2019-11-29 ENCOUNTER — Inpatient Hospital Stay: Payer: Federal, State, Local not specified - PPO | Admitting: Oncology

## 2019-11-29 NOTE — Telephone Encounter (Signed)
Call received from Dr. Phillip Heal, Dermatology. She gave me the information about the squamous cell cancer that helped to inform our palliative plan. Kristin Frey cancer is rarely metastatic, and she's been getting injections to control it for 4-6 months. It has continued to progress, which Dr. Phillip Heal stated was not uncommon. She now needs to progress to the next line treatment, which is an infusion. Dr. Phillip Heal had referred pt to Dr. Tasia Catchings for this infusion process. I called back mrs. Kristin Frey, daughter, to explain that this is not typical chemo as one might have with breast cancer, but a medication that needs to be given by infusion that will have  A very good chance of stopping the lesion and healing it. I explained that this treatment was actually the best palliative move b/c otherwise, the patient could possible live some more time with the lesion growing and becoming even more painful. She voiced understanding, and states she will call Dr. Elveria Rising office to pursue the appt with Dr. Tasia Catchings. I will continue to follow.

## 2019-11-29 NOTE — Telephone Encounter (Signed)
T/c From daughter [name]. She requests hospice.  We discussed what her goals were. She does not want her mother to suffer. I explained conditions of participation on hospice. We discussed her pain management. I recommend morphine 2.5 to 5 mg po q 4 hrs prn to nursing home DON.  She needs premed for procedures including bath. I have a call to Dr. Phillip Heal, dermatology, to discuss case.I have discussed extensively with hospice MD and am collecting information to establish eligibility at family request. I asked Mrs Kristin Frey to reschedule oncology appt if needed for this eligibility information. I told her I would follow up today or Monday, as soon as I had the information .

## 2019-12-03 ENCOUNTER — Inpatient Hospital Stay: Payer: Medicare Other | Attending: Oncology | Admitting: Oncology

## 2019-12-03 ENCOUNTER — Encounter: Payer: Self-pay | Admitting: Oncology

## 2019-12-03 ENCOUNTER — Other Ambulatory Visit: Payer: Self-pay

## 2019-12-03 ENCOUNTER — Inpatient Hospital Stay: Payer: Medicare Other

## 2019-12-03 VITALS — BP 104/55 | HR 64 | Temp 95.8°F

## 2019-12-03 DIAGNOSIS — E119 Type 2 diabetes mellitus without complications: Secondary | ICD-10-CM | POA: Diagnosis not present

## 2019-12-03 DIAGNOSIS — E039 Hypothyroidism, unspecified: Secondary | ICD-10-CM | POA: Insufficient documentation

## 2019-12-03 DIAGNOSIS — Z79899 Other long term (current) drug therapy: Secondary | ICD-10-CM | POA: Diagnosis not present

## 2019-12-03 DIAGNOSIS — F0280 Dementia in other diseases classified elsewhere without behavioral disturbance: Secondary | ICD-10-CM | POA: Diagnosis not present

## 2019-12-03 DIAGNOSIS — C4492 Squamous cell carcinoma of skin, unspecified: Secondary | ICD-10-CM | POA: Diagnosis present

## 2019-12-03 DIAGNOSIS — Z7189 Other specified counseling: Secondary | ICD-10-CM | POA: Diagnosis not present

## 2019-12-03 DIAGNOSIS — G3183 Dementia with Lewy bodies: Secondary | ICD-10-CM | POA: Diagnosis not present

## 2019-12-03 DIAGNOSIS — E7800 Pure hypercholesterolemia, unspecified: Secondary | ICD-10-CM | POA: Diagnosis not present

## 2019-12-03 DIAGNOSIS — I4891 Unspecified atrial fibrillation: Secondary | ICD-10-CM | POA: Insufficient documentation

## 2019-12-03 DIAGNOSIS — I10 Essential (primary) hypertension: Secondary | ICD-10-CM | POA: Diagnosis not present

## 2019-12-03 DIAGNOSIS — F0391 Unspecified dementia with behavioral disturbance: Secondary | ICD-10-CM | POA: Diagnosis not present

## 2019-12-03 DIAGNOSIS — F0281 Dementia in other diseases classified elsewhere with behavioral disturbance: Secondary | ICD-10-CM

## 2019-12-03 NOTE — Progress Notes (Signed)
Patient here for new patient evaluation for squamous cell carcinoma and is accompanied by her daughter as well as care taker from [employer].  Does not use a local pharmacy, the meds are dispensed at [facility].

## 2019-12-03 NOTE — Progress Notes (Signed)
Hematology/Oncology Consult note [facility] Telephone:([phone] [phone]   Patient Care Team: Juluis Pitch, MD as PCP - General (Family Medicine) Bary Castilla, [place] Gleason, MD (General Surgery) Jason Coop, NP as Nurse Practitioner (Hospice and Palliative Medicine)  REFERRING PROVIDER: Jannet Mantis, *  CHIEF COMPLAINTS/REASON FOR VISIT:  Evaluation of squamous cell carcinoma of the skin  HISTORY OF PRESENTING ILLNESS:   Kristin Frey is a  86 y.o.  female with PMH listed below was seen in consultation at the request of  Benitez-Graham, Ana M, *  for evaluation of locally advanced squamous cell carcinoma of the skin. Patient follows up with Dr. Phillip Heal dermatology for treatments of squamous cell carcinoma of the skin. She has squamous cell carcinoma lesions on the right side of her neck as well as much more advanced squamous cell carcinoma lesion on her back.  Patient previously got 5-FU injections with no significant improvement.  Patient's lesion on the back have increased in size. Patient was referred to cancer center for discussion of systemic therapy. She has advanced dementia and is nonverbal.  Power of attorney daughter accompanied patient to today's clinic. Patient currently resides in facility. Patient is not able to provide any history.  Medical history was obtained from her daughter. Per daughter, and the staff from nursing facility, patient does not have eye contact, cannot feed or dress herself.  She does not verbalize her feeling or needs.  She eats pured food being fed to her.   Review of Systems  Unable to perform ROS: Dementia    MEDICAL HISTORY:  Past Medical History:  Diagnosis Date  . Atrial fibrillation ([place])   . Cancer [facility]) 1998   left breast mastectomy chemo and radiation  . Cancer [facility]) 2014   right breast wide excision   . Carotid artery disease ([place])   . Dementia ([place])   . Diabetes ([place])   .  DVT (deep venous thrombosis) ([place]) 1998  . Hernia   . High cholesterol   . Hypertension   . Hypothyroidism   . Parkinson disease ([place])   . Retinopathy     SURGICAL HISTORY: Past Surgical History:  Procedure Laterality Date  . APPENDECTOMY  1949  . BREAST SURGERY Left 1998   mastectomy  . BREAST SURGERY Right 2014   wide excision, sentinel node bx, mastoplasty,MammoSite  . CARPAL TUNNEL RELEASE Bilateral 1976  . CHOLECYSTECTOMY  2007  . EYE SURGERY  1985-1986    SOCIAL HISTORY: Social History   Socioeconomic History  . Marital status: Married    Spouse name: Not on file  . Number of children: 3  . Years of education: 82  . Highest education level: Not on file  Occupational History  . Occupation: N/A  Tobacco Use  . Smoking status: Never Smoker  . Smokeless tobacco: Never Used  Substance and Sexual Activity  . Alcohol use: No  . Drug use: No  . Sexual activity: Never  Other Topics Concern  . Not on file  Social History Narrative   Patient lives at home alone.   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 Brother developed Parkinson's use to "chew coal" as a child.)   Caffeine Use: 2-3 cups caffeine daily   Social Determinants of Health   Financial Resource Strain:   . Difficulty of Paying Living Expenses:   Food Insecurity:   . Worried About Charity fundraiser in the Last Year:   .  Ran Out of Food in the Last Year:   Transportation Needs:   . Film/video editor (Medical):   Marland Kitchen Lack of Transportation (Non-Medical):   Physical Activity:   . Days of Exercise per Week:   . Minutes of Exercise per Session:   Stress:   . Feeling of Stress :   Social Connections:   . Frequency of Communication with Friends and Family:   . Frequency of Social Gatherings with Friends and Family:   . Attends Religious Services:   . Active Member of Clubs or Organizations:   . Attends Archivist Meetings:   Marland Kitchen Marital  Status:   Intimate Partner Violence:   . Fear of Current or Ex-Partner:   . Emotionally Abused:   Marland Kitchen Physically Abused:   . Sexually Abused:     FAMILY HISTORY: Family History  Problem Relation Age of Onset  . Heart attack Mother   . Heart disease Mother   . Heart disease Brother   . Parkinsonism Brother     ALLERGIES:  is allergic to penicillins, shellfish allergy, and fish allergy.  MEDICATIONS:  Current Outpatient Medications  Medication Sig Dispense Refill  . acetaminophen (TYLENOL) 325 MG tablet Take 650 mg by mouth every 6 (six) hours as needed for fever. Temp > 99.5    . carbidopa-levodopa (SINEMET IR) 25-100 MG per tablet Take 1 tablet by mouth 3 (three) times daily. For parkinsons    . divalproex (DEPAKOTE SPRINKLE) 125 MG capsule Take 125 mg by mouth 2 (two) times daily.    Marland Kitchen escitalopram (LEXAPRO) 10 MG tablet Take 10 mg by mouth every morning. For depression    . fluorouracil (EFUDEX) 5 % cream Apply 1 application topically daily.    Marland Kitchen levothyroxine (SYNTHROID) 50 MCG tablet Take 50 mcg by mouth daily before breakfast.    . meloxicam (MOBIC) 7.5 MG tablet Take 7.5 mg by mouth daily as needed for pain.    Marland Kitchen NUTRITIONAL SUPPLEMENT LIQD Take by mouth. Med Pass 2.0 120 ml daily    . OXYGEN Inhale 2 L into the lungs as needed. To maintain oxygen saturation greater than 90 % and shortness of breath    . polyethylene glycol (MIRALAX / GLYCOLAX) 17 g packet Take 17 g by mouth daily.    Marland Kitchen Propylene Glycol 0.6 % SOLN Apply 1 drop to eye 2 (two) times daily.     . sennosides-docusate sodium (SENOKOT-S) 8.6-50 MG tablet Take 1 tablet by mouth at bedtime. For constipation    . traMADol (ULTRAM) 50 MG tablet Take 50 mg by mouth every 12 (twelve) hours as needed for moderate pain.    . famotidine (PEPCID) 20 MG tablet Take 20 mg by mouth 2 (two) times daily. (Patient not taking: Reported on 12/03/2019)     No current facility-administered medications for this visit.     PHYSICAL  EXAMINATION: ECOG PERFORMANCE STATUS: 4 - Bedbound Vitals:   12/03/19 1119  BP: (!) 104/55  Pulse: 64  Temp: (!) 95.8 F (35.4 C)   There were no vitals filed for this visit.  Physical Exam Constitutional:      General: She is not in acute distress. HENT:     Head: Normocephalic and atraumatic.  Eyes:     General: No scleral icterus. Cardiovascular:     Rate and Rhythm: Normal rate.     Pulses: Normal pulses.  Pulmonary:     Effort: No respiratory distress.  Abdominal:     Palpations: Abdomen is  soft.  Musculoskeletal:        General: No swelling.     Cervical back: Neck supple.  Skin:    General: Skin is warm.     Comments: Right lateral neck skin lesion with scarring tissue. About 7 cm deep ulcer on patient's right scapular back, lateral to T4, with yellowish/purulent discharges,  covered with dressing.,    Neurological:     Mental Status: Mental status is at baseline.     Comments: Nonverbal     LABORATORY DATA:  I have reviewed the data as listed Lab Results  Component Value Date   WBC 11.6 09/27/2016   HGB 10.8 (A) 09/27/2016   HCT 34 (A) 09/27/2016   MCV 93 11/29/2013   PLT 324 09/27/2016   No results for input(s): NA, K, CL, CO2, GLUCOSE, BUN, CREATININE, CALCIUM, GFRNONAA, GFRAA, PROT, ALBUMIN, AST, ALT, ALKPHOS, BILITOT, BILIDIR, IBILI in the last 8760 hours. Iron/TIBC/Ferritin/ %Sat No results found for: IRON, TIBC, FERRITIN, IRONPCTSAT    RADIOGRAPHIC STUDIES: I have personally reviewed the radiological images as listed and agreed with the findings in the report. No results found.    ASSESSMENT & PLAN:  1. Squamous cell carcinoma of skin    #I had a lengthy discussion with patient's daughter. Clinically patient has locally advanced squamous cell carcinoma of the skin, of her scapular back. Systemic immunotherapy with cemiplimab Orlene Erm) is approved for treatment of patient with advanced cutaneous SCC were not candidates for curative surgery  or radiation.  I discussed with patient dermatologist Dr. Phillip Heal.  Patient's disease has been refractory to local therapy.  Dr. Phillip Heal does not think patient has metastatic disease yet. Data from phase 1 expansion cohort an open label phase 2 trial showed objective response rate ranging from 40 to 50%. I discussed the mechanism of action and rationale of using immunotherapy Cemiplimab.  In general, immunotherapy is less toxic comparing to other systemic treatment. The goal of therapy is palliative; and length of treatments are likely ongoing/based upon the results of the scans. Discussed the potential side effects of immunotherapy including but not limited to diarrhea; skin rash; respiratory failure, kidney failure, mental status change, elevated LFTs/liver failure,endocrine abnormalities, acute deterioration  and even death,etc.  Patient's daughter expresses her concerns of putting patient through immunotherapy which may cause side effects.  I discussed with her that immunotherapy in general is well-tolerated however potential side effects may happen.  Daughter is concerned about patient's poor performance status, advanced dementia, already poor life quality, she is not willing to proceed patient through any process that may potentially further worse her quality.  Patient was on hospice previously.  Patient is not currently enrolled in hospice.  She has palliative care service.Pain control, currently patient is on Tylenol and tramadol.  It has been difficult for titrating given that patient is not able to verbalize her pain level. I will further discuss with our palliative care service to see if there is any other support that we can provide to patient.  Daughter appreciates that. All questions were asked to patient's daughter's satisfaction.  No follow-up needed.  I called Dr. Phillip Heal and updated her.  No orders of the defined types were placed in this encounter.   All questions were answered. The  patient knows to call the clinic with any problems questions or concerns.  cc Jannet Mantis, *    Thank you for this kind referral and the opportunity to participate in the care of this patient. A copy  of today's note is routed to referring provider    Earlie Server, MD, PhD Hematology Oncology [facility] at [facility] Pager- [phone] 12/03/2019

## 2019-12-06 ENCOUNTER — Non-Acute Institutional Stay: Payer: Medicare Other | Admitting: Primary Care

## 2019-12-06 ENCOUNTER — Other Ambulatory Visit: Payer: Self-pay

## 2019-12-06 DIAGNOSIS — Z515 Encounter for palliative care: Secondary | ICD-10-CM

## 2019-12-06 DIAGNOSIS — G3183 Dementia with Lewy bodies: Secondary | ICD-10-CM

## 2019-12-06 DIAGNOSIS — F0280 Dementia in other diseases classified elsewhere without behavioral disturbance: Secondary | ICD-10-CM

## 2019-12-06 NOTE — Progress Notes (Signed)
[facility] Consult Note Telephone: [phone]  Fax: [phone]  PATIENT NAME: Kristin Frey [address] [place] [address] [phone] (home)  DOB: 08/09/1929 MRN: [id number]  PRIMARY CARE PROVIDER:    Juluis Pitch, MD,  [address] [place] [address] [phone]  REFERRING PROVIDER:   Juluis Pitch, MD [address] [place],  [place] [address] [phone]  RESPONSIBLE PARTY:   Extended Emergency Contact Information Primary Emergency Contact: Schmidt,Shirley Address: [address], [place] [address] [place] Mobile Phone: [phone] Relation: Daughter  I met with patient in the facility.  ASSESSMENT AND RECOMMENDATIONS:   1. Advance Care Planning/Goals of Care: Goals include to maximize quality of life and symptom management. I spoke with Kristin Frey by phone. Our goals of care conversation included a discussion about:      Experiences with loved ones who have been seriously ill or have died   Exploration of personal, cultural or spiritual beliefs that might influence medical decisions    We continue to discuss hospice desire but currently patient does not have a hospice diagnosis or prognosis with current data points. I have consulted with hospice MD as well RE hospice eligibility.  Kristin Frey decided not to pursue the immunology Rx due to potential side effects and low success rate (40% in a healthy person). She continues to have comfort measures as a goal. We discussed Kristin. Frey's continued good intake and weight stability as indication she may not be approaching end of life. I asked Kristin Frey if she would treat infections such as wound or UTI, at this point or if she thought such treatment was life extending. She said she thought it would be life extending and would not want to treat infections.  I have consulted with the oncologist who saw her this past week. We  again discussed the expected course of squamous cell cancer, that it is not metastatic in the sense of other cancers. Daughter  feels a doctor told her it was metastatic, but I will continue to clarify this cancer rarely metastasizes  before several years.I asked her to again clarify goals for her mother. She states she wants her comfortable and out of pain. She states she does not think her mother can see anymore. She is also concerned about the large amount of drainage on her gowns from the wound.  2. Symptom Management:   Pain: Pain control is not well addressed currently with prn only in the non verbal patient. I recommend to start a more comfort-centric approach with scheduled morphine sulfate 5 mg po every 8 hours with option for 5 mg morphine po q 4 hrs prn.   Wound: Family states drainage is distressing. Choice to not treat will mean this wound will continue to expand and need palliative wound and drainage management. Please start using foam dressing with alginate layer as well to control the drainage.  Nutrition: Continues to eat largely 75-100% puree diet. ST has stated at aspiration risk but continues to swallow satisfactorily to consume most of her meals. Should her intake decline and she has weight loss we can again assess hospice referral.   3. Family /Caregiver/Community Supports: Daughter is Designer, industrial/product, lives in [place] .  4. Cognitive / Functional decline: Min. Alert, responsd to stimulation. Dependent in all adls.   5. Follow up Palliative Care Visit: Palliative care will continue to follow for goals of care  clarification and symptom management. Return 2 weeks or prn.  I spent 80 minutes providing this consultation,  from 1500 to 1620. More than 50% of the time in this consultation was spent coordinating communication.   HISTORY OF PRESENT ILLNESS:  Kristin Frey is a 86 y.o. year old female with multiple medical problems including chronic pain, exudating wound from advancing  squamous cell  carcinoma. Palliative Care was asked to follow this patient by consultation request of Juluis Pitch, MD to help address advance care planning and goals of care. This is a follow up visit.   CODE STATUS: DNR  PPS: 30%  HOSPICE ELIGIBILITY/DIAGNOSIS: needs hospice diagnosis  PAST MEDICAL HISTORY:  Past Medical History:  Diagnosis Date  . Atrial fibrillation ([place])   . Cancer [facility]) 1998   left breast mastectomy chemo and radiation  . Cancer [facility]) 2014   right breast wide excision   . Carotid artery disease ([place])   . Dementia ([place])   . Diabetes ([place])   . DVT (deep venous thrombosis) ([place]) 1998  . Hernia   . High cholesterol   . Hypertension   . Hypothyroidism   . Parkinson disease ([place])   . Retinopathy     SOCIAL HX:  Social History   Tobacco Use  . Smoking status: Never Smoker  . Smokeless tobacco: Never Used  Substance Use Topics  . Alcohol use: No    ALLERGIES:  Allergies  Allergen Reactions  . Penicillins Anaphylaxis  . Shellfish Allergy   . Fish Allergy Hives     PERTINENT MEDICATIONS:  Outpatient Encounter Medications as of 12/06/2019  Medication Sig  . acetaminophen (TYLENOL) 325 MG tablet Take 650 mg by mouth every 6 (six) hours as needed for fever. Temp > 99.5  . carbidopa-levodopa (SINEMET IR) 25-100 MG per tablet Take 1 tablet by mouth 3 (three) times daily. For parkinsons  . divalproex (DEPAKOTE SPRINKLE) 125 MG capsule Take 125 mg by mouth 2 (two) times daily.  Marland Kitchen escitalopram (LEXAPRO) 10 MG tablet Take 10 mg by mouth every morning. For depression  . famotidine (PEPCID) 20 MG tablet Take 20 mg by mouth 2 (two) times daily.   Marland Kitchen levothyroxine (SYNTHROID) 50 MCG tablet Take 50 mcg by mouth daily before breakfast.  . meloxicam (MOBIC) 7.5 MG tablet Take 7.5 mg by mouth daily as needed for pain.  Marland Kitchen NUTRITIONAL SUPPLEMENT LIQD Take by mouth. Med Pass 2.0 120 ml daily  . OXYGEN Inhale 2 L into the lungs as needed. To maintain oxygen saturation greater than  90 % and shortness of breath  . polyethylene glycol (MIRALAX / GLYCOLAX) 17 g packet Take 17 g by mouth daily.  Marland Kitchen Propylene Glycol 0.6 % SOLN Apply 1 drop to eye 2 (two) times daily.   . sennosides-docusate sodium (SENOKOT-S) 8.6-50 MG tablet Take 1 tablet by mouth at bedtime. For constipation  . traMADol (ULTRAM) 50 MG tablet Take 50 mg by mouth every 12 (twelve) hours as needed for moderate pain.  . [DISCONTINUED] fluorouracil (EFUDEX) 5 % cream Apply 1 application topically daily.   No facility-administered encounter medications on file as of 12/06/2019.    PHYSICAL EXAM / ROS:   Current and past weights: Current weight 140 lbs, 4 months ago wt was 144 lbs. General: NAD, frail appearing Cardiovascular: no chest pain reported, no edema  Pulmonary: no cough, no increased SOB, oxygen 2 L  Abdomen: appetite good , staff denies constipation, incontinent of bowel GU: denies dysuria, incontinent of urine MSK:  multiple  joint and ROM abnormalities, non ambulatory Skin: large lesion in T4 area of back Neurological: Weakness, non verbal, PAINAD 6/10  Jason Coop, NP [facility]  COVID-19 PATIENT SCREENING TOOL  Person answering questions: ____________Staff_______ _____   1.  Is the patient or any family member in the home showing any signs or symptoms regarding respiratory infection?               Person with Symptom- __________NA_________________  a. Fever                                                                          Yes___ No___          ___________________  b. Shortness of breath                                                    Yes___ No___          ___________________ c. Cough/congestion                                       Yes___  No___         ___________________ d. Body aches/pains                                                         Yes___ No___        ____________________ e. Gastrointestinal symptoms (diarrhea, nausea)           Yes___ No___         ____________________  2. Within the past 14 days, has anyone living in the home had any contact with someone with or under investigation for COVID-19?    Yes___ No_X_   Person __________________

## 2020-01-01 ENCOUNTER — Non-Acute Institutional Stay: Payer: Medicare Other | Admitting: Primary Care

## 2020-01-01 ENCOUNTER — Other Ambulatory Visit: Payer: Self-pay

## 2020-01-01 DIAGNOSIS — Z515 Encounter for palliative care: Secondary | ICD-10-CM

## 2020-01-01 DIAGNOSIS — F0280 Dementia in other diseases classified elsewhere without behavioral disturbance: Secondary | ICD-10-CM

## 2020-01-01 NOTE — Progress Notes (Signed)
[facility] Consult Note Telephone: [phone]  Fax: [phone]  PATIENT NAME: Kristin Frey [address] [place] [address] [phone] (home)  DOB: 1929-11-29 MRN: [id number]  PRIMARY CARE PROVIDER:    Juluis Pitch, MD,  [address] [place] [address] [phone]  REFERRING PROVIDER:   Juluis Pitch, MD [address] [place],  [place] [address] [phone]  RESPONSIBLE PARTY:   Extended Emergency Contact Information Primary Emergency Contact: Kristin Frey Address: [place], [place] [address] [place] Mobile Phone: [phone] Relation: Daughter  I met face to face with patient in facility.  ASSESSMENT AND RECOMMENDATIONS:   1. Advance Care Planning/Goals of Care: Goals include to maximize quality of life and symptom management. Patient advanced directives are on file with DNR, limited measures, limited abx, iv fluids, no feeding tube.   2. Symptom Management:   Pain: Pain appears improved from introducing scheduled narcotic. Patient appears relaxed and comfortable. Staff endorses better pain relief as evidenced by less grimacing and posturing.  Squamous Cell CA: Staff reports continues to increase in size. This is a non metastatic disease but will extend over time. Recommend dressing which will control exudate.    3. Family /Caregiver/Community Supports: Daughter is Designer, industrial/product, lives in [place].  4. Cognitive / Functional decline: A and O, opens eyes to voice. Dependent in all adls and iadls.  5. Follow up Palliative Care Visit: Palliative care will continue to follow for goals of care clarification and symptom management. Return 4-6 weeks or prn.  I spent 25 minutes providing this consultation,  from 1000 to 1025. More than 50% of the time in this consultation was spent coordinating communication.   CHIEF COMPLAINT: Pain, worsening squamous cell lesions.  HISTORY OF  PRESENT ILLNESS:  Kristin Frey is a 86 y.o. year old female with multiple medical problems including PD, dementia, immobility, contractures, squamous cell ca of the back. Palliative Care was asked to follow this patient by consultation request of Juluis Pitch, MD to help address advance care planning and goals of care. This is a follow up visit.  CODE STATUS: DNR , MOST with limited measures, limited abx, iv fluids, no feeding tube.   PPS: 30%  HOSPICE ELIGIBILITY/DIAGNOSIS: no  PAST MEDICAL HISTORY:  Past Medical History:  Diagnosis Date  . Atrial fibrillation ([facility])   . Cancer [facility]) 1998   left breast mastectomy chemo and radiation  . Cancer [facility]) 2014   right breast wide excision   . Carotid artery disease ([facility])   . Dementia ([place])   . Diabetes ([place])   . DVT (deep venous thrombosis) ([facility]) 1998  . Hernia   . High cholesterol   . Hypertension   . Hypothyroidism   . Parkinson disease ([place])   . Retinopathy     SOCIAL HX:  Social History   Tobacco Use  . Smoking status: Never Smoker  . Smokeless tobacco: Never Used  Substance Use Topics  . Alcohol use: No   FAMILY HX:  Family History  Problem Relation Age of Onset  . Heart attack Mother   . Heart disease Mother   . Heart disease Brother   . Parkinsonism Brother     ALLERGIES:  Allergies  Allergen Reactions  . Penicillins Anaphylaxis  . Shellfish Allergy   . Fish Allergy Hives     PERTINENT MEDICATIONS:  Outpatient Encounter Medications as of 01/01/2020  Medication Sig  .  acetaminophen (TYLENOL) 325 MG tablet Take 650 mg by mouth every 6 (six) hours as needed for fever. Temp > 99.5  . carbidopa-levodopa (SINEMET IR) 25-100 MG per tablet Take 1 tablet by mouth 3 (three) times daily. For parkinsons  . divalproex (DEPAKOTE SPRINKLE) 125 MG capsule Take 125 mg by mouth 2 (two) times daily.  Marland Kitchen escitalopram (LEXAPRO) 10 MG tablet Take 10 mg by mouth every morning. For depression  . famotidine (PEPCID) 20 MG tablet  Take 20 mg by mouth 2 (two) times daily.   Marland Kitchen levothyroxine (SYNTHROID) 50 MCG tablet Take 50 mcg by mouth daily before breakfast.  . meloxicam (MOBIC) 7.5 MG tablet Take 7.5 mg by mouth daily as needed for pain.  Marland Kitchen NUTRITIONAL SUPPLEMENT LIQD Take by mouth. Med Pass 2.0 120 ml daily  . OXYGEN Inhale 2 L into the lungs as needed. To maintain oxygen saturation greater than 90 % and shortness of breath  . polyethylene glycol (MIRALAX / GLYCOLAX) 17 g packet Take 17 g by mouth daily.  Marland Kitchen Propylene Glycol 0.6 % SOLN Apply 1 drop to eye 2 (two) times daily.   . sennosides-docusate sodium (SENOKOT-S) 8.6-50 MG tablet Take 1 tablet by mouth at bedtime. For constipation  . traMADol (ULTRAM) 50 MG tablet Take 50 mg by mouth every 12 (twelve) hours as needed for moderate pain.   No facility-administered encounter medications on file as of 01/01/2020.    PHYSICAL EXAM / ROS:   Current and past weights: Stable General: NAD, frail appearing, WNWD Cardiovascular: no chest pain reported, sl pedal edema  Pulmonary: no cough, no increased SOB, room air Abdomen: must be fed, eats 100 % most meals, appetite fair, denies constipation, incontinent of bowel GU: denies dysuria, incontinent of urine MSK:  ++ joint and ROM abnormalities, UA contractures, non-ambulatory, bed bound Skin: increasing skin lesion on upper back Neurological: Weakness, dementia, non verbal, pain improved on pain ad 1/10  Jason Coop, NP , DNP, MPH, [facility]  COVID-19 PATIENT SCREENING TOOL  Person answering questions: ____________Staff_______ _____   1.  Is the patient or any family member in the home showing any signs or symptoms regarding respiratory infection?               Person with Symptom- __________NA_________________  a. Fever                                                                          Yes___ No___          ___________________  b. Shortness of breath                                                     Yes___ No___          ___________________ c. Cough/congestion                                       Yes___  No___         ___________________  d. Body aches/pains                                                         Yes___ No___        ____________________ e. Gastrointestinal symptoms (diarrhea, nausea)           Yes___ No___        ____________________  2. Within the past 14 days, has anyone living in the home had any contact with someone with or under investigation for COVID-19?    Yes___ No_X_   Person __________________

## 2020-01-08 ENCOUNTER — Other Ambulatory Visit: Payer: Self-pay

## 2020-01-08 ENCOUNTER — Non-Acute Institutional Stay: Payer: Medicare Other | Admitting: Primary Care

## 2020-01-08 DIAGNOSIS — F0280 Dementia in other diseases classified elsewhere without behavioral disturbance: Secondary | ICD-10-CM

## 2020-01-08 DIAGNOSIS — G3183 Dementia with Lewy bodies: Secondary | ICD-10-CM

## 2020-01-08 DIAGNOSIS — Z515 Encounter for palliative care: Secondary | ICD-10-CM

## 2020-01-08 NOTE — Progress Notes (Signed)
  [facility] Consult Note Telephone: [phone]  Fax: [phone]  PATIENT NAME: Kristin Frey [address] [place] [address] [phone] (home)  DOB: 05/02/1930 MRN: [id number]  PRIMARY CARE PROVIDER:    Bronstein, David, MD,  [address] [place] [address] [phone]  REFERRING PROVIDER:   Bronstein, David, MD [address] [place],  [place] [address] [phone]  RESPONSIBLE PARTY:   Extended Emergency Contact Information Primary Emergency Contact: Schmidt,Shirley Address: [address]          [place], [place] [address] [place] Mobile Phone: [phone] Relation: Daughter  I met face to face with patient in facility.  ASSESSMENT AND RECOMMENDATIONS:   1. Advance Care Planning/Goals of Care: Goals include to maximize quality of life and symptom management. Comfort measures on file. Daughter and POA embraces comfort measures and care. Has declined to treat squamous cell cancer, opting for supportive care. Discussed hospice eligibility with hospice medical director giving assessment of qualifying signs and symptoms. T/c to Daughter who had called yesterday to discuss pain management. She is very concerned pt is not getting good pain control. Called x 2 and no ability to leave a message. Will attempt call later to discuss.  2. Symptom Management:   Nutrition; Eating less per staff and per record. Had been at 100 % consistently, now is eating some full meals, but < 25% at others. Weight is 138 lb, down from 155 lb a year ago.  Pain: Tramadol appears to be helping at this observation. Currently ordered at 50 mg bid and 50 mg bid prn.She appears comfortable after dosing but daughter reports there are times she feels she is in pain. Pt is non verbal.  Recommend scheduling tramadol 50 mg q6hrs for consistent pain control in the patient with chronic pain. Alternately, morphine 5 mg qid  would be appropriate  for pain management.  3. Follow up Palliative Care Visit: Palliative care will continue to follow for goals of care clarification and symptom management. Return 4 weeks or prn if not admitted to hospice.  4. Family /Caregiver/Community Supports: Daughter is POA. Lives in LTC.  5. Cognitive / Functional decline: Lethargic, non verbal, immobile due to PD. Unable to do any adls or iadls.   I spent 35 minutes providing this consultation,  from 1200 to 1235. More than 50% of the time in this consultation was spent coordinating communication.   CHIEF COMPLAINT: continued uncontrolled pain  HISTORY OF PRESENT ILLNESS:  Kristin Frey is a 86 y.o. year old female with multiple medical problems including PD, dementia, contractures, open wound, chronic pain. Palliative Care was asked to follow this patient by consultation request of Bronstein, David, MD to help address advance care planning and goals of care. This is a follow up visit.  CODE STATUS: DNR, comfort measures  PPS: 30% weak  HOSPICE ELIGIBILITY/DIAGNOSIS: yes/parkinsons disease  PAST MEDICAL HISTORY:  Past Medical History:  Diagnosis Date  . Atrial fibrillation ([facility])   . Cancer ([facility]) 1998   left breast mastectomy chemo and radiation  . Cancer ([facility]) 2014   right breast wide excision   . Carotid artery disease ([facility])   . Dementia ([facility])   . Diabetes ([facility])   . DVT (deep venous thrombosis) ([facility]) 1998  . Hernia   . High cholesterol   . Hypertension   . Hypothyroidism   . Parkinson disease ([facility])   . Retinopathy     SOCIAL HX:  Social   History   Tobacco Use  . Smoking status: Never Smoker  . Smokeless tobacco: Never Used  Substance Use Topics  . Alcohol use: No   FAMILY HX:  Family History  Problem Relation Age of Onset  . Heart attack Mother   . Heart disease Mother   . Heart disease Brother   . Parkinsonism Brother     ALLERGIES:  Allergies  Allergen Reactions  . Penicillins Anaphylaxis  . Shellfish Allergy   .  Fish Allergy Hives     PERTINENT MEDICATIONS:  Outpatient Encounter Medications as of 01/08/2020  Medication Sig  . acetaminophen (TYLENOL) 325 MG tablet Take 650 mg by mouth every 6 (six) hours as needed for fever. Temp > 99.5  . carbidopa-levodopa (SINEMET IR) 25-100 MG per tablet Take 1 tablet by mouth 3 (three) times daily. For parkinsons  . divalproex (DEPAKOTE SPRINKLE) 125 MG capsule Take 125 mg by mouth 2 (two) times daily.  Marland Kitchen escitalopram (LEXAPRO) 10 MG tablet Take 10 mg by mouth every morning. For depression  . famotidine (PEPCID) 20 MG tablet Take 20 mg by mouth 2 (two) times daily.   Marland Kitchen levothyroxine (SYNTHROID) 50 MCG tablet Take 50 mcg by mouth daily before breakfast.  . meloxicam (MOBIC) 7.5 MG tablet Take 7.5 mg by mouth daily as needed for pain.  Marland Kitchen NUTRITIONAL SUPPLEMENT LIQD Take by mouth. Med Pass 2.0 120 ml daily  . OXYGEN Inhale 2 L into the lungs as needed. To maintain oxygen saturation greater than 90 % and shortness of breath  . polyethylene glycol (MIRALAX / GLYCOLAX) 17 g packet Take 17 g by mouth daily.  Marland Kitchen Propylene Glycol 0.6 % SOLN Apply 1 drop to eye 2 (two) times daily.   . sennosides-docusate sodium (SENOKOT-S) 8.6-50 MG tablet Take 1 tablet by mouth at bedtime. For constipation  . traMADol (ULTRAM) 50 MG tablet Take 50 mg by mouth 2 (two) times daily. And 50 mg bid prn   No facility-administered encounter medications on file as of 01/08/2020.     PHYSICAL EXAM / ROS:   Current and past weights: 138 lbs, 15 lb wt loss in 1 year.(11%) General: NAD, frail appearing, thin Cardiovascular: no chest pain reported, no edema in LE, S1S2, irreg rate Pulmonary: lungs cta, no cough, no increased SOB, oxygen at 2 L /[place] Abdomen: appetite decreasing, eating less but still averaging 50 %.Albumin late May 2021  was 3.1.  + bowel sounds, soft non tender abdomen Prior had eaten 100% for some time, denies constipation, incontinent of bowel GU: denies dysuria, incontinent of  urine MSK:  ++ joint and ROM abnormalities, non ambulatory, PD contractures, rigidity Skin: squamous cell ca open wound on back, 6 " round and draining  Neurological: Weakness, non verbal, PD deficits, rigid, UE contractures  Jason Coop, NP , DNP, MPH, [facility]  COVID-19 PATIENT SCREENING TOOL  Person answering questions: ____________Staff_______ _____   1.  Is the patient or any family member in the home showing any signs or symptoms regarding respiratory infection?               Person with Symptom- __________NA_________________  a. Fever  Yes___ No___          ___________________  b. Shortness of breath                                                    Yes___ No___          ___________________ c. Cough/congestion                                       Yes___  No___         ___________________ d. Body aches/pains                                                         Yes___ No___        ____________________ e. Gastrointestinal symptoms (diarrhea, nausea)           Yes___ No___        ____________________  2. Within the past 14 days, has anyone living in the home had any contact with someone with or under investigation for COVID-19?    Yes___ No_X_   Person __________________

## 2020-01-09 ENCOUNTER — Telehealth: Payer: Self-pay | Admitting: Primary Care

## 2020-01-09 NOTE — Telephone Encounter (Signed)
T/c back to daughter who had called me RE pain management. She still feels she is in a lot of pain and uncontrolled on current regimen. We discussed hospice referral for increased pain management. Daughter also states her teeth are rotten on the top and rotted out and have fallen out which may be painful.  Daughter states she has lewy body dementia as well. We discussed a basic human right to have pain managed. She is distressed by continual occurrences of squamous cell ca. She is still wanting hospice services. I have also put in a call to [facility] to obtain wound measurements for ongoing documentation. Current size is 9 cm x 9 cm x 1.5 cm per SNF staff. Initial lesion was a "small raised red bump", first biopsy on 02/08/19 by inhouse wound management MD.

## 2020-01-20 ENCOUNTER — Telehealth: Payer: Self-pay | Admitting: Primary Care

## 2020-01-20 NOTE — Telephone Encounter (Signed)
T/c from nursing home, patient passed away on January 31, 2020.

## 2020-02-12 DEATH — deceased
